# Patient Record
Sex: Male | Born: 1950 | Race: Black or African American | Hispanic: No | Marital: Single | Smoking: Current every day smoker
Health system: Southern US, Community
[De-identification: ages and names within clinical notes are randomized; demographics above are authoritative.]

## PROBLEM LIST (undated history)

## (undated) DIAGNOSIS — M549 Dorsalgia, unspecified: Secondary | ICD-10-CM

## (undated) DIAGNOSIS — I10 Essential (primary) hypertension: Secondary | ICD-10-CM

## (undated) HISTORY — PX: BACK SURGERY: SHX140

## (undated) HISTORY — PX: TOTAL HIP ARTHROPLASTY: SHX124

## (undated) HISTORY — PX: COLONOSCOPY: SHX174

---

## 2014-10-05 ENCOUNTER — Encounter (HOSPITAL_COMMUNITY): Payer: Self-pay | Admitting: *Deleted

## 2014-10-05 ENCOUNTER — Emergency Department (HOSPITAL_COMMUNITY)
Admission: EM | Admit: 2014-10-05 | Discharge: 2014-10-05 | Disposition: A | Discharge status: Home / Self Care | Payer: Medicaid - Out of State | Attending: Emergency Medicine | Admitting: Emergency Medicine

## 2014-10-05 ENCOUNTER — Emergency Department (HOSPITAL_COMMUNITY): Payer: Medicaid - Out of State

## 2014-10-05 DIAGNOSIS — Z8781 Personal history of (healed) traumatic fracture: Secondary | ICD-10-CM | POA: Insufficient documentation

## 2014-10-05 DIAGNOSIS — I10 Essential (primary) hypertension: Secondary | ICD-10-CM | POA: Diagnosis not present

## 2014-10-05 DIAGNOSIS — Z720 Tobacco use: Secondary | ICD-10-CM | POA: Insufficient documentation

## 2014-10-05 DIAGNOSIS — Z9889 Other specified postprocedural states: Secondary | ICD-10-CM | POA: Insufficient documentation

## 2014-10-05 DIAGNOSIS — M549 Dorsalgia, unspecified: Secondary | ICD-10-CM | POA: Diagnosis present

## 2014-10-05 DIAGNOSIS — M545 Low back pain, unspecified: Secondary | ICD-10-CM

## 2014-10-05 HISTORY — DX: Dorsalgia, unspecified: M54.9

## 2014-10-05 HISTORY — DX: Essential (primary) hypertension: I10

## 2014-10-05 MED ORDER — DIAZEPAM 5 MG PO TABS
5.0000 mg | ORAL_TABLET | Freq: Once | ORAL | Status: AC
  Administered 2014-10-05: 5 mg via ORAL
  Filled 2014-10-05: qty 1

## 2014-10-05 MED ORDER — IBUPROFEN 600 MG PO TABS: 600.0000 mg | ORAL_TABLET | Freq: Three times a day (TID) | ORAL | Status: DC | PRN

## 2014-10-05 MED ORDER — METHOCARBAMOL 500 MG PO TABS: 500.0000 mg | ORAL_TABLET | Freq: Three times a day (TID) | ORAL | Status: DC | PRN

## 2014-10-05 MED ORDER — IBUPROFEN 200 MG PO TABS
600.0000 mg | ORAL_TABLET | Freq: Once | ORAL | Status: AC
  Administered 2014-10-05: 600 mg via ORAL
  Filled 2014-10-05: qty 3

## 2014-10-05 MED ORDER — OXYCODONE-ACETAMINOPHEN 5-325 MG PO TABS
1.0000 | ORAL_TABLET | Freq: Once | ORAL | Status: DC
  Filled 2014-10-05: qty 1

## 2014-10-05 NOTE — ED Provider Notes (Signed)
CSN: [id number]     Arrival date & time 10/05/14  1004 History   First MD Initiated Contact with Patient 10/05/14 1016     Chief Complaint  Patient presents with  . Back Pain      HPI Patient presents with increasing back pain over the past 2 weeks.  He states his pain is more severe on the right.  There is some radiation of his pain down towards his right sciatic notch.  He has not tried any medications for the pain but reports pain with ambulation.  He denies fevers and chills.  He states 2013 he had a lumbar fusion surgery done at an outside hospital.   Past Medical History  Diagnosis Date  . Hypertension   . Back pain    Past Surgical History  Procedure Laterality Date  . Back surgery    . Total hip arthroplasty      bil   No family history on file. History  Substance Use Topics  . Smoking status: Current Every Day Smoker  . Smokeless tobacco: Not on file  . Alcohol Use: No    Review of Systems  All other systems reviewed and are negative.     Allergies  Review of patient's allergies indicates no known allergies.  Home Medications   Prior to Admission medications   Not on File   BP 153/93 mmHg  Pulse 67  Temp(Src) 98.7 F (37.1 C) (Oral)  Resp 18  Ht  (1.702 m)  Wt 205 lb (92.987 kg)  BMI 32.10 kg/m2  SpO2 95% Physical Exam  Constitutional: He is oriented to person, place, and time. He appears well-developed and well-nourished.  HENT:  Head: Normocephalic and atraumatic.  Eyes: EOM are normal.  Neck: Normal range of motion.  Cardiovascular: Normal rate, regular rhythm, normal heart sounds and intact distal pulses.   Pulmonary/Chest: Effort normal and breath sounds normal. No respiratory distress.  Abdominal: Soft. He exhibits no distension. There is no tenderness.  Musculoskeletal: Normal range of motion.  Mild tenderness of the right paralumbar region with spasm.  Normal strength in bilateral arms and legs.  Neurological: He is alert and  oriented to person, place, and time.  Skin: Skin is warm and dry.  Psychiatric: He has a normal mood and affect. Judgment normal.  Nursing note and vitals reviewed.   ED Course  Procedures (including critical care time) Labs Review Labs Reviewed - No data to display  Imaging Review Dg Lumbar Spine Complete  10/05/2014   CLINICAL DATA:  Initial encounter for 2 week history of right-sided low back pain radiating over towards the left hip.  EXAM: LUMBAR SPINE - COMPLETE 4+ VIEW  COMPARISON:  None.  FINDINGS: Four views study shows no fracture. No subluxation. Patient is status post posterior diskectomy and interbody fusion at L4-5. One of the L5 pedicle screws appears fractured on the lateral projection although the 2 screws are directly superimposed so laterality of the fractured screw cannot be determined. Bones are diffusely demineralized  IMPRESSION: No evidence for an acute fracture.  No subluxation.  Probable fracture of the L5 pedicle screw seen only on the lateral projection. Laterality of the screw defect cannot be determined on this study.   Electronically Signed   By: Kennith Center M.D.   On: 10/05/2014 11:53     EKG Interpretation None      MDM   Final diagnoses:  None    Suspect musculoskeletal back pain.  He does have a  noted probable fracture of his L5 pedicle screw however it appears as though the screw is otherwise located.  He has no distal neurologic symptoms.  Plan will be to discharge home with pain medicine, and I spasm medicine, primary care and neurosurgical follow-up.  He understands return to the ER for new or worsening symptoms.  No indication for advanced imaging.  Doubt infection.  Marland Kitchen.  Lyanne CoKevin M Kathlynn Swofford, MD 10/05/14 [phone]

## 2014-10-05 NOTE — Discharge Instructions (Signed)
Back Pain, Adult Low back pain is very common. About 1 in 5 people have back pain.The cause of low back pain is rarely dangerous. The pain often gets better over time.About half of people with a sudden onset of back pain feel better in just 2 weeks. About 8 in 10 people feel better by 6 weeks.  CAUSES Some common causes of back pain include:  Strain of the muscles or ligaments supporting the spine.  Wear and tear (degeneration) of the spinal discs.  Arthritis.  Direct injury to the back. DIAGNOSIS Most of the time, the direct cause of low back pain is not known.However, back pain can be treated effectively even when the exact cause of the pain is unknown.Answering your caregiver's questions about your overall health and symptoms is one of the most accurate ways to make sure the cause of your pain is not dangerous. If your caregiver needs more information, he or she may order lab work or imaging tests (X-rays or MRIs).However, even if imaging tests show changes in your back, this usually does not require surgery. HOME CARE INSTRUCTIONS For many people, back pain returns.Since low back pain is rarely dangerous, it is often a condition that people can learn to manageon their own.   Remain active. It is stressful on the back to sit or stand in one place. Do not sit, drive, or stand in one place for more than 30 minutes at a time. Take short walks on level surfaces as soon as pain allows.Try to increase the length of time you walk each day.  Do not stay in bed.Resting more than 1 or 2 days can delay your recovery.  Do not avoid exercise or work.Your body is made to move.It is not dangerous to be active, even though your back may hurt.Your back will likely heal faster if you return to being active before your pain is gone.  Pay attention to your body when you bend and lift. Many people have less discomfortwhen lifting if they bend their knees, keep the load close to their bodies,and  avoid twisting. Often, the most comfortable positions are those that put less stress on your recovering back.  Find a comfortable position to sleep. Use a firm mattress and lie on your side with your knees slightly bent. If you lie on your back, put a pillow under your knees.  Only take over-the-counter or prescription medicines as directed by your caregiver. Over-the-counter medicines to reduce pain and inflammation are often the most helpful.Your caregiver may prescribe muscle relaxant drugs.These medicines help dull your pain so you can more quickly return to your normal activities and healthy exercise.  Put ice on the injured area.  Put ice in a plastic bag.  Place a towel between your skin and the bag.  Leave the ice on for 15-20 minutes, 03-04 times a day for the first 2 to 3 days. After that, ice and heat may be alternated to reduce pain and spasms.  Ask your caregiver about trying back exercises and gentle massage. This may be of some benefit.  Avoid feeling anxious or stressed.Stress increases muscle tension and can worsen back pain.It is important to recognize when you are anxious or stressed and learn ways to manage it.Exercise is a great option. SEEK MEDICAL CARE IF:  You have pain that is not relieved with rest or medicine.  You have pain that does not improve in 1 week.  You have new symptoms.  You are generally not feeling well. SEEK   IMMEDIATE MEDICAL CARE IF:   You have pain that radiates from your back into your legs.  You develop new bowel or bladder control problems.  You have unusual weakness or numbness in your arms or legs.  You develop nausea or vomiting.  You develop abdominal pain.  You feel faint. Document Released: 09/02/2005 Document Revised: 03/03/2012 Document Reviewed: 01/04/2014 Exit[employer] Patient Information 2015 [employer]. This information is not intended to replace advice given to you by your health care provider. Make sure you  discuss any questions you have with your health care provider.  

## 2014-10-05 NOTE — ED Notes (Signed)
Patient has been to xray.  Remains alert and oriented.

## 2014-10-05 NOTE — ED Notes (Signed)
Patient has hx of back surgery 2013.  He has had increasing back pain for 2 weeks.  Patient denies any recent trauma.  Patient was unable to walk today due to pain.  EMS called to home for transport.  Patient has not taken any meds for pain today.  Patient is tearful with any movement.  Patient is alert and oriented.  Patient with no loss of control of bowel or bladder.

## 2016-09-10 ENCOUNTER — Encounter (HOSPITAL_COMMUNITY): Payer: Self-pay | Admitting: Emergency Medicine

## 2016-09-10 ENCOUNTER — Ambulatory Visit (HOSPITAL_COMMUNITY)
Admission: EM | Admit: 2016-09-10 | Discharge: 2016-09-10 | Disposition: A | Discharge status: Home / Self Care | Payer: Medicaid Other | Attending: Emergency Medicine | Admitting: Emergency Medicine

## 2016-09-10 ENCOUNTER — Emergency Department (HOSPITAL_COMMUNITY)
Admission: EM | Admit: 2016-09-10 | Discharge: 2016-09-11 | Disposition: A | Discharge status: Home / Self Care | Payer: Self-pay | Attending: Emergency Medicine | Admitting: Emergency Medicine

## 2016-09-10 ENCOUNTER — Emergency Department (HOSPITAL_COMMUNITY): Payer: Self-pay

## 2016-09-10 DIAGNOSIS — R509 Fever, unspecified: Secondary | ICD-10-CM | POA: Diagnosis not present

## 2016-09-10 DIAGNOSIS — N451 Epididymitis: Secondary | ICD-10-CM | POA: Insufficient documentation

## 2016-09-10 DIAGNOSIS — Z79899 Other long term (current) drug therapy: Secondary | ICD-10-CM | POA: Insufficient documentation

## 2016-09-10 DIAGNOSIS — N50819 Testicular pain, unspecified: Secondary | ICD-10-CM

## 2016-09-10 DIAGNOSIS — N50811 Right testicular pain: Secondary | ICD-10-CM | POA: Diagnosis not present

## 2016-09-10 DIAGNOSIS — F17200 Nicotine dependence, unspecified, uncomplicated: Secondary | ICD-10-CM | POA: Insufficient documentation

## 2016-09-10 DIAGNOSIS — I10 Essential (primary) hypertension: Secondary | ICD-10-CM | POA: Insufficient documentation

## 2016-09-10 DIAGNOSIS — R935 Abnormal findings on diagnostic imaging of other abdominal regions, including retroperitoneum: Secondary | ICD-10-CM | POA: Insufficient documentation

## 2016-09-10 LAB — URINALYSIS, ROUTINE W REFLEX MICROSCOPIC
Bilirubin Urine: NEGATIVE
Glucose, UA: NEGATIVE mg/dL
KETONES UR: NEGATIVE mg/dL
NITRITE: POSITIVE — AB
Protein, ur: 30 mg/dL — AB
Specific Gravity, Urine: 1.019 (ref 1.005–1.030)
pH: 5 (ref 5.0–8.0)

## 2016-09-10 LAB — CBC WITH DIFFERENTIAL/PLATELET
BASOS ABS: 0 10*3/uL (ref 0.0–0.1)
BASOS PCT: 0 %
Eosinophils Absolute: 0 10*3/uL (ref 0.0–0.7)
Eosinophils Relative: 0 %
HCT: 43.4 % (ref 39.0–52.0)
HEMOGLOBIN: 14.6 g/dL (ref 13.0–17.0)
LYMPHS ABS: 2.1 10*3/uL (ref 0.7–4.0)
LYMPHS PCT: 9 %
MCH: 29.6 pg (ref 26.0–34.0)
MCHC: 33.6 g/dL (ref 30.0–36.0)
MCV: 88 fL (ref 78.0–100.0)
Monocytes Absolute: 1.7 10*3/uL — ABNORMAL HIGH (ref 0.1–1.0)
Monocytes Relative: 7 %
NEUTROS ABS: 19.9 10*3/uL — AB (ref 1.7–7.7)
Neutrophils Relative %: 84 %
Platelets: 241 10*3/uL (ref 150–400)
RBC: 4.93 MIL/uL (ref 4.22–5.81)
RDW: 14.7 % (ref 11.5–15.5)
WBC: 23.7 10*3/uL — ABNORMAL HIGH (ref 4.0–10.5)

## 2016-09-10 LAB — BASIC METABOLIC PANEL
ANION GAP: 9 (ref 5–15)
BUN: 13 mg/dL (ref 6–20)
CO2: 21 mmol/L — ABNORMAL LOW (ref 22–32)
Calcium: 9 mg/dL (ref 8.9–10.3)
Chloride: 104 mmol/L (ref 101–111)
Creatinine, Ser: 1.23 mg/dL (ref 0.61–1.24)
GFR calc Af Amer: >60 mL/min (ref >60)
Glucose, Bld: 115 mg/dL — ABNORMAL HIGH (ref 65–99)
POTASSIUM: 3.8 mmol/L (ref 3.5–5.1)
Sodium: 134 mmol/L — ABNORMAL LOW (ref 135–145)

## 2016-09-10 LAB — I-STAT CG4 LACTIC ACID, ED: Lactic Acid, Venous: 1.33 mmol/L (ref 0.5–1.9)

## 2016-09-10 MED ORDER — ACETAMINOPHEN 325 MG PO TABS
ORAL_TABLET | ORAL | Status: AC
  Filled 2016-09-10: qty 1

## 2016-09-10 MED ORDER — KETOROLAC TROMETHAMINE 30 MG/ML IJ SOLN
30.0000 mg | Freq: Once | INTRAMUSCULAR | Status: AC
  Administered 2016-09-10: 30 mg via INTRAVENOUS
  Filled 2016-09-10: qty 1

## 2016-09-10 MED ORDER — DEXTROSE 5 % IV SOLN
1.0000 g | Freq: Once | INTRAVENOUS | Status: AC
  Administered 2016-09-10: 1 g via INTRAVENOUS
  Filled 2016-09-10: qty 10

## 2016-09-10 MED ORDER — NAPROXEN 500 MG PO TABS: 500.0000 mg | ORAL_TABLET | Freq: Two times a day (BID) | ORAL | 0 refills | Status: DC | PRN

## 2016-09-10 MED ORDER — ACETAMINOPHEN 325 MG PO TABS
650.0000 mg | ORAL_TABLET | Freq: Once | ORAL | Status: AC
  Administered 2016-09-10: 650 mg via ORAL

## 2016-09-10 MED ORDER — LEVOFLOXACIN 500 MG PO TABS: 500.0000 mg | ORAL_TABLET | Freq: Every day | ORAL | 0 refills | Status: DC

## 2016-09-10 MED ORDER — IOPAMIDOL (ISOVUE-300) INJECTION 61%
INTRAVENOUS | Status: AC
  Administered 2016-09-10: 100 mL
  Filled 2016-09-10: qty 100

## 2016-09-10 MED ORDER — SODIUM CHLORIDE 0.9 % IV BOLUS (SEPSIS)
1000.0000 mL | Freq: Once | INTRAVENOUS | Status: AC
  Administered 2016-09-10: 1000 mL via INTRAVENOUS

## 2016-09-10 MED ORDER — ACETAMINOPHEN 325 MG PO TABS
ORAL_TABLET | ORAL | Status: AC
  Filled 2016-09-10: qty 2

## 2016-09-10 NOTE — ED Provider Notes (Signed)
[facility]    CSN: [id number] Arrival date & time: 09/10/16  1455     History   Chief Complaint Chief Complaint  Patient presents with  . Back Pain  . Testicle Pain    HPI Austin [name] is a 65 y.o. male.   HPI  He is a 65 year old man here with his wife for evaluation of right low back and right testicular pain. This started about 3 days ago and has gradually been getting worse. Last night, he felt feverish and had vomiting. He's had some intermittent nausea since that time, but no further vomiting. Pain is located in the right lower back, right lower abdomen, and right groin and testicle. He denies any dysuria or penile drainage. No change in the color of his urine. No new sexual partners. He is unable to flex the hip without pain. He is also unable to stand upright due to the pain.  Past Medical History:  Diagnosis Date  . Back pain   . Hypertension     There are no active problems to display for this patient.   Past Surgical History:  Procedure Laterality Date  . BACK SURGERY    . TOTAL HIP ARTHROPLASTY     bil       Home Medications    Prior to Admission medications   Not on File    Family History History reviewed. No pertinent family history.  Social History Social History  Substance Use Topics  . Smoking status: Current Every Day Smoker  . Smokeless tobacco: Not on file  . Alcohol use No     Allergies   Patient has no known allergies.   Review of Systems Review of Systems As in history of present illness  Physical Exam Triage Vital Signs ED Triage Vitals  Enc Vitals Group     BP 09/10/16 1535 149/70     Pulse Rate 09/10/16 1535 108     Resp 09/10/16 1535 20     Temp 09/10/16 1535 102.6 F (39.2 C)     Temp Source 09/10/16 1535 Oral     SpO2 09/10/16 1535 98 %     Weight --      Height --      Head Circumference --      Peak Flow --      Pain Score 09/10/16 1538 10     Pain Loc --      Pain Edu? --      Excl. in  GC? --    No data found.   Updated Vital Signs BP 149/70 (BP Location: Right Arm)   Pulse 108   Temp 102.6 F (39.2 C) (Oral)   Resp 20   SpO2 98%   Visual Acuity Right Eye Distance:   Left Eye Distance:   Bilateral Distance:    Right Eye Near:   Left Eye Near:    Bilateral Near:     Physical Exam  Constitutional: He is oriented to person, place, and time. He appears well-developed and well-nourished. He appears distressed.  Cardiovascular: Normal rate.   Pulmonary/Chest: Effort normal.  Abdominal: Soft. He exhibits no mass. There is tenderness (RLQ). There is no rebound and no guarding. Hernia confirmed negative in the right inguinal area (tender over inguinal canal).  Genitourinary: Right testis shows swelling and tenderness (exquisite). Left testis shows no swelling and no tenderness.  Musculoskeletal:  Back: No erythema or edema. No vertebral tenderness or step-offs. No muscle spasm. He is tender in the  right lateral lumbar area.  Neurological: He is alert and oriented to person, place, and time.     UC Treatments / Results  Labs (all labs ordered are listed, but only abnormal results are displayed) Labs Reviewed - No data to display  EKG  EKG Interpretation None       Radiology No results found.  Procedures Procedures (including critical care time)  Medications Ordered in UC Medications  acetaminophen (TYLENOL) tablet 650 mg (650 mg Oral Given 09/10/16 1557)     Initial Impression / Assessment and Plan / UC Course  I have reviewed the triage vital signs and the nursing notes.  Pertinent labs & imaging results that were available during my care of the patient were reviewed by me and considered in my medical decision making (see chart for details).  Clinical Course     Given his pain and fever, concern for infection. Appendicitis, psoas abscess, epididymitis, and incarcerated hernia are all possible. [facility] and evaluation. Tylenol given prior to transfer.  Final Clinical Impressions(s) / UC Diagnoses   Final diagnoses:  Right testicular pain  Fever, unspecified fever cause    New Prescriptions New Prescriptions   No medications on file     Charm RingsErin J Harshal Sirmon, MD 09/10/16 [phone]

## 2016-09-10 NOTE — Discharge Instructions (Signed)
It was my pleasure taking care of you today!  Please take all of your antibiotics until finished! Take naproxen as needed for pain. Put a cold gel packs or bag of ice on the area every few hours for 15 minutes each time for additional pain relief. You were tested for gonorrhea and chlamydia today - you will be notified in approximately 3 days if these results are positive. Follow up with your primary care provider for recheck of symptoms in one week. If you are not feeling better in 2-3 days, please see your regular doctor or return to ER for recheck. Return to ER for fevers not controlled with tylenol/ibuprofen, new or worsening symptoms, any additional concerns.

## 2016-09-10 NOTE — ED Triage Notes (Signed)
[facility] a [facility] of lower back pain and right testicle tenderness x 3 days. [facility] a fever [facility] 102.6 orally and has not taken any antipyretics.

## 2016-09-10 NOTE — ED Provider Notes (Signed)
[facility]   CSN: [id number] Arrival date & time: 09/10/16  1610     History   Chief Complaint Chief Complaint  Patient presents with  . Testicle Pain  . Fever    HPI Austin [name] is a 65 y.o. male.  The history is provided by the patient and medical records. No language interpreter was used.  Testicle Pain  Pertinent negatives include no abdominal pain and no shortness of breath.  Fever   Associated symptoms include vomiting (1x). Pertinent negatives include no congestion and no cough.   Austin [name] is a 65 y.o. male  with a PMH of HTN who presents to the Emergency Department complaining of right low back pain and right testicular pain. Patient states that about 3 days ago, he started noticing a dull pain to his right low back. Pain gradually worsened and the next day he had pain to his right testicle. He took an aspirin with little relief. At 2 AM, he awoke with nausea and had one episode of emesis. He states that he felt a little bit better after throwing up, but was still experiencing right testicular pain. Pain is worse with palpation. He can sometimes manage to find a certain position which is more comfortable than others. He has felt feverish for the last 2-3 days as well. He was seen by the urgent care earlier today who referred him to the emergency department for further workup. He was febrile 102 at urgent care and was given Tylenol. Patient denies dysuria, urinary hesitancy, urinary frequency, penile discharge.  Past Medical History:  Diagnosis Date  . Back pain   . Hypertension     There are no active problems to display for this patient.   Past Surgical History:  Procedure Laterality Date  . BACK SURGERY    . TOTAL HIP ARTHROPLASTY     bil       Home Medications    Prior to Admission medications   Medication Sig Start Date End Date Taking? Authorizing Provider  levofloxacin (LEVAQUIN) 500 MG tablet Take 1 tablet (500 mg total)  by mouth daily. 09/10/16   Chase Picket Deyvi Bonanno, PA-C  naproxen (NAPROSYN) 500 MG tablet Take 1 tablet (500 mg total) by mouth 2 (two) times daily as needed for moderate pain. 09/10/16   Chase Picket Curtina Grills, PA-C    Family History No family history on file.  Social History Social History  Substance Use Topics  . Smoking status: Current Every Day Smoker  . Smokeless tobacco: Not on file  . Alcohol use No     Allergies   Patient has no known allergies.   Review of Systems Review of Systems  Constitutional: Positive for appetite change (Decreased), chills and fever.  HENT: Negative for congestion.   Eyes: Negative for visual disturbance.  Respiratory: Negative for cough and shortness of breath.   Cardiovascular: Negative.   Gastrointestinal: Positive for nausea and vomiting (1x). Negative for abdominal pain.  Genitourinary: Positive for testicular pain. Negative for discharge, dysuria, frequency and penile pain.  Musculoskeletal: Positive for back pain. Negative for neck pain.  Skin: Negative for rash.  Neurological: Negative for weakness.     Physical Exam Updated Vital Signs BP 120/72   Pulse 74   Temp 99.6 F (37.6 C) (Oral)   Resp 18   SpO2 96%   Physical Exam  Constitutional: He is oriented to person, place, and time. He appears well-developed and well-nourished. No distress.  HENT:  Head: Normocephalic and  atraumatic.  Cardiovascular: Normal rate, regular rhythm and normal heart sounds.   No murmur heard. Pulmonary/Chest: Effort normal and breath sounds normal. No respiratory distress.  Abdominal: Soft. Bowel sounds are normal. He exhibits no distension. There is no tenderness.  No CVA tenderness.   Genitourinary:  Genitourinary Comments: Chaperone present for exam. Right testicle exquisitely tender to palpation. No signs of lesion or erythema on the penis or testicles.The penis is nontender. No signs of any inguinal hernias. No signs of any discharge from the  penis.   Neurological: He is alert and oriented to person, place, and time.  Skin: Skin is warm and dry.  Nursing note and vitals reviewed.    ED Treatments / Results  Labs (all labs ordered are listed, but only abnormal results are displayed) Labs Reviewed  CBC WITH DIFFERENTIAL/PLATELET - Abnormal; Notable for the following:       Result Value   WBC 23.7 (*)    Neutro Abs 19.9 (*)    Monocytes Absolute 1.7 (*)    All other components within normal limits  URINALYSIS, ROUTINE W REFLEX MICROSCOPIC - Abnormal; Notable for the following:    APPearance CLOUDY (*)    Hgb urine dipstick MODERATE (*)    Protein, ur 30 (*)    Nitrite POSITIVE (*)    Leukocytes, UA LARGE (*)    Bacteria, UA MANY (*)    Squamous Epithelial / LPF 0-5 (*)    Crystals PRESENT (*)    All other components within normal limits  BASIC METABOLIC PANEL - Abnormal; Notable for the following:    Sodium 134 (*)    CO2 21 (*)    Glucose, Bld 115 (*)    All other components within normal limits  CULTURE, BLOOD (ROUTINE X 2)  CULTURE, BLOOD (ROUTINE X 2)  URINE CULTURE  I-STAT CG4 LACTIC ACID, ED  GC/CHLAMYDIA PROBE AMP ([place]) [facility]    EKG  EKG Interpretation None       Radiology Ct Abdomen Pelvis W Contrast  Result Date: 09/10/2016 CLINICAL DATA:  Testicular pain for 3 days. EXAM: CT ABDOMEN AND PELVIS WITH CONTRAST TECHNIQUE: Multidetector CT imaging of the abdomen and pelvis was performed using the standard protocol following bolus administration of intravenous contrast. CONTRAST:  100 cc Isovue-300 IV COMPARISON:  None. FINDINGS: Lower chest: Motion limited evaluation. Minimal dependent atelectasis. No pleural fluid. Coronary artery calcifications are seen. Hepatobiliary: No focal liver abnormality is seen. No gallstones, gallbladder wall thickening, or biliary dilatation. Pancreas: Unremarkable. No pancreatic ductal dilatation or surrounding inflammatory changes. Spleen: Normal in size  without focal abnormality. Adrenals/Urinary Tract: Mild left adrenal thickening without dominant nodule. Normal right adrenal gland. No hydronephrosis or perinephric edema. Symmetric renal enhancement and excretion on delayed phase imaging. No evidence of urolithiasis, small renal stones may be obscured by IV contrast. Urinary bladder is grossly unremarkable, partially obscured by streak artifact from bilateral hip prostheses. Stomach/Bowel: Stomach is within normal limits. Appendix tentatively identified and normal. No evidence of bowel wall thickening, distention, or inflammatory changes. Sigmoid colonic tortuosity. Vascular/Lymphatic: Advanced calcified and noncalcified atheromatous plaque throughout the abdominal aorta. 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 Distal reconstitution is suspected at the level of the SFA. Portions of pelvic vasculature obscured by streak artifact from surgical hardware. Prominent pampiniform plexus in the right hemiscrotum. The IVC is unremarkable. Borderline enlarged node at the celiac origin measuring 8 mm short axis. Reproductive:  Right hydrocele. Prostate gland is not well evaluated secondary to streak artifact. Other: No free air or free fluid. Musculoskeletal: Post bilateral hip arthroplasties. Posterior fusion L4-L5. Adjacent level disease at L3-L4. IMPRESSION: 1. Right scrotal hydrocele. Prominent right pampiniform plexus. Findings can be seen in the setting of epididymal orchitis. Collateral vessels due to right iliac artery occlusion felt less likely. Scrotal ultrasound could be considered for more detailed evaluation of testicular pathology. 2. 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 There is distal reconstitution suspected at the SFA, however streak artifact from hip prostheses obscures evaluation. Electronically Signed   By: Rubye OaksMelanie   Ehinger M.D.   On: 09/10/2016 23:08    Procedures Procedures (including critical care time)  Medications Ordered in ED Medications  sodium chloride 0.9 % bolus 1,000 mL (0 mLs Intravenous Stopped 09/10/16 2207)  cefTRIAXone (ROCEPHIN) 1 g in dextrose 5 % 50 mL IVPB (0 g Intravenous Stopped 09/10/16 2207)  ketorolac (TORADOL) 30 MG/ML injection 30 mg (30 mg Intravenous Given 09/10/16 2119)  iopamidol (ISOVUE-300) 61 % injection (100 mLs  Contrast Given 09/10/16 2225)     Initial Impression / Assessment and Plan / ED Course  I have reviewed the triage vital signs and the nursing notes.  Pertinent labs & imaging results that were available during my care of the patient were reviewed by me and considered in my medical decision making (see chart for details).  Clinical Course    Doran DurandStephen Radney is a 65 y.o. male who presents to ED from urgent care for further evaluation of right testicular pain and right low back pain. On exam, patient is non-toxic appearing with significant tenderness to the right testicle. No abdominal or CVA tenderness. Labs reviewed: white count elevated at 23.7 with shift of 19.9. Sodium of 134, CO2 21. Urine nitrite + with large leukocytes and too numerous to count white cells. Lactic normal. Case discussed with Dr. Erma HeritageIsaacs - will obtain CT abd/pelvis for further evaluation. Urine and blood cultures obtained. Will also start patient on Rocephin and fluids. Toradol given for pain. CT shows findings c/w epididymal orchitis which is c/w history and exam as well. Discussed results with attending who reviewed CT as well. Discussed treatment  vs. obtaining further ultrasound and patient would like to defer ultrasound at this time. G&C obtained. Will treat with levaquin. PCP follow up encouraged. Strict return precautions discussed with patient and significant other at bedside who express agreement with plan as dictated above. All questions answered.   Final Clinical Impressions(s) /  ED Diagnoses   Final diagnoses:  Testicular pain  Epididymitis    New Prescriptions Discharge Medication List as of 09/10/2016 11:55 PM    START taking these medications   Details  levofloxacin (LEVAQUIN) 500 MG tablet Take 1 tablet (500 mg total) by mouth daily., Starting Tue 09/10/2016, Print    naproxen (NAPROSYN) 500 MG tablet Take 1 tablet (500 mg total) by mouth 2 (two) times daily as needed for moderate pain., Starting Tue 09/10/2016, Print         CIT GroupJaime Pilcher Hudsyn Champine, PA-C 09/11/16 [id number]    Shaune Pollackameron Isaacs, MD 09/11/16 1149

## 2016-09-10 NOTE — Discharge Instructions (Signed)
With the tenderness and swelling of the testicle and your fever, I'm concerned about infection. The best place to have this evaluated is in the emergency room where they can do some imaging such as an ultrasound or a CT scan.

## 2016-09-10 NOTE — ED Triage Notes (Signed)
Pt sent here from urgent care for evaluation of 4 days of right sided testicular pain. Denies penile discharge. Reports he had a temp of 102 at UC and was given tylenol. Temp normal at triage. PT also reports a headache.

## 2016-09-11 LAB — GC/CHLAMYDIA PROBE AMP (~~LOC~~) NOT AT ARMC
CHLAMYDIA, DNA PROBE: NEGATIVE
Neisseria Gonorrhea: NEGATIVE

## 2016-09-11 NOTE — ED Notes (Signed)
Pt stable, understands discharge instructions, and reasons for return.   

## 2016-09-13 LAB — URINE CULTURE

## 2016-09-14 ENCOUNTER — Telehealth: Payer: Self-pay

## 2016-09-14 NOTE — Telephone Encounter (Signed)
Post ED Visit - Positive Culture Follow-up  Culture report reviewed by antimicrobial stewardship pharmacist:  []  Austin [name], Pharm.[name]. []  Austin [name], Pharm.[name]., BCPS []  Austin [name], Pharm.[name]. []  Austin [name], Pharm.[name]., BCPS []  Austin [name], [place][name]., BCPS, AAHIVP []  Austin [name], Pharm.[name]., BCPS, AAHIVP []  Austin [name], Pharm.[name]. []  Austin [name], Austin [name] Positive urine culture Treated with Levofloxaxin, organism sensitive to the same and no further patient follow-up is required at this time.  Austin [name], Austin [name] 09/14/2016, 9:49 AM

## 2016-09-15 LAB — CULTURE, BLOOD (ROUTINE X 2)
Culture: NO GROWTH
Culture: NO GROWTH

## 2017-09-29 ENCOUNTER — Emergency Department (HOSPITAL_COMMUNITY): Payer: Medicare Other

## 2017-09-29 ENCOUNTER — Emergency Department (HOSPITAL_COMMUNITY)
Admission: EM | Admit: 2017-09-29 | Discharge: 2017-09-29 | Disposition: A | Discharge status: Home / Self Care | Payer: Medicare Other | Attending: Emergency Medicine | Admitting: Emergency Medicine

## 2017-09-29 ENCOUNTER — Encounter (HOSPITAL_COMMUNITY): Payer: Self-pay

## 2017-09-29 DIAGNOSIS — Z96643 Presence of artificial hip joint, bilateral: Secondary | ICD-10-CM | POA: Diagnosis not present

## 2017-09-29 DIAGNOSIS — F17210 Nicotine dependence, cigarettes, uncomplicated: Secondary | ICD-10-CM | POA: Diagnosis not present

## 2017-09-29 DIAGNOSIS — M79675 Pain in left toe(s): Secondary | ICD-10-CM | POA: Insufficient documentation

## 2017-09-29 DIAGNOSIS — M79672 Pain in left foot: Secondary | ICD-10-CM | POA: Diagnosis not present

## 2017-09-29 DIAGNOSIS — S99922A Unspecified injury of left foot, initial encounter: Secondary | ICD-10-CM | POA: Diagnosis not present

## 2017-09-29 DIAGNOSIS — I10 Essential (primary) hypertension: Secondary | ICD-10-CM | POA: Insufficient documentation

## 2017-09-29 MED ORDER — CLOTRIMAZOLE-BETAMETHASONE 1-0.05 % EX CREA: TOPICAL_CREAM | CUTANEOUS | 0 refills | Status: DC

## 2017-09-29 MED ORDER — CEPHALEXIN 500 MG PO CAPS: 500.0000 mg | ORAL_CAPSULE | Freq: Three times a day (TID) | ORAL | 0 refills | Status: DC

## 2017-09-29 MED ORDER — IBUPROFEN 600 MG PO TABS: 600.0000 mg | ORAL_TABLET | Freq: Four times a day (QID) | ORAL | 0 refills | Status: DC | PRN

## 2017-09-29 NOTE — Discharge Instructions (Signed)
It was my pleasure taking care of you today!   Apply cream twice daily as directed.  Follow up with the podiatrist listed or a primary care provider for further evaluation.   Return to ER for new or worsening symptoms, any additional concerns.   To find a primary care or specialty doctor please call [phone] or [phone] to access "[facility] Find a Doctor Service."  You may also go on the [facility] website at [email or web address]  There are also multiple [facility] and [facility] practices throughout the [place] that are frequently accepting new patients. You may find a clinic that is close to your home and contact them.  [facility] - [address] [id number] [phone]  [facility] in [place] (also locations in [place] and [place]) - [address] [place] [phone]  [facility] - [address] [place] [phone]

## 2017-09-29 NOTE — ED Notes (Signed)
Declined W/C at D/C and was escorted to lobby by RN. 

## 2017-09-29 NOTE — ED Provider Notes (Signed)
[facility] EMERGENCY [facility] Provider Note   CSN: [id number] Arrival date & time: 09/29/17  1515     History   Chief Complaint Chief Complaint  Patient presents with  . Toe Injury    HPI Austin [name] is a 67 y.o. male.  The history is provided by the patient and medical records. No language interpreter was used.   Austin [name] is a 67 y.o. male  with a PMH of HTN who presents to the Emergency Department complaining of pain to the left great toe times 1.5 weeks.  Patient states it has been a slight ache and tenderness around the distal nailbed area for the last week or so.  Today, he accidentally ran into the bedpost with his foot and pain acutely worsened.  Does report thickening of the nail and white, dry patches around the webbing of the first 3 toes. Denies hx of similar. No medications prior to arrival for symptoms. No fever, chills, numbness, tingling or weakness.  Past Medical History:  Diagnosis Date  . Back pain   . Hypertension     There are no active problems to display for this patient.   Past Surgical History:  Procedure Laterality Date  . BACK SURGERY    . TOTAL HIP ARTHROPLASTY     bil       Home Medications    Prior to Admission medications   Medication Sig Start Date End Date Taking? Authorizing Provider  cephALEXin (KEFLEX) 500 MG capsule Take 1 capsule (500 mg total) by mouth 3 (three) times daily. 09/29/17   Ward, Chase PicketJaime Pilcher, PA-C  clotrimazole-betamethasone (LOTRISONE) cream Apply to affected area 2 times daily 09/29/17   Ward, Chase PicketJaime Pilcher, PA-C  ibuprofen (ADVIL,MOTRIN) 600 MG tablet Take 1 tablet (600 mg total) by mouth every 6 (six) hours as needed. 09/29/17   Ward, Chase PicketJaime Pilcher, PA-C  levofloxacin (LEVAQUIN) 500 MG tablet Take 1 tablet (500 mg total) by mouth daily. 09/10/16   Ward, Chase PicketJaime Pilcher, PA-C  naproxen (NAPROSYN) 500 MG tablet Take 1 tablet (500 mg total) by mouth 2 (two) times daily as needed for moderate  pain. 09/10/16   Ward, Chase PicketJaime Pilcher, PA-C    Family History No family history on file.  Social History Social History   Tobacco Use  . Smoking status: Current Every Day Smoker  . Smokeless tobacco: Never Used  Substance Use Topics  . Alcohol use: No  . Drug use: Yes    Types: Marijuana     Allergies   Patient has no known allergies.   Review of Systems Review of Systems  Constitutional: Negative for chills and fever.  Musculoskeletal: Positive for arthralgias.  Skin: Positive for color change.  Neurological: Negative for weakness and numbness.     Physical Exam Updated Vital Signs BP (!) 196/97 (BP Location: Right Arm)   Pulse 84   Temp 98 F (36.7 C)   Resp 20   Ht 5\' 7"  (1.702 m)   Wt 88.5 kg (195 lb)   SpO2 95%   BMI 30.54 kg/m   Physical Exam  Constitutional: He appears well-developed and well-nourished. No distress.  HENT:  Head: Normocephalic and atraumatic.  Neck: Neck supple.  Cardiovascular: Normal rate, regular rhythm and normal heart sounds.  No murmur heard. Pulmonary/Chest: Effort normal and breath sounds normal. No respiratory distress. He has no wheezes. He has no rales.  Musculoskeletal:  Tenderness along distal left great toe nailbed. Mild erythema. No swelling. No fluctuance. No abscess /  paronychia. Thickened nails. Full ROM. 2+ DP. Sensation intact.  Neurological: He is alert.  Skin: Skin is warm and dry.  Nursing note and vitals reviewed.    ED Treatments / Results  Labs (all labs ordered are listed, but only abnormal results are displayed) Labs Reviewed - No data to display  EKG  EKG Interpretation None       Radiology Dg Foot Complete Left  Result Date: 09/29/2017 CLINICAL DATA:  Left foot pain.  Toe injury EXAM: LEFT FOOT - COMPLETE 3+ VIEW COMPARISON:  None FINDINGS: Hallux valgus deformity identified. Mild degenerative changes at the first MTP joint. Second through fifth hammertoe deformity identified. IMPRESSION:  1. No acute findings. 2. Hallux valgus deformity 3. Second through fifth hammertoe deformities Electronically Signed   By: Signa Kell M.D.   On: 09/29/2017 17:37    Procedures Procedures (including critical care time)  Medications Ordered in ED Medications - No data to display   Initial Impression / Assessment and Plan / ED Course  I have reviewed the triage vital signs and the nursing notes.  Pertinent labs & imaging results that were available during my care of the patient were reviewed by me and considered in my medical decision making (see chart for details).    Austin [name] is a 67 y.o. male who presents to ED for left great toenail pain x 1.5 weeks. Did hit toe today which exacerbated pain. NVI. X-ray with no acute findings. Tinea on exam. Also has some mild erythema and tenderness at nailfold concerning for early superimposed infection. Will treat and have patient follow up with PCP or podiatry. Info for both provided. Home care instructions and return precautions discussed. All questions answered.   Final Clinical Impressions(s) / ED Diagnoses   Final diagnoses:  Pain of toe of left foot    ED Discharge Orders        Ordered    clotrimazole-betamethasone (LOTRISONE) cream     09/29/17 1809    cephALEXin (KEFLEX) 500 MG capsule  3 times daily     09/29/17 1809    ibuprofen (ADVIL,MOTRIN) 600 MG tablet  Every 6 hours PRN     09/29/17 1814       Ward, Chase Picket, PA-C 09/29/17 1825    Jacalyn Lefevre, MD 09/29/17 1846

## 2017-09-29 NOTE — ED Triage Notes (Signed)
Per Pt, Pt is coming from home with complaints of left toe pain secondary to hitting it on the bed post. Pt reports he had some pain before, but this is worse.

## 2017-10-08 ENCOUNTER — Emergency Department (HOSPITAL_COMMUNITY)
Admission: EM | Admit: 2017-10-08 | Discharge: 2017-10-08 | Disposition: A | Discharge status: Home / Self Care | Payer: Medicare Other | Attending: Emergency Medicine | Admitting: Emergency Medicine

## 2017-10-08 ENCOUNTER — Other Ambulatory Visit: Payer: Self-pay

## 2017-10-08 ENCOUNTER — Encounter (HOSPITAL_COMMUNITY): Payer: Self-pay | Admitting: Emergency Medicine

## 2017-10-08 DIAGNOSIS — M79672 Pain in left foot: Secondary | ICD-10-CM

## 2017-10-08 DIAGNOSIS — I10 Essential (primary) hypertension: Secondary | ICD-10-CM | POA: Diagnosis not present

## 2017-10-08 DIAGNOSIS — B353 Tinea pedis: Secondary | ICD-10-CM | POA: Diagnosis not present

## 2017-10-08 MED ORDER — KETOROLAC TROMETHAMINE 30 MG/ML IJ SOLN
30.0000 mg | Freq: Once | INTRAMUSCULAR | Status: AC
  Administered 2017-10-08: 30 mg via INTRAMUSCULAR
  Filled 2017-10-08: qty 1

## 2017-10-08 MED ORDER — GABAPENTIN 100 MG PO CAPS: 100.0000 mg | ORAL_CAPSULE | Freq: Three times a day (TID) | ORAL | 0 refills | Status: DC

## 2017-10-08 MED ORDER — MELOXICAM 15 MG PO TABS: 15.0000 mg | ORAL_TABLET | Freq: Every day | ORAL | 0 refills | Status: DC

## 2017-10-08 NOTE — Discharge Instructions (Signed)
Perform warm soapy water soaks in Half-strength hydrogen peroxide for 5-10 mins, twice daily x 5-7 days. Use mobic daily as directed, don't use additional NSAIDs like ibuprofen/aleve/etc while taking mobic. Use additional tylenol as needed for additional pain relief.  Use gabapentin as directed for pain. Continue using the cream you were given at your last visit, to continue treating your fungal infection. Please followup with the [facility] wellness center in 1-2 weeks for recheck of symptoms and ongoing medical care. Watch for increasing pain, worsening swelling, spreading redness, drainage/pus, or fever, and return to the emergency department if any of these symptoms occur, or for any other changes/worsening.  Also, your blood pressure was elevated; monitor your blood pressure at home and keep a log to take to your doctor's visit. Eat a low sodium/low salt diet.

## 2017-10-08 NOTE — ED Notes (Signed)
Pt stable, ambulatory, and verbalizes understanding of d/c instructions.  

## 2017-10-08 NOTE — ED Triage Notes (Signed)
Pt to ER for evaluation of left foot pain x1 week; states was seen last week for the same. Mild swelling noted. States toe injury last week. Has been on antibiotics.

## 2017-10-08 NOTE — ED Provider Notes (Signed)
[facility] EMERGENCY DEPARTMENT Provider Note   CSN: [id number] Arrival date & time: 10/08/17  1324     History   Chief Complaint Chief Complaint  Patient presents with  . Foot Pain    HPI Austin [name] is a 67 y.o. male with a PMHx of HTN and chronic back pain, who presents to the ED with complaints of ongoing left toe pain x 2 weeks.  Patient was seen on 09/29/17 and diagnosed with tinea and possible early superimposed infection, was given Lotrisone cream and Keflex.  He has been compliant with these however feels as though his issue continues.  He had an x-ray on the day of his last visit, which was negative.  He describes his pain today as 10/10 constant throbbing and stinging/tingling left first through third toe pain, nonradiating, worse with pressure applied to the area, and mildly improved with ibuprofen and aspirin.  He reports associated swelling, erythema, warmth, and itching.  He states that there is a white patch on 1 of his toes as well.  He occasionally has burning when using Epsom salt and when using the Lotrisone cream.  He denies any family or personal history of gout.  Denies history of diabetes.  Of note, he is not on blood pressure medicine and has not been in several years, cannot recall what he was on before.  He denies any fevers, chills, HA, vision changes, LE swelling, CP, SOB, abd pain, N/V/D/C, hematuria, dysuria, myalgias, numbness, tingling, focal weakness, or any other complaints at this time.    The history is provided by the patient and medical records. No language interpreter was used.  Foot Pain  This is a new problem. The current episode started more than 1 week ago. The problem occurs constantly. The problem has not changed since onset.Pertinent negatives include no chest pain, no abdominal pain, no headaches and no shortness of breath. Exacerbated by: touching the area. The symptoms are relieved by NSAIDs and ASA. He has tried ASA (and  ibuprofen) for the symptoms. The treatment provided mild relief.    Past Medical History:  Diagnosis Date  . Back pain   . Hypertension     There are no active problems to display for this patient.   Past Surgical History:  Procedure Laterality Date  . BACK SURGERY    . TOTAL HIP ARTHROPLASTY     bil       Home Medications    Prior to Admission medications   Medication Sig Start Date End Date Taking? Authorizing Provider  cephALEXin (KEFLEX) 500 MG capsule Take 1 capsule (500 mg total) by mouth 3 (three) times daily. 09/29/17  Yes Ward, Chase Picket, PA-C  clotrimazole-betamethasone (LOTRISONE) cream Apply to affected area 2 times daily 09/29/17  Yes Ward, Chase Picket, PA-C  ibuprofen (ADVIL,MOTRIN) 600 MG tablet Take 1 tablet (600 mg total) by mouth every 6 (six) hours as needed. 09/29/17  Yes Ward, Chase Picket, PA-C  levofloxacin (LEVAQUIN) 500 MG tablet Take 1 tablet (500 mg total) by mouth daily. Patient not taking: Reported on 10/08/2017 09/10/16   Ward, Chase Picket, PA-C  naproxen (NAPROSYN) 500 MG tablet Take 1 tablet (500 mg total) by mouth 2 (two) times daily as needed for moderate pain. Patient not taking: Reported on 10/08/2017 09/10/16   Ward, Chase Picket, PA-C    Family History History reviewed. No pertinent family history.  Social History Social History   Tobacco Use  . Smoking status: Current Every Day Smoker  .  Smokeless tobacco: Never Used  Substance Use Topics  . Alcohol use: No  . Drug use: Yes    Types: Marijuana     Allergies   Patient has no known allergies.   Review of Systems Review of Systems  Constitutional: Negative for chills and fever.  Eyes: Negative for visual disturbance.  Respiratory: Negative for shortness of breath.   Cardiovascular: Negative for chest pain and leg swelling.  Gastrointestinal: Negative for abdominal pain, constipation, diarrhea, nausea and vomiting.  Genitourinary: Negative for dysuria and hematuria.   Musculoskeletal: Positive for arthralgias and joint swelling. Negative for myalgias.  Skin: Positive for color change.  Allergic/Immunologic: Negative for immunocompromised state.  Neurological: Negative for weakness, numbness and headaches.  Psychiatric/Behavioral: Negative for confusion.   All other systems reviewed and are negative for acute change except as noted in the HPI.    Physical Exam Updated Vital Signs BP (!) 200/102 (BP Location: Left Arm)   Pulse 66   Temp 97.9 F (36.6 C) (Oral)   Resp 18   SpO2 99% Exam VS: BP (!) 153/108   Pulse 72   Temp 97.9 F (36.6 C) (Oral)   Resp 18   SpO2 97%     Physical Exam  Constitutional: He is oriented to person, place, and time. Vital signs are normal. He appears well-developed and well-nourished.  Non-toxic appearance. No distress.  Afebrile, nontoxic, NAD  HENT:  Head: Normocephalic and atraumatic.  Mouth/Throat: Mucous membranes are normal.  Eyes: Conjunctivae and EOM are normal. Right eye exhibits no discharge. Left eye exhibits no discharge.  Neck: Normal range of motion. Neck supple.  Cardiovascular: Normal rate and intact distal pulses.  Pulmonary/Chest: Effort normal. No respiratory distress.  Abdominal: Normal appearance. He exhibits no distension.  Musculoskeletal: Normal range of motion.       Left foot: There is tenderness. There is normal range of motion, no bony tenderness, no swelling, normal capillary refill, no crepitus, no deformity and no laceration.  L foot with FROM intact in all toes, hypertrophied yellowed toenails on all toes, white patch/scale on 2nd toe at PIP joint likely callous from hammertoe deformities. Hallux valgus deformity of 1st toe. No erythema or warmth, no swelling, no crepitus or deformity, with diffuse TTP across toenails of 1st-3rd toes, particularly on the great toe. No paronychia. Strength and sensation grossly intact, distal pulses intact, compartments soft.   Neurological: He is alert  and oriented to person, place, and time. He has normal strength. No sensory deficit.  Skin: Skin is warm, dry and intact. No rash noted.  Psychiatric: He has a normal mood and affect.  Nursing note and vitals reviewed.    ED Treatments / Results  Labs (all labs ordered are listed, but only abnormal results are displayed) Labs Reviewed - No data to display  EKG  EKG Interpretation None       Radiology No results found.   L foot xray 09/29/17: Study Result  CLINICAL DATA:  Left foot pain.  Toe injury  EXAM: LEFT FOOT - COMPLETE 3+ VIEW  COMPARISON:  None  FINDINGS: Hallux valgus deformity identified. Mild degenerative changes at the first MTP joint. Second through fifth hammertoe deformity identified.  IMPRESSION: 1. No acute findings. 2. Hallux valgus deformity 3. Second through fifth hammertoe deformities   Electronically Signed   By: Signa Kellaylor  Stroud M.D.   On: 09/29/2017 17:37     Procedures Procedures (including critical care time)  Medications Ordered in ED Medications  ketorolac (TORADOL) 30  MG/ML injection 30 mg (30 mg Intramuscular Given 10/08/17 1103)     Initial Impression / Assessment and Plan / ED Course  I have reviewed the triage vital signs and the nursing notes.  Pertinent labs & imaging results that were available during my care of the patient were reviewed by me and considered in my medical decision making (see chart for details).     67 y.o. male here with ongoing left foot/toe pain.  Was seen on 09/29/17 for similar complaints, had an x-ray which was negative, was treated for tinea and presumptively treated for early superimposed infection using Keflex.  He has taken these medications and used the cream as prescribed but denies any improvement.  On exam, hypertrophied yellowed toenails on every toe, particularly prominent on the great toe, no surrounding erythema or warmth, no swelling, mild tenderness on the toenails, no evidence of  paronychia or superimposed infection.  I suspect tinea pedis is primarily the issue, and that his hypertrophied nails are painful.  He will likely need prolonged oral antifungals, but given that we have no baseline labs for him, will defer starting these today and instead refer him to the wellness center to establish medical care so he can have lab work and likely be started on oral antifungals.  He will likely need to be on these for a prolonged period of time in order to treat this.  He will need to follow-up with podiatry, however he is concerned because he has no insurance, therefore I think referring him to the wellness center first would be beneficial in order to be able to get financial assistance in order to see podiatry.  Given that he has some component of neuropathy type pain, will start on low-dose gabapentin.  We will also start on Mobic.  Advised warm water soaks and follow-up with the wellness center.  Of note, patient was very hypertensive on arrival, which improved during his ED course, he is asymptomatic therefore doubt need for further intervention or emergent evaluation.  Advised DASH diet and monitoring BP at home, follow-up with wellness center on this as well.  I explained the diagnosis and have given explicit precautions to return to the ER including for any other new or worsening symptoms. The patient understands and accepts the medical plan as it's been dictated and I have answered their questions. Discharge instructions concerning home care and prescriptions have been given. The patient is STABLE and is discharged to home in good condition.    Final Clinical Impressions(s) / ED Diagnoses   Final diagnoses:  Left foot pain  Tinea pedis of left foot  Essential hypertension    ED Discharge Orders        Ordered    gabapentin (NEURONTIN) 100 MG capsule  3 times daily     10/08/17 1106    meloxicam (MOBIC) 15 MG tablet  Daily     10/08/17 [address] [place],  [place] 10/08/17 1116    Vanetta Mulders, MD 10/08/17 1718

## 2017-10-20 DIAGNOSIS — S90112A Contusion of left great toe without damage to nail, initial encounter: Secondary | ICD-10-CM | POA: Diagnosis not present

## 2017-10-20 DIAGNOSIS — Z0000 Encounter for general adult medical examination without abnormal findings: Secondary | ICD-10-CM | POA: Diagnosis not present

## 2017-10-20 DIAGNOSIS — M79675 Pain in left toe(s): Secondary | ICD-10-CM | POA: Diagnosis not present

## 2017-10-20 DIAGNOSIS — Z1389 Encounter for screening for other disorder: Secondary | ICD-10-CM | POA: Diagnosis not present

## 2017-10-20 DIAGNOSIS — M79671 Pain in right foot: Secondary | ICD-10-CM | POA: Diagnosis not present

## 2017-10-20 DIAGNOSIS — L600 Ingrowing nail: Secondary | ICD-10-CM | POA: Diagnosis not present

## 2017-10-20 DIAGNOSIS — B351 Tinea unguium: Secondary | ICD-10-CM | POA: Diagnosis not present

## 2017-10-21 ENCOUNTER — Inpatient Hospital Stay: Payer: Medicare Other

## 2017-11-19 ENCOUNTER — Emergency Department (HOSPITAL_COMMUNITY): Payer: Medicare Other

## 2017-11-19 ENCOUNTER — Emergency Department (HOSPITAL_BASED_OUTPATIENT_CLINIC_OR_DEPARTMENT_OTHER)
Admit: 2017-11-19 | Discharge: 2017-11-19 | Disposition: A | Discharge status: Home / Self Care | Payer: Medicare Other | Attending: Emergency Medicine | Admitting: Emergency Medicine

## 2017-11-19 ENCOUNTER — Encounter (HOSPITAL_COMMUNITY): Payer: Self-pay

## 2017-11-19 ENCOUNTER — Other Ambulatory Visit: Payer: Self-pay

## 2017-11-19 ENCOUNTER — Emergency Department (HOSPITAL_COMMUNITY)
Admission: EM | Admit: 2017-11-19 | Discharge: 2017-11-19 | Disposition: A | Discharge status: Home / Self Care | Payer: Medicare Other | Attending: Emergency Medicine | Admitting: Emergency Medicine

## 2017-11-19 DIAGNOSIS — M79672 Pain in left foot: Secondary | ICD-10-CM | POA: Diagnosis present

## 2017-11-19 DIAGNOSIS — Z79899 Other long term (current) drug therapy: Secondary | ICD-10-CM | POA: Insufficient documentation

## 2017-11-19 DIAGNOSIS — I10 Essential (primary) hypertension: Secondary | ICD-10-CM | POA: Insufficient documentation

## 2017-11-19 DIAGNOSIS — M79609 Pain in unspecified limb: Secondary | ICD-10-CM

## 2017-11-19 DIAGNOSIS — Z96643 Presence of artificial hip joint, bilateral: Secondary | ICD-10-CM | POA: Insufficient documentation

## 2017-11-19 DIAGNOSIS — L03116 Cellulitis of left lower limb: Secondary | ICD-10-CM

## 2017-11-19 DIAGNOSIS — F17210 Nicotine dependence, cigarettes, uncomplicated: Secondary | ICD-10-CM | POA: Insufficient documentation

## 2017-11-19 DIAGNOSIS — M19072 Primary osteoarthritis, left ankle and foot: Secondary | ICD-10-CM | POA: Diagnosis not present

## 2017-11-19 LAB — BASIC METABOLIC PANEL
Anion gap: 8 (ref 5–15)
BUN: 15 mg/dL (ref 6–20)
CALCIUM: 8.8 mg/dL — AB (ref 8.9–10.3)
CO2: 23 mmol/L (ref 22–32)
CREATININE: 0.99 mg/dL (ref 0.61–1.24)
Chloride: 108 mmol/L (ref 101–111)
GFR calc Af Amer: >60 mL/min (ref >60)
Glucose, Bld: 99 mg/dL (ref 65–99)
POTASSIUM: 4.1 mmol/L (ref 3.5–5.1)
SODIUM: 139 mmol/L (ref 135–145)

## 2017-11-19 LAB — CBC WITH DIFFERENTIAL/PLATELET
Basophils Absolute: 0.1 10*3/uL (ref 0.0–0.1)
Basophils Relative: 1 %
EOS ABS: 0.1 10*3/uL (ref 0.0–0.7)
EOS PCT: 2 %
HCT: 40.5 % (ref 39.0–52.0)
Hemoglobin: 13.7 g/dL (ref 13.0–17.0)
LYMPHS ABS: 2.4 10*3/uL (ref 0.7–4.0)
Lymphocytes Relative: 26 %
MCH: 30.6 pg (ref 26.0–34.0)
MCHC: 33.8 g/dL (ref 30.0–36.0)
MCV: 90.4 fL (ref 78.0–100.0)
Monocytes Absolute: 0.6 10*3/uL (ref 0.1–1.0)
Monocytes Relative: 7 %
Neutro Abs: 6.2 10*3/uL (ref 1.7–7.7)
Neutrophils Relative %: 66 %
PLATELETS: 238 10*3/uL (ref 150–400)
RBC: 4.48 MIL/uL (ref 4.22–5.81)
RDW: 14.9 % (ref 11.5–15.5)
WBC: 9.4 10*3/uL (ref 4.0–10.5)

## 2017-11-19 LAB — BRAIN NATRIURETIC PEPTIDE: B NATRIURETIC PEPTIDE 5: 73.8 pg/mL (ref 0.0–100.0)

## 2017-11-19 MED ORDER — OXYCODONE-ACETAMINOPHEN 5-325 MG PO TABS
1.0000 | ORAL_TABLET | Freq: Once | ORAL | Status: AC
  Administered 2017-11-19: 1 via ORAL
  Filled 2017-11-19: qty 1

## 2017-11-19 MED ORDER — DOXYCYCLINE HYCLATE 100 MG PO CAPS: 100.0000 mg | ORAL_CAPSULE | Freq: Two times a day (BID) | ORAL | 0 refills | Status: DC

## 2017-11-19 MED ORDER — OXYCODONE-ACETAMINOPHEN 5-325 MG PO TABS: 1.0000 | ORAL_TABLET | ORAL | 0 refills | Status: DC | PRN

## 2017-11-19 NOTE — Progress Notes (Signed)
Left lower extremity venous duplex has been completed. Negative for DVT. Results were given to Dr. Effie ShyWentz.  11/19/17 12:24 PM Olen CordialGreg Avynn Klassen RVT

## 2017-11-19 NOTE — ED Notes (Signed)
Patient transported to X-ray 

## 2017-11-19 NOTE — ED Triage Notes (Signed)
Patient complains of ongoing left foot and great toe pain since [place] in [place]. States that the pain radiating up leg

## 2017-11-19 NOTE — ED Notes (Signed)
Pt verbalized understanding of discharge instructions and denies any further questions at this time.   

## 2017-11-19 NOTE — ED Notes (Signed)
ED Provider at bedside. 

## 2017-11-19 NOTE — ED Provider Notes (Signed)
[facility] EMERGENCY DEPARTMENT Provider Note   CSN: [id number] Arrival date & time: 11/19/17  4098     History   Chief Complaint No chief complaint on file.   HPI Khole Branch is a 67 y.o. male with history of hypertension see her for evaluation of left big toe pain that radiates up into his left leg and into the left groin, pain began 2 weeks ago and it has been gradually getting worse. Associated symptoms include swelling to the left lower extremity. Patient states he was told he had toe fungus in the left great toe approximately 1-2 months ago here in the emergency department.  2 weeks ago however the pain in the left great toe became worse and [facility] [place] [facility] his left great toenail, states he was discharged with meloxicam that he was unable to afford. Over the last couple of weeks since the toenail removal the pain, swelling has gotten worse to the point where he can't wear regular shoes due to pain and swelling. He woke up this morning and noticed a "knot" to the left groin. He has been intermittently having to use a cane for walking due to the pain. Has taken ibuprofen without relief. He is relocating from [place] and has been traveling back and forth in the car/bus multiple times in the last several weeks. Travel time is approximately 8-10 hours.   States the pain is in the left great toe up into the foot but denies any calf pain. Denies focal redness or warmth. No fevers or chills. No history of IV drug use. History of kidney insufficiency or heart failure. No history of DVT/PE.  HPI  Past Medical History:  Diagnosis Date  . Back pain   . Hypertension     There are no active problems to display for this patient.   Past Surgical History:  Procedure Laterality Date  . BACK SURGERY    . TOTAL HIP ARTHROPLASTY     bil       Home Medications    Prior to Admission medications   Medication Sig Start Date End  Date Taking? Authorizing Provider  cephALEXin (KEFLEX) 500 MG capsule Take 1 capsule (500 mg total) by mouth 3 (three) times daily. 09/29/17   Ward, Chase Picket, PA-C  clotrimazole-betamethasone (LOTRISONE) cream Apply to affected area 2 times daily 09/29/17   Ward, Chase Picket, PA-C  doxycycline (VIBRAMYCIN) 100 MG capsule Take 1 capsule (100 mg total) by mouth 2 (two) times daily. 11/19/17   Liberty Handy, PA-C  gabapentin (NEURONTIN) 100 MG capsule Take 1 capsule (100 mg total) by mouth 3 (three) times daily. 10/08/17   Street, Mercedes, PA-C  ibuprofen (ADVIL,MOTRIN) 600 MG tablet Take 1 tablet (600 mg total) by mouth every 6 (six) hours as needed. 09/29/17   Ward, Chase Picket, PA-C  levofloxacin (LEVAQUIN) 500 MG tablet Take 1 tablet (500 mg total) by mouth daily. Patient not taking: Reported on 10/08/2017 09/10/16   Ward, Chase Picket, PA-C  meloxicam (MOBIC) 15 MG tablet Take 1 tablet (15 mg total) by mouth daily. TAKE WITH MEALS 10/08/17   Street, [place], PA-C  naproxen (NAPROSYN) 500 MG tablet Take 1 tablet (500 mg total) by mouth 2 (two) times daily as needed for moderate pain. Patient not taking: Reported on 10/08/2017 09/10/16   Ward, Chase Picket, PA-C  oxyCODONE-acetaminophen (PERCOCET/ROXICET) 5-325 MG tablet Take 1 tablet by mouth every 4 (four) hours as needed for  severe pain. 11/19/17   Liberty HandyGibbons, Shela Esses J, PA-C    Family History No family history on file.  Social History Social History   Tobacco Use  . Smoking status: Current Every Day Smoker  . Smokeless tobacco: Never Used  Substance Use Topics  . Alcohol use: No  . Drug use: Yes    Types: Marijuana     Allergies   Patient has no known allergies.   Review of Systems Review of Systems  Cardiovascular: Positive for leg swelling.  Musculoskeletal: Positive for arthralgias, gait problem and joint swelling.  Skin: Positive for wound.  All other systems reviewed and are negative.    Physical Exam Updated  Vital Signs BP (!) 156/87 (BP Location: Right Arm)   Pulse 76   Temp 98.1 F (36.7 C) (Oral)   Resp 17   SpO2 98%   Physical Exam  Constitutional: He is oriented to person, place, and time. He appears well-developed and well-nourished. No distress.  NAD.  HENT:  Head: Normocephalic and atraumatic.  Right Ear: External ear normal.  Left Ear: External ear normal.  Nose: Nose normal.  Eyes: Conjunctivae and EOM are normal. No scleral icterus.  Neck: Normal range of motion. Neck supple.  Cardiovascular: Normal rate, regular rhythm, normal heart sounds and intact distal pulses.  No murmur heard. 2+ radial and DP pulses bilaterally, slightly weaker in L foot secondary to swelling. No calf tenderness. Asymmetric LE edema R>L. There is 2+ pitting edema from left ankle to left knee, 1+ pitting edema from right ankle to right knee. No obvious JVD. No asymmetric edema past knees bilaterally.   Pulmonary/Chest: Effort normal and breath sounds normal. He has no wheezes.  Lungs clear. No crackles. No orthopnea.   Abdominal: Soft. There is no tenderness.  No obvious abdominal distention  Genitourinary:  Genitourinary Comments: Mildly tender left inguinal nodule without overlaying skin changes. This is not noted on right.   Musculoskeletal: Normal range of motion. He exhibits no deformity.  No focal bony tenderness to left ankle. Able to wiggle toes. Painless PROM of the left ankle intact. No TTP into metatarsals bilaterally.   Neurological: He is alert and oriented to person, place, and time.  Skin: Skin is warm and dry. Capillary refill takes less than 2 seconds.  Foul smell from left great toe. Moist macerated skin in between left great toe and 2nd toe. TTP to entire left great toe and up into MTP. See picture.   Psychiatric: He has a normal mood and affect. His behavior is normal. Judgment and thought content normal.  Nursing note and vitals reviewed.            ED Treatments /  Results  Labs (all labs ordered are listed, but only abnormal results are displayed) Labs Reviewed  BASIC METABOLIC PANEL - Abnormal; Notable for the following components:      Result Value   Calcium 8.8 (*)    All other components within normal limits  CBC WITH DIFFERENTIAL/PLATELET  BRAIN NATRIURETIC PEPTIDE    EKG  EKG Interpretation None       Radiology Dg Foot Complete Left  Result Date: 11/19/2017 CLINICAL DATA:  Left foot pain centered about the great toe since a toenail was removed 3-4 months ago. Subsequent encounter. EXAM: LEFT FOOT - COMPLETE 3+ VIEW COMPARISON:  Plain films left foot 09/19/2017. FINDINGS: No bony destructive change or periosteal reaction is identified. No radiopaque foreign body is seen. Hallux valgus deformity and osteoarthritic change about the  first tarsometatarsal joint is noted. Soft tissues of the foot are swollen. No soft tissue gas. IMPRESSION: Soft tissue swelling. Negative for evidence of osteomyelitis. No foreign body. Hallux valgus. 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 Electronically Signed   By: Drusilla Kanner M.D.   On: 11/19/2017 10:23    Procedures Procedures (including critical care time)  Medications Ordered in ED Medications  oxyCODONE-acetaminophen (PERCOCET/ROXICET) 5-325 MG per tablet 1 tablet (1 tablet Oral Given 11/19/17 0950)     Initial Impression / Assessment and Plan / ED Course  I have reviewed the triage vital signs and the nursing notes.  Pertinent labs & imaging results that were available during my care of the patient were reviewed by me and considered in my medical decision making (see chart for details).     Differential includes fungal infection with superimposed cellulitis. No fluctuance to suggest abscess. Also considering DVT. Pt has h/o of HTN but no h/o CHF or renal disease. He has mild edema to RLE. No crackles on lung exam or obvious JVD. He has no cough, SOB, CP. This does not  sound like CHF. Will obtain screening labs, [place] and x-ray. This is his third ED visit for right toe pain/swelling.   Final Clinical Impressions(s) / ED Diagnoses   Labs and imaging reviewed. No leukocytosis. Normal creatinine and BNP. X-ray without signs of bone involvement. [place] negative for LLE DVT. Will discharge with doxycyline, pain control, and wound care. Pt does not have a PCP and is higher risk given age and lack of f/u. Encouraged him to f[facility] with [facility], if unable he is to come to ED for foot/wound check in 3 days. Sooner if signs of worsening infection. Pt shared with Dr Effie Shy.  Final diagnoses:  Cellulitis of foot, left    ED Discharge Orders        Ordered    doxycycline (VIBRAMYCIN) 100 MG capsule  2 times daily     11/19/17 1230    oxyCODONE-acetaminophen (PERCOCET/ROXICET) 5-325 MG tablet  Every 4 hours PRN     11/19/17 1230       Liberty Handy, [place] 11/19/17 1317    Mancel Bale, MD 11/19/17 2235

## 2017-11-19 NOTE — Discharge Instructions (Signed)
Take doxycyline as prescribed. Percocet for pain every 4 hours. Add 650 mg tylenol every 6 hours for more pain control.   Warm water soaks for 15 min and gentle scrubbing of foot to help remove parts of dried skin and scabs at least once daily.   You need wound check in 3 days. If you are unable to establish care with a primary care doctor or an appointment and [facility] return to the emergency department in 3 days for re-evaluation.

## 2017-11-26 ENCOUNTER — Inpatient Hospital Stay (HOSPITAL_COMMUNITY)
Admission: EM | Admit: 2017-11-26 | Discharge: 2017-12-10 | DRG: 269 | Disposition: A | Discharge status: Home Health | Payer: Medicare Other | Attending: Surgery | Admitting: Surgery

## 2017-11-26 ENCOUNTER — Encounter (HOSPITAL_COMMUNITY): Payer: Self-pay | Admitting: Emergency Medicine

## 2017-11-26 ENCOUNTER — Emergency Department (HOSPITAL_COMMUNITY): Payer: Medicare Other

## 2017-11-26 ENCOUNTER — Ambulatory Visit (INDEPENDENT_AMBULATORY_CARE_PROVIDER_SITE_OTHER)
Admission: EM | Admit: 2017-11-26 | Discharge: 2017-11-26 | Disposition: A | Discharge status: Home / Self Care | Payer: Medicare Other | Source: Home / Self Care

## 2017-11-26 ENCOUNTER — Other Ambulatory Visit: Payer: Self-pay

## 2017-11-26 DIAGNOSIS — I96 Gangrene, not elsewhere classified: Secondary | ICD-10-CM | POA: Diagnosis not present

## 2017-11-26 DIAGNOSIS — B351 Tinea unguium: Secondary | ICD-10-CM | POA: Diagnosis present

## 2017-11-26 DIAGNOSIS — L03032 Cellulitis of left toe: Secondary | ICD-10-CM

## 2017-11-26 DIAGNOSIS — J9811 Atelectasis: Secondary | ICD-10-CM | POA: Diagnosis not present

## 2017-11-26 DIAGNOSIS — F17221 Nicotine dependence, chewing tobacco, in remission: Secondary | ICD-10-CM | POA: Diagnosis not present

## 2017-11-26 DIAGNOSIS — I739 Peripheral vascular disease, unspecified: Secondary | ICD-10-CM | POA: Diagnosis present

## 2017-11-26 DIAGNOSIS — Z4682 Encounter for fitting and adjustment of non-vascular catheter: Secondary | ICD-10-CM | POA: Diagnosis not present

## 2017-11-26 DIAGNOSIS — D62 Acute posthemorrhagic anemia: Secondary | ICD-10-CM | POA: Diagnosis present

## 2017-11-26 DIAGNOSIS — R9430 Abnormal result of cardiovascular function study, unspecified: Secondary | ICD-10-CM | POA: Diagnosis not present

## 2017-11-26 DIAGNOSIS — I6523 Occlusion and stenosis of bilateral carotid arteries: Secondary | ICD-10-CM | POA: Diagnosis present

## 2017-11-26 DIAGNOSIS — L03116 Cellulitis of left lower limb: Secondary | ICD-10-CM | POA: Diagnosis not present

## 2017-11-26 DIAGNOSIS — Z01810 Encounter for preprocedural cardiovascular examination: Secondary | ICD-10-CM | POA: Diagnosis not present

## 2017-11-26 DIAGNOSIS — Z96643 Presence of artificial hip joint, bilateral: Secondary | ICD-10-CM | POA: Diagnosis present

## 2017-11-26 DIAGNOSIS — F17210 Nicotine dependence, cigarettes, uncomplicated: Secondary | ICD-10-CM | POA: Diagnosis not present

## 2017-11-26 DIAGNOSIS — J449 Chronic obstructive pulmonary disease, unspecified: Secondary | ICD-10-CM | POA: Diagnosis not present

## 2017-11-26 DIAGNOSIS — I998 Other disorder of circulatory system: Secondary | ICD-10-CM | POA: Diagnosis present

## 2017-11-26 DIAGNOSIS — R9439 Abnormal result of other cardiovascular function study: Secondary | ICD-10-CM | POA: Diagnosis present

## 2017-11-26 DIAGNOSIS — I350 Nonrheumatic aortic (valve) stenosis: Secondary | ICD-10-CM | POA: Diagnosis not present

## 2017-11-26 DIAGNOSIS — I700 Atherosclerosis of aorta: Secondary | ICD-10-CM | POA: Diagnosis not present

## 2017-11-26 DIAGNOSIS — I10 Essential (primary) hypertension: Secondary | ICD-10-CM | POA: Diagnosis present

## 2017-11-26 DIAGNOSIS — I7410 Embolism and thrombosis of unspecified parts of aorta: Secondary | ICD-10-CM

## 2017-11-26 DIAGNOSIS — I472 Ventricular tachycardia: Secondary | ICD-10-CM | POA: Diagnosis present

## 2017-11-26 DIAGNOSIS — Z01818 Encounter for other preprocedural examination: Secondary | ICD-10-CM | POA: Diagnosis not present

## 2017-11-26 DIAGNOSIS — R609 Edema, unspecified: Secondary | ICD-10-CM | POA: Diagnosis not present

## 2017-11-26 DIAGNOSIS — E8770 Fluid overload, unspecified: Secondary | ICD-10-CM | POA: Diagnosis not present

## 2017-11-26 DIAGNOSIS — Z9889 Other specified postprocedural states: Secondary | ICD-10-CM

## 2017-11-26 DIAGNOSIS — M79605 Pain in left leg: Secondary | ICD-10-CM | POA: Diagnosis not present

## 2017-11-26 DIAGNOSIS — M79609 Pain in unspecified limb: Secondary | ICD-10-CM | POA: Diagnosis not present

## 2017-11-26 DIAGNOSIS — I160 Hypertensive urgency: Secondary | ICD-10-CM | POA: Diagnosis not present

## 2017-11-26 DIAGNOSIS — M7989 Other specified soft tissue disorders: Secondary | ICD-10-CM | POA: Diagnosis not present

## 2017-11-26 DIAGNOSIS — I749 Embolism and thrombosis of unspecified artery: Secondary | ICD-10-CM | POA: Diagnosis not present

## 2017-11-26 LAB — URINALYSIS, ROUTINE W REFLEX MICROSCOPIC
Bilirubin Urine: NEGATIVE
Glucose, UA: NEGATIVE mg/dL
Hgb urine dipstick: NEGATIVE
KETONES UR: NEGATIVE mg/dL
LEUKOCYTES UA: NEGATIVE
NITRITE: NEGATIVE
Protein, ur: NEGATIVE mg/dL
SPECIFIC GRAVITY, URINE: 1.024 (ref 1.005–1.030)
pH: 6 (ref 5.0–8.0)

## 2017-11-26 LAB — COMPREHENSIVE METABOLIC PANEL
ALT: 36 U/L (ref 17–63)
ANION GAP: 10 (ref 5–15)
AST: 37 U/L (ref 15–41)
Albumin: 3.2 g/dL — ABNORMAL LOW (ref 3.5–5.0)
Alkaline Phosphatase: 70 U/L (ref 38–126)
BILIRUBIN TOTAL: 0.7 mg/dL (ref 0.3–1.2)
BUN: 13 mg/dL (ref 6–20)
CALCIUM: 8.7 mg/dL — AB (ref 8.9–10.3)
CO2: 23 mmol/L (ref 22–32)
Chloride: 106 mmol/L (ref 101–111)
Creatinine, Ser: 1.14 mg/dL (ref 0.61–1.24)
GFR calc non Af Amer: >60 mL/min (ref >60)
Glucose, Bld: 102 mg/dL — ABNORMAL HIGH (ref 65–99)
Potassium: 4 mmol/L (ref 3.5–5.1)
Sodium: 139 mmol/L (ref 135–145)
TOTAL PROTEIN: 6.8 g/dL (ref 6.5–8.1)

## 2017-11-26 LAB — CBC WITH DIFFERENTIAL/PLATELET
BASOS ABS: 0.1 10*3/uL (ref 0.0–0.1)
BASOS PCT: 1 %
Eosinophils Absolute: 0.1 10*3/uL (ref 0.0–0.7)
Eosinophils Relative: 1 %
HEMATOCRIT: 39.1 % (ref 39.0–52.0)
HEMOGLOBIN: 13.1 g/dL (ref 13.0–17.0)
Lymphocytes Relative: 24 %
Lymphs Abs: 2.5 10*3/uL (ref 0.7–4.0)
MCH: 30.1 pg (ref 26.0–34.0)
MCHC: 33.5 g/dL (ref 30.0–36.0)
MCV: 89.9 fL (ref 78.0–100.0)
Monocytes Absolute: 0.7 10*3/uL (ref 0.1–1.0)
Monocytes Relative: 7 %
NEUTROS ABS: 7.1 10*3/uL (ref 1.7–7.7)
NEUTROS PCT: 67 %
Platelets: 295 10*3/uL (ref 150–400)
RBC: 4.35 MIL/uL (ref 4.22–5.81)
RDW: 14.3 % (ref 11.5–15.5)
WBC: 10.4 10*3/uL (ref 4.0–10.5)

## 2017-11-26 LAB — I-STAT CG4 LACTIC ACID, ED: Lactic Acid, Venous: 1.71 mmol/L (ref 0.5–1.9)

## 2017-11-26 MED ORDER — ACETAMINOPHEN 325 MG PO TABS: 650.0000 mg | ORAL_TABLET | Freq: Four times a day (QID) | ORAL | Status: DC | PRN

## 2017-11-26 MED ORDER — SODIUM CHLORIDE 0.9 % IV SOLN
INTRAVENOUS | Status: AC
  Administered 2017-11-26: 23:00:00 via INTRAVENOUS

## 2017-11-26 MED ORDER — PIPERACILLIN-TAZOBACTAM 3.375 G IVPB 30 MIN
3.3750 g | Freq: Once | INTRAVENOUS | Status: DC
  Filled 2017-11-26: qty 50

## 2017-11-26 MED ORDER — SENNOSIDES-DOCUSATE SODIUM 8.6-50 MG PO TABS: 1.0000 | ORAL_TABLET | Freq: Every evening | ORAL | Status: DC | PRN

## 2017-11-26 MED ORDER — CLOTRIMAZOLE 1 % EX CREA: TOPICAL_CREAM | Freq: Two times a day (BID) | CUTANEOUS | Status: DC

## 2017-11-26 MED ORDER — ACETAMINOPHEN 650 MG RE SUPP: 650.0000 mg | Freq: Four times a day (QID) | RECTAL | Status: DC | PRN

## 2017-11-26 MED ORDER — ONDANSETRON HCL 4 MG/2ML IJ SOLN: 4.0000 mg | Freq: Four times a day (QID) | INTRAMUSCULAR | Status: DC | PRN

## 2017-11-26 MED ORDER — ONDANSETRON HCL 4 MG PO TABS: 4.0000 mg | ORAL_TABLET | Freq: Four times a day (QID) | ORAL | Status: DC | PRN

## 2017-11-26 MED ORDER — VANCOMYCIN HCL 10 G IV SOLR
2000.0000 mg | Freq: Once | INTRAVENOUS | Status: DC
  Filled 2017-11-26: qty 2000

## 2017-11-26 MED ORDER — HYDRALAZINE HCL 20 MG/ML IJ SOLN
10.0000 mg | INTRAMUSCULAR | Status: DC | PRN
  Administered 2017-11-29: 10 mg via INTRAVENOUS
  Filled 2017-11-26: qty 1

## 2017-11-26 MED ORDER — VANCOMYCIN HCL IN DEXTROSE 1-5 GM/200ML-% IV SOLN
1000.0000 mg | Freq: Two times a day (BID) | INTRAVENOUS | Status: DC
  Administered 2017-11-27 – 2017-11-29 (×5): 1000 mg via INTRAVENOUS
  Filled 2017-11-26 (×5): qty 200

## 2017-11-26 MED ORDER — HYDROCODONE-ACETAMINOPHEN 5-325 MG PO TABS
1.0000 | ORAL_TABLET | ORAL | Status: DC | PRN
  Administered 2017-11-26: 1 via ORAL
  Administered 2017-11-27 – 2017-11-30 (×11): 2 via ORAL
  Administered 2017-11-30: 1 via ORAL
  Administered 2017-11-30 – 2017-12-02 (×9): 2 via ORAL
  Filled 2017-11-26 (×20): qty 2
  Filled 2017-11-26: qty 1
  Filled 2017-11-26 (×2): qty 2

## 2017-11-26 MED ORDER — VANCOMYCIN HCL 10 G IV SOLR
2000.0000 mg | Freq: Once | INTRAVENOUS | Status: AC
  Administered 2017-11-27: 2000 mg via INTRAVENOUS
  Filled 2017-11-26 (×2): qty 2000

## 2017-11-26 NOTE — Discharge Instructions (Signed)
You need to go directly to the emergency room for more intensive care and evaluation of the left toe infection

## 2017-11-26 NOTE — ED Notes (Signed)
Tegeler, MD at bedside. 

## 2017-11-26 NOTE — ED Notes (Signed)
Opyd, MD at bedside 

## 2017-11-26 NOTE — ED Provider Notes (Addendum)
Patient placed in Quick Look pathway, seen and evaluated   Chief Complaint: Wound infection  HPI:   Patient presents with progressively worsening erythema and swelling of the left foot radiating up to the lower leg.  He states that he underwent a toe nail removal of the left great toenail in [place] in February and since then has been having progressively worsening pain.  He notes drainage to the toe as well.  Denies fevers or chills.  He notes constant pain as well as coolness of the toe to the touch.  He was seen and evaluated 1 week ago and discharged with doxycycline which he states makes him feel "jittery and weird ".  He has been taking it as prescribed but notes worsening spread of swelling and erythema to the left lower extremity.  ROS: Positive for wound, leg swelling, negative for fevers  Physical Exam:   Gen: No distress  Neuro: Awake and Alert  Skin: Warm    Focused Exam: Left great toe with thick eschar/necrotic appearing tissue. 2+ pitting edema of the left lower extremity from mid calf down to the toes as well as erythema of the foot and lower leg.  Foul-smelling drainage coming from the great toe in between the great toe and second toe.  Diffuse tenderness to palpation of the toe with some tenderness to palpation of the dorsum of the foot.  No crepitus noted.  Left toe is cool to the touch as compared to the other toes.  2+ DP/PT pulses bilaterally.   Initiation of care has begun. The patient has been counseled on the process, plan, and necessity for staying for the completion/evaluation, and the remainder of the medical screening examination    Jeanie SewerFawze, Jaymien Landin A, PA-C 11/26/17 1744    Jacalyn LefevreHaviland, Julie, MD 12/03/17 [id number]

## 2017-11-26 NOTE — ED Triage Notes (Signed)
Pt c/o increased swelling, redness, and warmth to left ankle. Wound noted to the left heel and left great toe. Recently treated with oral abx, pt states wound has gotten worse. Nail on left great toe removed.

## 2017-11-26 NOTE — Progress Notes (Addendum)
I discussed this case with the emergency department provider.  At this time there is no indication for acute orthopedic intervention.  He will be admitted to the hospitalist service for pain control and IV antibiotics.  Our likely recommendation will be for a face-to-face evaluation by Dr. Aldean BakerMarcus Duda.  Someone from the orthopedic service will evaluate the patient tomorrow.

## 2017-11-26 NOTE — ED Triage Notes (Signed)
Pt c/o ongoing poor wound healing on his big toe of L foot. Nail has been removed. Went to the ER, given antibiotics and pain medications, states "they are making it worse". Problems since the end of Jan.

## 2017-11-26 NOTE — ED Notes (Signed)
ED Provider at bedside. 

## 2017-11-26 NOTE — Progress Notes (Signed)
Pharmacy Antibiotic Note  Doran DurandStephen Hege is a 67 y.o. male admitted on 11/26/2017 with cellulitis.  Pharmacy has been consulted for vancomycin dosing. -CrCl ~ 60-70 -wt 90kg   Plan: -Vancomycin 2000mg  IV x1 followed by 1000mg  IV q12h -Will follow renal function, cultures and clinical progress     Temp (24hrs), Avg:98.2 F (36.8 C), Min:97.7 F (36.5 C), Max:98.7 F (37.1 C)  Recent Labs  Lab 11/26/17 1730 11/26/17 1804  WBC 10.4  --   CREATININE 1.14  --   LATICACIDVEN  --  1.71    CrCl cannot be calculated (Unknown ideal weight.).    No Known Allergies   Thank you for allowing pharmacy to be a part of this patient's care.  Harland GermanAndrew Aceyn Kathol, PharmD Clinical Pharmacist

## 2017-11-26 NOTE — H&P (Signed)
History and Physical    Austin [name] KGU:[id number] DOB: 11/28/1950 DOA: 11/26/2017  PCP: Patient, No Pcp Per   Patient coming from: Home  Chief Complaint: Left first toe blackened; left foot swelling and tenderness   HPI: Austin [name] is a 68 y.o. male who denies any past medical history though notes he does not see a physician, now presenting to the emergency department for evaluation of blackened left great toe with progressive swelling, erythema, and tenderness involving the left foot.  Patient reports that he noted a wound at the left great toe in January, more recently developed swelling, redness, and tenderness involving the left forefoot, was seen in the emergency department for this, and discharged home with doxycycline.  Despite the oral antibiotic, the patient reports continued progressive swelling, erythema, and tenderness of the left forefoot.  He reports some serous drainage from the great toe.  He denies chest pain or palpitations.  Denies fevers or chills.  No known history of diabetes.  ED Course: Upon arrival to the ED, patient is found to be afebrile, saturating well on room air, and hypertensive to 200/90.  Radiographs of the left foot demonstrate diffuse soft tissue swelling involving the forefoot and great toe with no evidence for osteomyelitis, and no change since the prior study.  Chemistry panel and CBC are unremarkable, and lactic acid is reassuringly normal.  Blood cultures were collected, patient was started on IV antibiotics, and orthopedic surgery was consulted by the ED physician.  He remains hypertensive, and no apparent respiratory distress, and will be admitted to the medical-surgical unit for ongoing evaluation and management of left great toe gangrene with surrounding cellulitis.  Review of Systems:  All other systems reviewed and apart from HPI, are negative.  Past Medical History:  Diagnosis Date  . Back pain   . Hypertension     Past Surgical History:    Procedure Laterality Date  . BACK SURGERY    . TOTAL HIP ARTHROPLASTY     bil     reports that he has been smoking.  he has never used smokeless tobacco. He reports that he uses drugs. Drug: Marijuana. He reports that he does not drink alcohol.  No Known Allergies  History reviewed. No pertinent family history.   Prior to Admission medications   Medication Sig Start Date End Date Taking? Authorizing Provider  doxycycline (MONODOX) 100 MG capsule Take 100 mg by mouth 2 (two) times daily. FOR 10 DAYS 11/20/17 11/29/17 Yes [provider]  ibuprofen (ADVIL,MOTRIN) 200 MG tablet Take 600 mg by mouth every 6 (six) hours as needed (for pain).   Yes [provider]  clotrimazole-betamethasone (LOTRISONE) cream Apply to affected area 2 times daily Patient not taking: Reported on 11/26/2017 09/29/17   Ward, Ozella Almond, PA-C  gabapentin (NEURONTIN) 100 MG capsule Take 1 capsule (100 mg total) by mouth 3 (three) times daily. Patient not taking: Reported on 11/26/2017 10/08/17   Street, [place], [place]    Physical Exam: Vitals:   11/26/17 1857 11/26/17 2026 11/26/17 2115 11/26/17 2145  BP: (!) 182/77 (!) 148/74 (!) 172/95 (!) 152/95  Pulse: 77 72 72 74  Resp: '17 18 16 18  ' Temp:      TempSrc:      SpO2: 100% 98% 96% 97%      Constitutional: NAD, calm  Eyes: PERTLA, lids and conjunctivae normal ENMT: Mucous membranes are moist. Posterior pharynx clear of any exudate or lesions.   Neck: normal, supple, no masses, no thyromegaly  Respiratory: clear to auscultation bilaterally, no wheezing, no crackles. Normal respiratory effort.   Cardiovascular: S1 & S2 heard, regular rate and rhythm. No significant JVD. Abdomen: No distension, no tenderness, no masses palpated. Bowel sounds normal.  Musculoskeletal: no clubbing / cyanosis. No red, hot, or swollen  joint.   Skin: Left great toe with darkening and atrophy; left forefoot edema, erythema, and tenderness. Scant serous drainage  from left great toe with maceration of medial 2nd toe. Otherwise warm, dry, well-perfused. Neurologic: CN 2-12 grossly intact. Strength 5/5 in all 4 limbs.  Psychiatric:  Alert and oriented x 3. Cooperative.     Labs on Admission: I have personally reviewed following labs and imaging studies  CBC: Recent Labs  Lab 11/26/17 1730  WBC 10.4  NEUTROABS 7.1  HGB 13.1  HCT 39.1  MCV 89.9  PLT 440   Basic Metabolic Panel: Recent Labs  Lab 11/26/17 1730  NA 139  K 4.0  CL 106  CO2 23  GLUCOSE 102*  BUN 13  CREATININE 1.14  CALCIUM 8.7*   GFR: CrCl cannot be calculated (Unknown ideal weight.). Liver Function Tests: Recent Labs  Lab 11/26/17 1730  AST 37  ALT 36  ALKPHOS 70  BILITOT 0.7  PROT 6.8  ALBUMIN 3.2*   No results for input(s): LIPASE, AMYLASE in the last 168 hours. No results for input(s): AMMONIA in the last 168 hours. Coagulation Profile: No results for input(s): INR, PROTIME in the last 168 hours. Cardiac Enzymes: No results for input(s): CKTOTAL, CKMB, CKMBINDEX, TROPONINI in the last 168 hours. BNP (last 3 results) No results for input(s): PROBNP in the last 8760 hours. HbA1C: No results for input(s): HGBA1C in the last 72 hours. CBG: No results for input(s): GLUCAP in the last 168 hours. Lipid Profile: No results for input(s): CHOL, HDL, LDLCALC, TRIG, CHOLHDL, LDLDIRECT in the last 72 hours. Thyroid Function Tests: No results for input(s): TSH, T4TOTAL, FREET4, T3FREE, THYROIDAB in the last 72 hours. Anemia Panel: No results for input(s): VITAMINB12, FOLATE, FERRITIN, TIBC, IRON, RETICCTPCT in the last 72 hours. Urine analysis:    Component Value Date/Time   COLORURINE YELLOW 11/26/2017 2136   APPEARANCEUR CLEAR 11/26/2017 2136   LABSPEC 1.024 11/26/2017 2136   PHURINE 6.0 11/26/2017 2136   GLUCOSEU NEGATIVE 11/26/2017 2136   HGBUR NEGATIVE 11/26/2017 2136   BILIRUBINUR NEGATIVE 11/26/2017 2136   KETONESUR NEGATIVE 11/26/2017 2136    PROTEINUR NEGATIVE 11/26/2017 2136   NITRITE NEGATIVE 11/26/2017 2136   LEUKOCYTESUR NEGATIVE 11/26/2017 2136   Sepsis Labs: '@LABRCNTIP' (procalcitonin:4,lacticidven:4) )No results found for this or any previous visit (from the past 240 hour(s)).   Radiological Exams on Admission: Dg Foot Complete Left  Result Date: 11/26/2017 CLINICAL DATA:  67 year old male with swelling of the great toe and MTP joint. EXAM: LEFT FOOT - COMPLETE 3+ VIEW COMPARISON:  Radiograph dated 11/19/2017 FINDINGS: There is no acute fracture or dislocation. The bones are mildly osteopenic. There is mild hallux valgus. 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 There is diffuse soft tissue swelling of the foot primarily over the forefoot as well as soft tissue swelling of the great toe. No radiopaque foreign object or soft tissue gas. No bone erosion or periosteal elevation to suggest acute osteomyelitis. IMPRESSION: 1. No acute fracture or dislocation. No evidence of acute osteomyelitis by radiograph. 2. Hallux valgus and degenerative changes of the first tarsometatarsal joint. 3. Diffuse soft tissue swelling primarily involving  the forefoot and great toe. Overall no significant interval change since the prior radiograph. Electronically Signed   By: Anner Crete M.D.   On: 11/26/2017 19:28    EKG: Not performed.   Assessment/Plan   1. Left great toe gangrene; left foot cellulitis  - Presents with increased swelling and pain involving left foot in setting of non-healing chronic left great toe wound  - Reports developing left great toe wound 2 months ago, has since developed swelling and tenderness involving the left forefoot, seen in ED and started on doxycycline, but has experienced continued progression despite this  - Toe has findings consistent with dry gangrene with surrounding forefoot cellulitis  - There is no fever,  leukocytosis, or elevation in lactate on admission  - Blood cultures collected in ED, radiographs neg for underlying osteo  - Orthopedic surgery consulting and much appreciated  - Check ABI, ESR, CRP, continue pain-control and IV abx   2. Hypertensive urgency  - BP elevated to 200/90 in ED  - Pain and anxiety likely contributing; suspect underlying HTN, but pt does have PCP and denies any medical hx  - Treat pain, use hydralazine IVP's prn, consider scheduling oral agent if persists despite pain-control    DVT prophylaxis: SCD's  Code Status: Full  Family Communication: Discussed with patient Consults called: Orthopedic surgery Admission status: Inpatient    Vianne Bulls, MD [facility] Pager [phone]  If 7PM-7AM, please contact night-coverage [email or web address] Password [facility]  11/26/2017, 10:33 PM

## 2017-11-26 NOTE — ED Notes (Signed)
Attempted to call report

## 2017-11-26 NOTE — ED Provider Notes (Signed)
[facility]   [id number] 11/26/17 Arrival Time: 1324   SUBJECTIVE:  Austin [name] is a 67 y.o. male who presents to the urgent care with complaint of ongoing poor wound healing on his big toe of L foot. Nail has been removed. Went to the ER, given antibiotics and pain medications, states "they are making it worse". Problems since the end of Jan.   Patient stubbed his great toe on the left in the middle of January. He had to go to [place] where the pain became so great that he saw a podiatrist. The podiatrist removed the nail (patient has onychomycosis) but the pain has continued. He went to the emergency room last week because his left leg was swollen and he was unable to sleep because of pain in the toe. After evaluation there, he was given an antibiotic and told to follow-up with his primary care doctor.  Over the last week, the left leg is swollen even more with increasing pain in that left great toe. The top of the left great toe is now black and dry.  Past Medical History:  Diagnosis Date  . Back pain   . Hypertension    No family history on file. Social History   Socioeconomic History  . Marital status: Single    Spouse name: Not on file  . Number of children: Not on file  . Years of education: Not on file  . Highest education level: Not on file  Social Needs  . Financial resource strain: Not on file  . Food insecurity - worry: Not on file  . Food insecurity - inability: Not on file  . Transportation needs - medical: Not on file  . Transportation needs - non-medical: Not on file  Occupational History  . Not on file  Tobacco Use  . Smoking status: Current Every Day Smoker  . Smokeless tobacco: Never Used  Substance and Sexual Activity  . Alcohol use: No  . Drug use: Yes    Types: Marijuana  . Sexual activity: Not on file  Other Topics Concern  . Not on file  Social History Narrative  . Not on file   No outpatient medications have been marked as taking  for the 11/26/17 encounter [facility] Encounter).   No Known Allergies    ROS: As per HPI, remainder of ROS negative.   OBJECTIVE:   Vitals:   11/26/17 1410  BP: (!) 183/109  Pulse: 73  Resp: 18  Temp: 97.7 F (36.5 C)  SpO2: 97%     General appearance: alert; no distress Eyes: PERRL; EOMI; conjunctiva normal HENT: normocephalic; atraumatic;  Neck: supple Skin: warm and dry; left great toe is black on the dorsal side down to the MTP joint is very tender with palpation of the plantar surface. There is what appears to be a blister on the plantar distal phalanx. Neurologic: normal gait; grossly normal Psychological: alert and cooperative; normal mood and affect      Labs:  Results for orders placed or performed during the hospital encounter of 11/19/17  CBC with Differential  Result Value Ref Range   WBC 9.4 4.0 - 10.5 K/uL   RBC 4.48 4.22 - 5.81 MIL/uL   Hemoglobin 13.7 13.0 - 17.0 g/dL   HCT 56.2 13.0 - 86.5 %   MCV 90.4 78.0 - 100.0 fL   MCH 30.6 26.0 - 34.0 pg   MCHC 33.8 30.0 - 36.0 g/dL   RDW 78.4 69.6 - 29.5 %   Platelets  238 150 - 400 K/uL   Neutrophils Relative % 66 %   Neutro Abs 6.2 1.7 - 7.7 K/uL   Lymphocytes Relative 26 %   Lymphs Abs 2.4 0.7 - 4.0 K/uL   Monocytes Relative 7 %   Monocytes Absolute 0.6 0.1 - 1.0 K/uL   Eosinophils Relative 2 %   Eosinophils Absolute 0.1 0.0 - 0.7 K/uL   Basophils Relative 1 %   Basophils Absolute 0.1 0.0 - 0.1 K/uL  Basic metabolic panel  Result Value Ref Range   Sodium 139 135 - 145 mmol/L   Potassium 4.1 3.5 - 5.1 mmol/L   Chloride 108 101 - 111 mmol/L   CO2 23 22 - 32 mmol/L   Glucose, Bld 99 65 - 99 mg/dL   BUN 15 6 - 20 mg/dL   Creatinine, Ser 1.610.99 0.61 - 1.24 mg/dL   Calcium 8.8 (L) 8.9 - 10.3 mg/dL   GFR calc non Af Amer >60 >60 mL/min   GFR calc Af Amer >60 >60 mL/min   Anion gap 8 5 - 15  Brain natriuretic peptide  Result Value Ref Range   B Natriuretic Peptide 73.8 0.0 - 100.0 pg/mL     EXAM: LEFT FOOT - COMPLETE 3+ VIEW  COMPARISON:  Plain films left foot 09/19/2017.  FINDINGS: No bony destructive change or periosteal reaction is identified. No radiopaque foreign body is seen. Hallux valgus deformity and osteoarthritic change about the first tarsometatarsal joint is noted. Soft tissues of the foot are swollen. No soft tissue gas.  IMPRESSION: Soft tissue swelling. Negative for evidence of osteomyelitis. No foreign body.  Hallux valgus.  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   Electronically Signed   By: Drusilla Kannerhomas  Dalessio M.D.   On: 11/19/2017 10:23   ASSESSMENT & PLAN:  1. Cellulitis of toe of left foot     No orders of the defined types were placed in this encounter.   Reviewed expectations re: course of current medical issues. Questions answered. Outlined signs and symptoms indicating need for more acute intervention. Patient verbalized understanding. After Visit Summary given.    Procedures:      Elvina SidleLauenstein, Alexanderjames Berg, MD 11/26/17 1457

## 2017-11-26 NOTE — ED Provider Notes (Signed)
[facility]  SURGICAL Provider Note   CSN: [id number] Arrival date & time: 11/26/17  1523     History   Chief Complaint Chief Complaint  Patient presents with  . Wound Check    HPI Austin [name] is a 67 y.o. male.  The history is provided by the patient.  Patient is a 67 year old male with history of hypertension and back pain who is sent here from urgent care for further evaluation of his left great toe.  Starting in January he states he stubbed his toe and had the nail removed and since then it has gotten infected and turned black.  He is currently on day 5 of oral doxycycline and he states it seems to be getting worse.  He states the pain and swelling is spreading up his foot and ankle.  He denies any fevers or chills or any other symptoms.  He denies taking other medications.  Past Medical History:  Diagnosis Date  . Back pain   . Hypertension     Patient Active Problem List   Diagnosis Date Noted  . Gangrene of toe of left foot (HCC) 11/26/2017  . Hypertension 11/26/2017  . Cellulitis of foot, left 11/26/2017  . Hypertensive urgency     Past Surgical History:  Procedure Laterality Date  . BACK SURGERY    . TOTAL HIP ARTHROPLASTY     bil       Home Medications    Prior to Admission medications   Medication Sig Start Date End Date Taking? Authorizing Provider  doxycycline (MONODOX) 100 MG capsule Take 100 mg by mouth 2 (two) times daily. FOR 10 DAYS 11/20/17 11/29/17 Yes [provider]  ibuprofen (ADVIL,MOTRIN) 200 MG tablet Take 600 mg by mouth every 6 (six) hours as needed (for pain).   Yes [provider]  clotrimazole-betamethasone (LOTRISONE) cream Apply to affected area 2 times daily Patient not taking: Reported on 11/26/2017 09/29/17   Ward, Chase Picket, PA-C  gabapentin (NEURONTIN) 100 MG capsule Take 1 capsule (100 mg total) by mouth 3 (three) times daily. Patient not taking: Reported on 11/26/2017 10/08/17    Street, Seymour, PA-C    Family History History reviewed. No pertinent family history.  Social History Social History   Tobacco Use  . Smoking status: Current Every Day Smoker  . Smokeless tobacco: Never Used  Substance Use Topics  . Alcohol use: No  . Drug use: Yes    Types: Marijuana     Allergies   Patient has no known allergies.   Review of Systems Review of Systems  Constitutional: Negative for chills and fever.  HENT: Negative for ear pain and sore throat.   Eyes: Negative for pain and visual disturbance.  Respiratory: Negative for cough and shortness of breath.   Cardiovascular: Negative for chest pain.  Gastrointestinal: Negative for abdominal pain and vomiting.  Genitourinary: Negative for dysuria.  Musculoskeletal: Negative for arthralgias and back pain.  Skin: Positive for color change and wound.  Neurological: Negative for numbness and headaches.  All other systems reviewed and are negative.    Physical Exam Updated Vital Signs BP (!) 185/93 (BP Location: Left Arm)   Pulse 87   Temp 98.6 F (37 C) (Oral)   Resp 18   SpO2 100%   Physical Exam  Constitutional: He appears well-developed and well-nourished. No distress.  HENT:  Head: Normocephalic and atraumatic.  Eyes: Conjunctivae are normal.  Neck: Neck supple.  Cardiovascular: Normal rate and regular  rhythm.  No murmur heard. Pulmonary/Chest: Effort normal and breath sounds normal. No respiratory distress. He has no wheezes. He has no rales.  Abdominal: Soft. There is no tenderness.  Musculoskeletal: He exhibits no edema.  Neurological: He is alert.  Skin: Skin is warm and dry.  Psychiatric: He has a normal mood and affect.  Nursing note and vitals reviewed.    ED Treatments / Results  Labs (all labs ordered are listed, but only abnormal results are displayed) Labs Reviewed  COMPREHENSIVE METABOLIC PANEL - Abnormal; Notable for the following components:      Result Value   Glucose,  Bld 102 (*)    Calcium 8.7 (*)    Albumin 3.2 (*)    All other components within normal limits  C-REACTIVE PROTEIN - Abnormal; Notable for the following components:   CRP 4.3 (*)    All other components within normal limits  SEDIMENTATION RATE - Abnormal; Notable for the following components:   Sed Rate 50 (*)    All other components within normal limits  CULTURE, BLOOD (ROUTINE X 2)  CULTURE, BLOOD (ROUTINE X 2)  CBC WITH DIFFERENTIAL/PLATELET  URINALYSIS, ROUTINE W REFLEX MICROSCOPIC  HIV ANTIBODY (ROUTINE TESTING)  BASIC METABOLIC PANEL  CBC WITH DIFFERENTIAL/PLATELET  I-STAT CG4 LACTIC ACID, ED    EKG  EKG Interpretation None       Radiology Dg Foot Complete Left  Result Date: 11/26/2017 CLINICAL DATA:  67 year old male with swelling of the great toe and MTP joint. EXAM: LEFT FOOT - COMPLETE 3+ VIEW COMPARISON:  Radiograph dated 11/19/2017 FINDINGS: There is no acute fracture or dislocation. The bones are mildly osteopenic. There is mild hallux valgus. 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 There is diffuse soft tissue swelling of the foot primarily over the forefoot as well as soft tissue swelling of the great toe. No radiopaque foreign object or soft tissue gas. No bone erosion or periosteal elevation to suggest acute osteomyelitis. IMPRESSION: 1. No acute fracture or dislocation. No evidence of acute osteomyelitis by radiograph. 2. Hallux valgus and degenerative changes of the first tarsometatarsal joint. 3. Diffuse soft tissue swelling primarily involving the forefoot and great toe. Overall no significant interval change since the prior radiograph. Electronically Signed   By: Elgie CollardArash  Radparvar M.D.   On: 11/26/2017 19:28    Procedures Procedures (including critical care time)  Medications Ordered in ED Medications  0.9 %  sodium chloride infusion ( Intravenous New Bag/Given 11/26/17  2325)  acetaminophen (TYLENOL) tablet 650 mg (not administered)    Or  acetaminophen (TYLENOL) suppository 650 mg (not administered)  HYDROcodone-acetaminophen (NORCO/VICODIN) 5-325 MG per tablet 1-2 tablet (1 tablet Oral Given 11/26/17 2324)  senna-docusate (Senokot-S) tablet 1 tablet (not administered)  ondansetron (ZOFRAN) tablet 4 mg (not administered)    Or  ondansetron (ZOFRAN) injection 4 mg (not administered)  hydrALAZINE (APRESOLINE) injection 10 mg (not administered)  vancomycin (VANCOCIN) 2,000 mg in sodium chloride 0.9 % 500 mL IVPB (2,000 mg Intravenous New Bag/Given 11/27/17 0016)  vancomycin (VANCOCIN) IVPB 1000 mg/200 mL premix (not administered)  hydrALAZINE (APRESOLINE) injection 10 mg (not administered)     Initial Impression / Assessment and Plan / ED Course  I have reviewed the triage vital signs and the nursing notes.  Pertinent labs & imaging results that were available during my care of the patient were reviewed by me and considered in my medical decision making (see chart for  details).     Patient is a 67 year old male with history of hypertension and back pain who presents for evaluation of left foot infection.  He has both dry and wet gangrene of his first and second left toe.  He also has cellulitis extending up his foot.  He has failed outpatient antibiotics with doxycycline.  Patient otherwise not septic.  IV antibiotics started with vancomycin and Zosyn.  Blood cultures obtained.  Labs otherwise unremarkable.  I spoke with orthopedics on call who recommended that someone from their team see him to discuss definitive management which would likely be amputation.  Given there is some wet gangrene and cellulitis, patient may not be discharged home at this time.  There is no crepitus on exam, no pain out of proportion given the patient is not septic, very low suspicion for necrotizing fasciitis.    Patient will be admitted and orthopedics will see him  tomorrow.  Final Clinical Impressions(s) / ED Diagnoses   Final diagnoses:  None  1. Dry gangrene L 1st toe 2. Wet gangrene L 2nd toe 3. Cellulitis L foot  ED Discharge Orders    None       Dwana Melena, DO 11/27/17 0200    Tegeler, Canary Brim, MD 11/27/17 [phone]

## 2017-11-27 ENCOUNTER — Other Ambulatory Visit: Payer: Self-pay

## 2017-11-27 ENCOUNTER — Encounter (HOSPITAL_COMMUNITY): Payer: Medicare Other

## 2017-11-27 LAB — BASIC METABOLIC PANEL
ANION GAP: 9 (ref 5–15)
BUN: 14 mg/dL (ref 6–20)
CO2: 21 mmol/L — AB (ref 22–32)
CREATININE: 0.98 mg/dL (ref 0.61–1.24)
Calcium: 8.3 mg/dL — ABNORMAL LOW (ref 8.9–10.3)
Chloride: 108 mmol/L (ref 101–111)
GFR calc non Af Amer: >60 mL/min (ref >60)
Glucose, Bld: 92 mg/dL (ref 65–99)
POTASSIUM: 3.8 mmol/L (ref 3.5–5.1)
SODIUM: 138 mmol/L (ref 135–145)

## 2017-11-27 LAB — CBC WITH DIFFERENTIAL/PLATELET
Basophils Absolute: 0.1 10*3/uL (ref 0.0–0.1)
Basophils Relative: 1 %
EOS ABS: 0.2 10*3/uL (ref 0.0–0.7)
Eosinophils Relative: 2 %
HEMATOCRIT: 38.6 % — AB (ref 39.0–52.0)
HEMOGLOBIN: 12.6 g/dL — AB (ref 13.0–17.0)
LYMPHS ABS: 2.2 10*3/uL (ref 0.7–4.0)
LYMPHS PCT: 26 %
MCH: 29.2 pg (ref 26.0–34.0)
MCHC: 32.6 g/dL (ref 30.0–36.0)
MCV: 89.4 fL (ref 78.0–100.0)
Monocytes Absolute: 0.6 10*3/uL (ref 0.1–1.0)
Monocytes Relative: 7 %
NEUTROS ABS: 5.3 10*3/uL (ref 1.7–7.7)
NEUTROS PCT: 64 %
Platelets: 266 10*3/uL (ref 150–400)
RBC: 4.32 MIL/uL (ref 4.22–5.81)
RDW: 14.3 % (ref 11.5–15.5)
WBC: 8.3 10*3/uL (ref 4.0–10.5)

## 2017-11-27 LAB — C-REACTIVE PROTEIN: CRP: 4.3 mg/dL — AB (ref <1.0)

## 2017-11-27 LAB — HIV ANTIBODY (ROUTINE TESTING W REFLEX): HIV Screen 4th Generation wRfx: NONREACTIVE

## 2017-11-27 LAB — SEDIMENTATION RATE: Sed Rate: 50 mm/hr — ABNORMAL HIGH (ref 0–16)

## 2017-11-27 MED ORDER — CEFTRIAXONE SODIUM 1 G IJ SOLR: 2.0000 g | INTRAMUSCULAR | Status: DC

## 2017-11-27 MED ORDER — SODIUM CHLORIDE 0.9 % IV SOLN
2.0000 g | INTRAVENOUS | Status: DC
  Administered 2017-11-27 – 2017-12-02 (×5): 2 g via INTRAVENOUS
  Filled 2017-11-27 (×7): qty 20

## 2017-11-27 MED ORDER — LISINOPRIL 5 MG PO TABS
5.0000 mg | ORAL_TABLET | Freq: Every day | ORAL | Status: DC
  Administered 2017-11-27 – 2017-11-28 (×2): 5 mg via ORAL
  Filled 2017-11-27 (×2): qty 1

## 2017-11-27 MED ORDER — HYDRALAZINE HCL 20 MG/ML IJ SOLN
10.0000 mg | Freq: Once | INTRAMUSCULAR | Status: AC
  Administered 2017-11-27: 10 mg via INTRAVENOUS
  Filled 2017-11-27: qty 1

## 2017-11-27 NOTE — Progress Notes (Signed)
PROGRESS NOTE    Jonel Weldon  GEX:[id number] DOB: 05-27-1951 DOA: 11/26/2017 PCP: Patient, No Pcp Per    Brief Narrative:  67 y.o. male who denies any past medical history though notes he does not see a physician, now presenting to the emergency department for evaluation of blackened left great toe with progressive swelling, erythema, and tenderness involving the left foot.  Patient reports that he noted a wound at the left great toe in January, more recently developed swelling, redness, and tenderness involving the left forefoot, was seen in the emergency department for this, and discharged home with doxycycline.  Despite the oral antibiotic, the patient reports continued progressive swelling, erythema, and tenderness of the left forefoot.  He reports some serous drainage from the great toe.  He denies chest pain or palpitations.  Denies fevers or chills.  No known history of diabetes.  ED Course: Upon arrival to the ED, patient is found to be afebrile, saturating well on room air, and hypertensive to 200/90.  Radiographs of the left foot demonstrate diffuse soft tissue swelling involving the forefoot and great toe with no evidence for osteomyelitis, and no change since the prior study.  Chemistry panel and CBC are unremarkable, and lactic acid is reassuringly normal.  Blood cultures were collected, patient was started on IV antibiotics, and orthopedic surgery was consulted by the ED physician.  He remains hypertensive, and no apparent respiratory distress, and will be admitted to the medical-surgical unit for ongoing evaluation and management of left great toe gangrene with surrounding cellulitis.   Assessment & Plan:   Principal Problem:   Gangrene of toe of left foot (HCC) Active Problems:   Hypertension   Cellulitis of foot, left  1. Left great toe gangrene; left foot cellulitis  - Presents with increased swelling and pain involving left foot in setting of non-healing chronic left great  toe wound  - Reports developing left great toe wound 2 months ago, has since developed swelling and tenderness involving the left forefoot, seen in ED and started on doxycycline, but has experienced continued progression despite this  - Toe has findings consistent with dry gangrene with surrounding forefoot cellulitis  - There is no fever, leukocytosis, or elevation in lactate on admission  - Blood cultures collected in ED, radiographs neg for underlying osteo  - Orthopedic surgery consulting and much appreciated  - Check ABI, ESR, CRP, continue pain-control and IV abx   2. Hypertensive urgency  - BP elevated to 200/90 in ED  - Pain and anxiety likely contributing; suspect underlying HTN, but pt does have PCP and denies any medical hx  - Treat pain, use hydralazine IVP's prn.  Will start Zestril 5 mg daily.  DVT prophylaxis:scd  Code Status full Family Communication:none Disposition Plan:tbd Consultants: ortho  Procedures: none Antimicrobials:vanco/roecphin  Subjective:no new complaints.tearful and over whelmed.  Objective: Vitals:   11/26/17 2115 11/26/17 2145 11/26/17 2317 11/27/17 0530  BP: (!) 172/95 (!) 152/95 (!) 185/93 (!) 145/62  Pulse: 72 74 87 63  Resp: _0 Temp:   98.6 F (37 C) 98.5 F (36.9 C)  TempSrc:   Oral Oral  SpO2: 96% 97% 100% 96%    Intake/Output Summary (Last 24 hours) at 11/27/2017 1131 Last data filed at 11/27/2017 1100 Gross per 24 hour  Intake 520 ml  Output -  Net 520 ml   There were no vitals filed for this visit.  Examination:  General exam: Appears calm and comfortable  Respiratory system:  Clear to auscultation. Respiratory effort normal. Cardiovascular system: S1 & S2 heard, RRR. No JVD, murmurs, rubs, gallops or clicks. No pedal edema. Gastrointestinal system: Abdomen is nondistended, soft and nontender. No organomegaly or masses felt. Normal bowel sounds heard. Central nervous system: Alert and oriented. No focal  neurological deficits. Extremities: Symmetric 5 x 5 power. Skin: No rashes, lesions or ulcers Psychiatry: Judgement and insight appear normal. Mood & affect appropriate.     Data Reviewed: I have personally reviewed following labs and imaging studies  CBC: Recent Labs  Lab 11/26/17 1730 11/27/17 0719  WBC 10.4 8.3  NEUTROABS 7.1 5.3  HGB 13.1 12.6*  HCT 39.1 38.6*  MCV 89.9 89.4  PLT 295 266   Basic Metabolic Panel: Recent Labs  Lab 11/26/17 1730 11/27/17 0719  NA 139 138  K 4.0 3.8  CL 106 108  CO2 23 21*  GLUCOSE 102* 92  BUN 13 14  CREATININE 1.14 0.98  CALCIUM 8.7* 8.3*   GFR: CrCl cannot be calculated (Unknown ideal weight.). Liver Function Tests: Recent Labs  Lab 11/26/17 1730  AST 37  ALT 36  ALKPHOS 70  BILITOT 0.7  PROT 6.8  ALBUMIN 3.2*   No results for input(s): LIPASE, AMYLASE in the last 168 hours. No results for input(s): AMMONIA in the last 168 hours. Coagulation Profile: No results for input(s): INR, PROTIME in the last 168 hours. Cardiac Enzymes: No results for input(s): CKTOTAL, CKMB, CKMBINDEX, TROPONINI in the last 168 hours. BNP (last 3 results) No results for input(s): PROBNP in the last 8760 hours. HbA1C: No results for input(s): HGBA1C in the last 72 hours. CBG: No results for input(s): GLUCAP in the last 168 hours. Lipid Profile: No results for input(s): CHOL, HDL, LDLCALC, TRIG, CHOLHDL, LDLDIRECT in the last 72 hours. Thyroid Function Tests: No results for input(s): TSH, T4TOTAL, FREET4, T3FREE, THYROIDAB in the last 72 hours. Anemia Panel: No results for input(s): VITAMINB12, FOLATE, FERRITIN, TIBC, IRON, RETICCTPCT in the last 72 hours. Sepsis Labs: Recent Labs  Lab 11/26/17 1804  LATICACIDVEN 1.71    No results found for this or any previous visit (from the past 240 hour(s)).       Radiology Studies: Dg Foot Complete Left  Result Date: 11/26/2017 CLINICAL DATA:  67-year-old male with swelling of the great  toe and MTP joint. EXAM: LEFT FOOT - COMPLETE 3+ VIEW COMPARISON:  Radiograph dated 11/19/2017 FINDINGS: There is no acute fracture or dislocation. The bones are mildly osteopenic. There is mild hallux valgus. 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 There is diffuse soft tissue swelling of the foot primarily over the forefoot as well as soft tissue swelling of the great toe. No radiopaque foreign object or soft tissue gas. No bone erosion or periosteal elevation to suggest acute osteomyelitis. IMPRESSION: 1. No acute fracture or dislocation. No evidence of acute osteomyelitis by radiograph. 2. Hallux valgus and degenerative changes of the first tarsometatarsal joint. 3. Diffuse soft tissue swelling primarily involving the forefoot and great toe. Overall no significant interval change since the prior radiograph. Electronically Signed   By: Arash  Radparvar M.D.   On: 11/26/2017 19:28        Scheduled Meds: Continuous Infusions: . cefTRIAXone (ROCEPHIN)  IV 2 g (11/27/17 1035)  . vancomycin       LOS: 1 day     Elizabeth G Mathews, MD [facility]  If 7PM-7AM, please contact night-coverage [email or web address]   Password [email or web address] 11/27/2017, 11:31 AM

## 2017-11-27 NOTE — Consult Note (Signed)
Reason for Consult:Toe ulcer Referring Physician: E Fitzpatrick [name] is an 67 y.o. male.  HPI: Austin [name] came to the ED with a ~9 week hx/o toe ulcer. He says this began in late January (though records show it was early January) when he was cutting his toenail and cut a little too far. Soon after that he bumped it and developed a small wound that quickly worsened. He came to ED and was given a course of Keflex. He says this made no difference. He returned a week later with continued severe pain but no ulceration at this time per EDP note. Sometime in February he went to [place] for his brother's funeral and while there saw a provider to removed the nail. The toe started blackening soon after that and has also had some serous drainage. He returned home and came back to the ED 3/6 where he was placed on a course of doxy with no improvement. He returned to ED yesterday and was admitted. He denies any prior hx/o similar. He is not diabetic. He does smoke 1ppd but says he has quit as of yesterday. He also notes bilateral leg swelling, R>L, that has occurred in the last 10d or so. He denies claudication and rides a bike regularly for exercise.  Past Medical History:  Diagnosis Date  . Back pain   . Hypertension     Past Surgical History:  Procedure Laterality Date  . BACK SURGERY    . TOTAL HIP ARTHROPLASTY     bil    History reviewed. No pertinent family history.  Social History:  reports that he has been smoking.  he has never used smokeless tobacco. He reports that he uses drugs. Drug: Marijuana. He reports that he does not drink alcohol.  Allergies: No Known Allergies  Medications: I have reviewed the patient's current medications.  Results for orders placed or performed during the hospital encounter of 11/26/17 (from the past 48 hour(s))  Comprehensive metabolic panel     Status: Abnormal   Collection Time: 11/26/17  5:30 PM  Result Value Ref Range   Sodium 139 135 - 145 mmol/L   Potassium 4.0 3.5 - 5.1 mmol/L   Chloride 106 101 - 111 mmol/L   CO2 23 22 - 32 mmol/L   Glucose, Bld 102 (H) 65 - 99 mg/dL   BUN 13 6 - 20 mg/dL   Creatinine, Ser 1.14 0.61 - 1.24 mg/dL   Calcium 8.7 (L) 8.9 - 10.3 mg/dL   Total Protein 6.8 6.5 - 8.1 g/dL   Albumin 3.2 (L) 3.5 - 5.0 g/dL   AST 37 15 - 41 U/L   ALT 36 17 - 63 U/L   Alkaline Phosphatase 70 38 - 126 U/L   Total Bilirubin 0.7 0.3 - 1.2 mg/dL   GFR calc non Af Amer >60 >60 mL/min   GFR calc Af Amer >60 >60 mL/min    Comment: (NOTE) The eGFR has been calculated using the CKD EPI equation. This calculation has not been validated in all clinical situations. eGFR's persistently <60 mL/min signify possible Chronic Kidney Disease.    Anion gap 10 5 - 15    Comment: Performed at [facility] [address], [place], [place] [address]  CBC with Differential     Status: None   Collection Time: 11/26/17  5:30 PM  Result Value Ref Range   WBC 10.4 4.0 - 10.5 K/uL   RBC 4.35 4.22 - 5.81 MIL/uL   Hemoglobin 13.1 13.0 -  17.0 g/dL   HCT 39.1 39.0 - 52.0 %   MCV 89.9 78.0 - 100.0 fL   MCH 30.1 26.0 - 34.0 pg   MCHC 33.5 30.0 - 36.0 g/dL   RDW 14.3 11.5 - 15.5 %   Platelets 295 150 - 400 K/uL   Neutrophils Relative % 67 %   Neutro Abs 7.1 1.7 - 7.7 K/uL   Lymphocytes Relative 24 %   Lymphs Abs 2.5 0.7 - 4.0 K/uL   Monocytes Relative 7 %   Monocytes Absolute 0.7 0.1 - 1.0 K/uL   Eosinophils Relative 1 %   Eosinophils Absolute 0.1 0.0 - 0.7 K/uL   Basophils Relative 1 %   Basophils Absolute 0.1 0.0 - 0.1 K/uL    Comment: Performed at [facility] [address] [place], [place] [address]  I-Stat CG4 Lactic Acid, ED     Status: None   Collection Time: 11/26/17  6:04 PM  Result Value Ref Range   Lactic Acid, Venous 1.71 0.5 - 1.9 mmol/L  Urinalysis, Routine w reflex microscopic     Status: None   Collection Time: 11/26/17  9:36 PM  Result Value Ref Range   Color, Urine YELLOW YELLOW   APPearance CLEAR  CLEAR   Specific Gravity, Urine 1.024 1.005 - 1.030   pH 6.0 5.0 - 8.0   Glucose, UA NEGATIVE NEGATIVE mg/dL   Hgb urine dipstick NEGATIVE NEGATIVE   Bilirubin Urine NEGATIVE NEGATIVE   Ketones, ur NEGATIVE NEGATIVE mg/dL   Protein, ur NEGATIVE NEGATIVE mg/dL   Nitrite NEGATIVE NEGATIVE   Leukocytes, UA NEGATIVE NEGATIVE    Comment: Performed at [facility], [place] [address] [place], [place] [address]  C-reactive protein     Status: Abnormal   Collection Time: 11/26/17 11:08 PM  Result Value Ref Range   CRP 4.3 (H) <1.0 mg/dL    Comment: Performed at [facility] [address] [place], [place] [address]  Sedimentation rate     Status: Abnormal   Collection Time: 11/26/17 11:08 PM  Result Value Ref Range   Sed Rate 50 (H) 0 - 16 mm/hr    Comment: Performed at [facility] [address] [place], [place] [address]  Basic metabolic panel     Status: Abnormal   Collection Time: 11/27/17  7:19 AM  Result Value Ref Range   Sodium 138 135 - 145 mmol/L   Potassium 3.8 3.5 - 5.1 mmol/L   Chloride 108 101 - 111 mmol/L   CO2 21 (L) 22 - 32 mmol/L   Glucose, Bld 92 65 - 99 mg/dL   BUN 14 6 - 20 mg/dL   Creatinine, Ser 0.98 0.61 - 1.24 mg/dL   Calcium 8.3 (L) 8.9 - 10.3 mg/dL   GFR calc non Af Amer >60 >60 mL/min   GFR calc Af Amer >60 >60 mL/min    Comment: (NOTE) The eGFR has been calculated using the CKD EPI equation. This calculation has not been validated in all clinical situations. eGFR's persistently <60 mL/min signify possible Chronic Kidney Disease.    Anion gap 9 5 - 15    Comment: Performed at [facility] [address], [place],  [address]  CBC WITH DIFFERENTIAL     Status: Abnormal   Collection Time: 11/27/17  7:19 AM  Result Value Ref Range   WBC 8.3 4.0 - 10.5 K/uL   RBC 4.32 4.22 - 5.81 MIL/uL   Hemoglobin 12.6 (L)  13.0 - 17.0 g/dL   HCT 38.6 (L) 39.0 - 52.0 %   MCV 89.4 78.0 - 100.0 fL   MCH 29.2 26.0 - 34.0 pg   MCHC 32.6  30.0 - 36.0 g/dL   RDW 14.3 11.5 - 15.5 %   Platelets 266 150 - 400 K/uL   Neutrophils Relative % 64 %   Neutro Abs 5.3 1.7 - 7.7 K/uL   Lymphocytes Relative 26 %   Lymphs Abs 2.2 0.7 - 4.0 K/uL   Monocytes Relative 7 %   Monocytes Absolute 0.6 0.1 - 1.0 K/uL   Eosinophils Relative 2 %   Eosinophils Absolute 0.2 0.0 - 0.7 K/uL   Basophils Relative 1 %   Basophils Absolute 0.1 0.0 - 0.1 K/uL    Comment: Performed at [facility] [address] [place], [place] [address]    Dg Foot Complete Left  Result Date: 11/26/2017 CLINICAL DATA:  67 year old male with swelling of the great toe and MTP joint. EXAM: LEFT FOOT - COMPLETE 3+ VIEW COMPARISON:  Radiograph dated 11/19/2017 FINDINGS: There is no acute fracture or dislocation. The bones are mildly osteopenic. There is mild hallux valgus. 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 There is diffuse soft tissue swelling of the foot primarily over the forefoot as well as soft tissue swelling of the great toe. No radiopaque foreign object or soft tissue gas. No bone erosion or periosteal elevation to suggest acute osteomyelitis. IMPRESSION: 1. No acute fracture or dislocation. No evidence of acute osteomyelitis by radiograph. 2. Hallux valgus and degenerative changes of the first tarsometatarsal joint. 3. Diffuse soft tissue swelling primarily involving the forefoot and great toe. Overall no significant interval change since the prior radiograph. Electronically Signed   By: Anner Crete M.D.   On: 11/26/2017 19:28    Review of Systems  Constitutional: Negative for weight loss.  HENT: Negative for ear discharge, ear pain, hearing loss and tinnitus.   Eyes: Negative for blurred vision, double vision, photophobia and pain.  Respiratory: Negative for cough, sputum production and shortness of breath.   Cardiovascular: Positive for leg swelling. Negative for  chest pain.  Gastrointestinal: Negative for abdominal pain, nausea and vomiting.  Genitourinary: Negative for dysuria, flank pain, frequency and urgency.  Musculoskeletal: Positive for joint pain (Left great toe). Negative for back pain, falls, myalgias and neck pain.  Neurological: Negative for dizziness, tingling, sensory change, focal weakness, loss of consciousness and headaches.  Endo/Heme/Allergies: Does not bruise/bleed easily.  Psychiatric/Behavioral: Negative for depression, memory loss and substance abuse. The patient is not nervous/anxious.    Blood pressure (!) 145/62, pulse 63, temperature 98.5 F (36.9 C), temperature source Oral, resp. rate 18, SpO2 96 %. Physical Exam  Constitutional: He appears well-developed and well-nourished. No distress.  HENT:  Head: Normocephalic and atraumatic.  Eyes: Conjunctivae are normal. Right eye exhibits no discharge. Left eye exhibits no discharge. No scleral icterus.  Neck: Normal range of motion.  Cardiovascular: Normal rate and regular rhythm.  Respiratory: Effort normal. No respiratory distress.  Musculoskeletal:  RLE No traumatic wounds, ecchymosis, or rash  Nontender  No knee or ankle effusion  Knee stable to varus/ valgus and anterior/posterior stress  Sens DPN, SPN, TN intact  Motor EHL, ext, flex, evers 5/5  DP 0, PT 0, 1+ NP edema  LLE No traumatic wounds, ecchymosis, or rash  Blackened, woody distal great toe with interdigital maceration, TTP, some  fluid filled areas medial plantar area  No knee or ankle effusion  Knee stable to varus/ valgus and anterior/posterior stress  Sens DPN, SPN, TN intact  Motor EHL, ext, flex, evers 5/5  DP 0, PT 0, 2+ NP edema  Neurological: He is alert.  Skin: Skin is warm and dry. He is not diaphoretic.  Psychiatric: He has a normal mood and affect. His behavior is normal.    Assessment/Plan: Right toe necrosis -- I am suspicious this is vascular in nature. Agree with ABI's already  ordered, especially given I cannot find a reliable pulse bilaterally. No evidence of osteo on x-ray, may need MRI if fails to improve with this round of treatment. Agree with vancomycin, would add Rocephin given failure to improve on last two rounds of abx. Hard to explain the LLE edema with the right toe pathology. Will check BNP but I think vascular issue will be where the money is. No surgery today so will give diet. May benefit from debridement but don't think amputation is warranted yet. Dr. Sharol Given to evaluate, likely in AM. Diffuse onychomycosis -- Given current issues would probably be prudent to treat but may wait until acute event is treated. Tobacco use -- Encouraged continued cessation    Lisette Abu, PA-C Orthopedic Surgery [phone] 11/27/2017, 9:08 AM

## 2017-11-27 NOTE — Plan of Care (Signed)
  Clinical Measurements: Ability to maintain clinical measurements within normal limits will improve 11/27/2017 2003 - Progressing by Luther Redourgott, John Vasconcelos, RN   Pain Managment: General experience of comfort will improve 11/27/2017 2003 - Progressing by Luther Redourgott, Brendan Gruwell, RN

## 2017-11-27 NOTE — Progress Notes (Signed)
[facility] alarm regardless of the teaching and explanation.

## 2017-11-27 NOTE — Progress Notes (Signed)
Patient ID: Doran DurandStephen Musgrave, male   DOB: 02/16/1951, 67 y.o.   MRN: [id number] Dry gangrene left great toe.  Awaiting ankle-brachial indices  report.

## 2017-11-28 ENCOUNTER — Inpatient Hospital Stay (HOSPITAL_COMMUNITY): Payer: Medicare Other

## 2017-11-28 DIAGNOSIS — I96 Gangrene, not elsewhere classified: Secondary | ICD-10-CM

## 2017-11-28 DIAGNOSIS — M79605 Pain in left leg: Secondary | ICD-10-CM

## 2017-11-28 LAB — BASIC METABOLIC PANEL
Anion gap: 9 (ref 5–15)
BUN: 14 mg/dL (ref 6–20)
CHLORIDE: 106 mmol/L (ref 101–111)
CO2: 21 mmol/L — ABNORMAL LOW (ref 22–32)
CREATININE: 1.03 mg/dL (ref 0.61–1.24)
Calcium: 8.4 mg/dL — ABNORMAL LOW (ref 8.9–10.3)
GFR calc non Af Amer: >60 mL/min (ref >60)
Glucose, Bld: 93 mg/dL (ref 65–99)
Potassium: 4.1 mmol/L (ref 3.5–5.1)
SODIUM: 136 mmol/L (ref 135–145)

## 2017-11-28 MED ORDER — LISINOPRIL 10 MG PO TABS
10.0000 mg | ORAL_TABLET | Freq: Every day | ORAL | Status: DC
  Administered 2017-11-29 – 2017-12-02 (×4): 10 mg via ORAL
  Filled 2017-11-28 (×4): qty 1

## 2017-11-28 MED ORDER — IOPAMIDOL (ISOVUE-370) INJECTION 76%
INTRAVENOUS | Status: AC
  Administered 2017-11-28: 100 mL
  Filled 2017-11-28: qty 100

## 2017-11-28 NOTE — Progress Notes (Addendum)
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 Left sided ABI not audible, cannot exclude extremely low flow.    RIGHT    LEFT    PRESSURE WAVEFORM  PRESSURE WAVEFORM  BRACHIAL 177 triphasic BRACHIAL 179 triphasic  DP 55 monophasic DP 0 absent  AT   AT    PT 54 monophasic PT 0 absent  PER   PER    GREAT TOE  NA GREAT TOE  NA    RIGHT LEFT  ABI 0.31 0.    Chauncy LeanSturdivant, Zael Shuman D, RVT 11/28/2017, 10:44 AM

## 2017-11-28 NOTE — Consult Note (Addendum)
Hospital Consult    Reason for Consult:  L GT gangrene Requesting Physician:  Dr. Lajoyce Corners MRN #:  [id number]  History of Present Illness: This is a 67 y.o. male seen in consultation for dry gangrene of left GT.  Patient believes tissue changes started about 2 months ago after trauma to his toe.  He has failed several p.o. Antibiotic regimens without improvement.  Workup includes plain film which appears negative for osteomyelitis.  He denies claudication story or rest pain prior to gangrenous changes to toe.  He was a tobacco smoker prior to hospitalization.  Does not take aspirin or statin.  No prior peripheral vascular intervention or imaging studies.  ABIs are pending.  Past Medical History:  Diagnosis Date  . Back pain   . Hypertension     Past Surgical History:  Procedure Laterality Date  . BACK SURGERY    . TOTAL HIP ARTHROPLASTY     bil    No Known Allergies  Prior to Admission medications   Medication Sig Start Date End Date Taking? Authorizing Provider  doxycycline (MONODOX) 100 MG capsule Take 100 mg by mouth 2 (two) times daily. FOR 10 DAYS 11/20/17 11/29/17 Yes [provider]  ibuprofen (ADVIL,MOTRIN) 200 MG tablet Take 600 mg by mouth every 6 (six) hours as needed (for pain).   Yes [provider]  clotrimazole-betamethasone (LOTRISONE) cream Apply to affected area 2 times daily Patient not taking: Reported on 11/26/2017 09/29/17   Ward, Chase Picket, PA-C  gabapentin (NEURONTIN) 100 MG capsule Take 1 capsule (100 mg total) by mouth 3 (three) times daily. Patient not taking: Reported on 11/26/2017 10/08/17   Street, [place], PA-C    Social History   Socioeconomic History  . Marital status: Single    Spouse name: Not on file  . Number of children: Not on file  . Years of education: Not on file  . Highest education level: Not on file  Social Needs  . Financial resource strain: Not on file  . Food insecurity - worry: Not on file  . Food insecurity -  inability: Not on file  . Transportation needs - medical: Not on file  . Transportation needs - non-medical: Not on file  Occupational History  . Not on file  Tobacco Use  . Smoking status: Current Every Day Smoker  . Smokeless tobacco: Never Used  Substance and Sexual Activity  . Alcohol use: No  . Drug use: Yes    Types: Marijuana  . Sexual activity: Not on file  Other Topics Concern  . Not on file  Social History Narrative  . Not on file     History reviewed. No pertinent family history.  ROS: Otherwise negative unless mentioned in HPI  Physical Examination  Vitals:   11/27/17 2129 11/28/17 0458  BP: (!) 162/90 (!) 159/84  Pulse: 75 68  Resp: 18 18  Temp: 99 F (37.2 C) 98.2 F (36.8 C)  SpO2: 96% 100%   There is no height or weight on file to calculate BMI.  General:  WDWN in NAD Gait: Not observed HENT: WNL, normocephalic Pulmonary: normal non-labored breathing Skin: without rashes Vascular Exam/Pulses: no palpable femoral or pedal pulses Extremities: with ischemic changes L GT, with Gangrene L GT,  Edema LLE to level of ankle  Musculoskeletal: no muscle wasting or atrophy  Neurologic: A&O X 3;  No focal weakness or paresthesias are detected; speech is fluent/normal Psychiatric:  The pt has Normal affect. Lymph:  Unremarkable  CBC  Component Value Date/Time   WBC 8.3 11/27/2017 0719   RBC 4.32 11/27/2017 0719   HGB 12.6 (L) 11/27/2017 0719   HCT 38.6 (L) 11/27/2017 0719   PLT 266 11/27/2017 0719   MCV 89.4 11/27/2017 0719   MCH 29.2 11/27/2017 0719   MCHC 32.6 11/27/2017 0719   RDW 14.3 11/27/2017 0719   LYMPHSABS 2.2 11/27/2017 0719   MONOABS 0.6 11/27/2017 0719   EOSABS 0.2 11/27/2017 0719   BASOSABS 0.1 11/27/2017 0719    BMET    Component Value Date/Time   NA 136 11/28/2017 0455   K 4.1 11/28/2017 0455   CL 106 11/28/2017 0455   CO2 21 (L) 11/28/2017 0455   GLUCOSE 93 11/28/2017 0455   BUN 14 11/28/2017 0455   CREATININE 1.03  11/28/2017 0455   CALCIUM 8.4 (L) 11/28/2017 0455   GFRNONAA >60 11/28/2017 0455   GFRAA >60 11/28/2017 0455    COAGS: No results found for: INR, PROTIME   Non-Invasive Vascular Imaging:   ABIs pending CTA aorta with runoff pending  Statin:  No. Beta Blocker:  No. Aspirin:  No. ACEI:  No. ARB:  No. CCB use:  No Other antiplatelets/anticoagulants:  No.    ASSESSMENT/PLAN: This is a 67 y.o. 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 Myra GianottiBrabham was involved in the evaluation and management plan of this patient  Emilie RutterMatthew Eveland PA-C [facility] [phone]   I agree with the above.  I have seen and evaluated the patient.  He has a left great toe wound that has been present for a few months.  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  He is likely going to need an aorto-bifemoral bypass.   --He will need cardiology clearance for surgery --I am ordering carotid dopplers for pre-op evaluation -- will need medical optimization including adding a statin, ASA, and ACE-I to his medical regimen --Surgery will be planned for next week  Wells Evalee Gerard

## 2017-11-28 NOTE — Progress Notes (Signed)
PROGRESS NOTE    Austin [name]  ZOX:[id number] DOB: [birth year] DOA: 11/26/2017 PCP: Patient, No Pcp Per   Brief Narrative: 67 y.o.malewho denies any past medical history though notes he does not see a physician, now presenting to the emergency department for evaluation of blackened left great toe with progressive swelling, erythema, and tenderness involving the left foot. Patient reports that he noted a wound at the left great toe in January, more recently developed swelling, redness, and tenderness involving the left forefoot, was seen in the emergency department for this, and discharged home with doxycycline. Despite the oral antibiotic, the patient reports continued progressive swelling, erythema, and tenderness of the left forefoot. He reports some serous drainage from the great toe. He denies chest pain or palpitations. Denies fevers or chills. No known history of diabetes.  ED Course:Upon arrival to the ED, patient is found to be afebrile, saturating well on room air, and hypertensive to 200/90. Radiographs of the left foot demonstrate diffuse soft tissue swelling involving the forefoot and great toe with no evidence for osteomyelitis, and no change since the prior study. Chemistry panel and CBC are unremarkable, and lactic acid is reassuringly normal. Blood cultures were collected, patient was started on IV antibiotics, and orthopedic surgery was consulted by the ED physician. He remains hypertensive, and no apparent respiratory distress, and will be admitted to the medical-surgical unit for ongoing evaluation and management of left great toe gangrene with surrounding cellulitis. 11/28/2017 also notes endovascular notes reviewed.  Patient on his way down to get CT angiogram.   Assessment & Plan:   Principal Problem:   Gangrene of toe of left foot (HCC) Active Problems:   Hypertension   Cellulitis of foot, left   1.Left great toe gangrene; left foot cellulitis -Presents  with increased swelling and pain involving left foot in setting of non-healing chronic left great toe wound -Reports developing left great toe wound 2 months ago, has since developed swelling and tenderness involving the left forefoot, seen in ED and started on doxycycline, but has experienced continued progression despite this -Toe has findings consistent with dry gangrene with surrounding forefoot cellulitis -There is no fever, leukocytosis, or elevation in lactate on admission -Blood cultures collected in ED, radiographs neg for underlying osteo.  ABI done severe arterial deficiency on the right leg and not audible left-sided ABI.  Await recommendations from vascular surgery. -2.Hypertensive urgency -BP elevated to 200/90 in ED.  BP still elevated increase the dose of lisinopril.  Monitor renal functions on vancomycin. -Pain and anxiety likely contributing; suspect underlying HTN, but pt does have PCP and denies any medical hx -Treat pain, use hydralazine IVP's prn.   DVT prophylaxis: SCD Code Status full code Family Communication: None Disposition Plan TBD Consultants: Ortho, vascular surgery  Procedures: None  Antimicrobials: Vanco and Zosyn.   Subjective:  Objective: Vitals:   11/27/17 1155 11/27/17 1410 11/27/17 2129 11/28/17 0458  BP: (!) 152/61 (!) 124/91 (!) 162/90 (!) 159/84  Pulse:  77 75 68  Resp:  18 18 18   Temp:  98.5 F (36.9 C) 99 F (37.2 C) 98.2 F (36.8 C)  TempSrc:  Oral Oral Oral  SpO2:  100% 96% 100%    Intake/Output Summary (Last 24 hours) at 11/28/2017 1137 Last data filed at 11/28/2017 0957 Gross per 24 hour  Intake 954 ml  Output 1650 ml  Net -696 ml   There were no vitals filed for this visit.  Examination:  General exam: Appears calm and comfortable  Respiratory system: Clear to auscultation. Respiratory effort normal. Cardiovascular system: S1 & S2 heard, RRR. No JVD, murmurs, rubs, gallops or clicks. No pedal  edema. Gastrointestinal system: Abdomen is nondistended, soft and nontender. No organomegaly or masses felt. Normal bowel sounds heard. Central nervous system: Alert and oriented. No focal neurological deficits. Extremities: Left great toe gangrene Skin: No rashes, lesions or ulcers Psychiatry: Judgement and insight appear normal. Mood & affect appropriate.     Data Reviewed: I have personally reviewed following labs and imaging studies  CBC: Recent Labs  Lab 11/26/17 1730 11/27/17 0719  WBC 10.4 8.3  NEUTROABS 7.1 5.3  HGB 13.1 12.6*  HCT 39.1 38.6*  MCV 89.9 89.4  PLT 295 266   Basic Metabolic Panel: Recent Labs  Lab 11/26/17 1730 11/27/17 0719 11/28/17 0455  NA 139 138 136  K 4.0 3.8 4.1  CL 106 108 106  CO2 23 21* 21*  GLUCOSE 102* 92 93  BUN 13 14 14   CREATININE 1.14 0.98 1.03  CALCIUM 8.7* 8.3* 8.4*   GFR: CrCl cannot be calculated (Unknown ideal weight.). Liver Function Tests: Recent Labs  Lab 11/26/17 1730  AST 37  ALT 36  ALKPHOS 70  BILITOT 0.7  PROT 6.8  ALBUMIN 3.2*   No results for input(s): LIPASE, AMYLASE in the last 168 hours. No results for input(s): AMMONIA in the last 168 hours. Coagulation Profile: No results for input(s): INR, PROTIME in the last 168 hours. Cardiac Enzymes: No results for input(s): CKTOTAL, CKMB, CKMBINDEX, TROPONINI in the last 168 hours. BNP (last 3 results) No results for input(s): PROBNP in the last 8760 hours. HbA1C: No results for input(s): HGBA1C in the last 72 hours. CBG: No results for input(s): GLUCAP in the last 168 hours. Lipid Profile: No results for input(s): CHOL, HDL, LDLCALC, TRIG, CHOLHDL, LDLDIRECT in the last 72 hours. Thyroid Function Tests: No results for input(s): TSH, T4TOTAL, FREET4, T3FREE, THYROIDAB in the last 72 hours. Anemia Panel: No results for input(s): VITAMINB12, FOLATE, FERRITIN, TIBC, IRON, RETICCTPCT in the last 72 hours. Sepsis Labs: Recent Labs  Lab 11/26/17 1804   LATICACIDVEN 1.71    Recent Results (from the past 240 hour(s))  Blood culture (routine x 2)     Status: None (Preliminary result)   Collection Time: 11/26/17  5:38 PM  Result Value Ref Range Status   Specimen Description BLOOD LEFT ANTECUBITAL  Final   Special Requests   Final    BOTTLES DRAWN AEROBIC AND ANAEROBIC Blood Culture adequate volume   Culture   Final    NO GROWTH < 24 HOURS Performed at [facility], [address] [place], [place] [address]    Report Status PENDING  Incomplete  Blood culture (routine x 2)     Status: None (Preliminary result)   Collection Time: 11/26/17  9:29 PM  Result Value Ref Range Status   Specimen Description BLOOD LEFT ANTECUBITAL  Final   Special Requests   Final    BOTTLES DRAWN AEROBIC AND ANAEROBIC Blood Culture adequate volume   Culture   Final    NO GROWTH < 24 HOURS Performed at [facility], [address] [place], [place] [address]    Report Status PENDING  Incomplete         Radiology Studies: Ct Angio Ao+bifem W & Or Wo Contrast  Result Date: 11/28/2017 CLINICAL DATA:  67 year old male with critical limb ischemia EXAM: CT ANGIOGRAPHY OF ABDOMINAL AORTA WITH ILIOFEMORAL RUNOFF TECHNIQUE: Multidetector CT  imaging of the abdomen, pelvis and lower extremities was performed using the standard protocol during bolus administration of intravenous contrast. Multiplanar CT image reconstructions and MIPs were obtained to evaluate the vascular anatomy. CONTRAST:  ISOVUE-370 IOPAMIDOL (ISOVUE-370) INJECTION 76% COMPARISON:  09/10/2016 FINDINGS: VASCULAR Aorta: Advanced aortic atherosclerotic changes. Distal thoracic aorta measures 3.0 cm above the hiatus. Irregular calcified and soft plaque of the abdominal aorta with distal aortic occlusion just beyond the inferior mesenteric artery origin. Enlarged lumbar arteries maintain flow into the pelvis. No periaortic fluid. No dissection. Celiac: Mild atherosclerotic changes at the  celiac artery origin. Origin is stenotic with configuration compatible with compression from diaphragmatic crus. Branch vessels are patent. SMA: Superior mesenteric arteries patent with mild atherosclerotic changes. Renals: Right renal artery with mild atherosclerotic changes at the origin. There is approximately 50% stenosis of the left renal artery at the origin secondary to atherosclerotic changes. IMA: Inferior mesenteric artery is patent. Right lower extremity: Occlusion of the common iliac artery across the origin the hypogastric artery. Collateral flow partially fills the hypogastric artery distally within the pelvic vessels. The proximal external iliac artery remains occluded. External iliac artery is reconstituted from inferior epigastric artery. Common femoral artery patent with mild atherosclerotic changes. Profunda femoris patent. Moderate atherosclerotic changes of the right SFA which is patent and without stenosis. Mild popliteal artery changes without stenosis or occlusion. Trifurcation is patent. Anterior tibial artery is patent to the ankle. Posterior tibial artery is patent to the ankle. Mild calcified disease of the proximal peroneal artery, though appears patent to the ankle. Left lower extremity: Occlusion of the left common iliac artery. Occlusion extends across the hypogastric artery. Distal pelvic branches are patent, with no significant filling of the hypogastric artery. External iliac artery is occluded with distal reconstitution secondary to inferior epigastric flow and collateral flow via the lumbar arteries. Mild atherosclerotic changes of the common femoral artery. Profunda femoris patent. Small caliber superficial femoral artery with moderate atherosclerotic changes. Multifocal stenoses within the adductor canal, likely at least 50% stenosis. Moderate atherosclerotic changes of the popliteal artery without occlusion. High origin of the anterior tibial artery, which originates above the  knee joint. Anterior tibial artery is occluded in the distal third. Tibioperoneal trunk is patent. Both the posterior tibial artery and the peroneal artery are occluded in the mid to distal third. Veins: Unremarkable appearance of the venous system. Review of the MIP images confirms the above findings. NON-VASCULAR Lower chest: Respiratory motion somewhat limits evaluation of the lower chest. Emphysema. Hepatobiliary: Unremarkable appearance of liver. Unremarkable gallbladder Pancreas: Unremarkable pancreas Spleen: Unremarkable spleen Adrenals/Urinary Tract: Unremarkable adrenal glands Right: No hydronephrosis. Symmetric perfusion to the left. No nephrolithiasis. Unremarkable course of the right ureter. Left: No hydronephrosis. Symmetric perfusion to the right. No nephrolithiasis. Unremarkable course of the left ureter. Unremarkable appearance of the urinary bladder . Stomach/Bowel: Unremarkable appearance of the stomach. Unremarkable appearance of small bowel. No evidence of obstruction. Colonic diverticular are present without evidence of acute diverticulitis normal appendix. Lymphatic: No adenopathy Mesenteric: No free air or free fluid Reproductive: Unremarkable appearance of the pelvic organs. Other: No hernia. Musculoskeletal: Surgical changes of prior posterior lumbar interbody fusion with bilateral pedicle screw and rod fixation of L4-L5. Advanced disc disease of L5-S1 with disc space obliteration. The appearance is similar to that of the prior CT. Vacuum disc phenomenon of L3-L4. Surgical changes of bilateral hip arthroplasty. Swelling of the bilateral lower legs. IMPRESSION: Aortoiliac disease, with distal occlusion of the aorta extending through the  bilateral common iliac arteries and proximal external iliac arteries. Reconstitution of the femoral arteries occurs on the right via the inferior epigastric artery and on the left predominantly from lumbar/pelvic collaterals. Aortic Atherosclerosis  (ICD10-I70.0). Moderate left femoropopliteal disease with multifocal short stenoses at the adductor canal, and associated left tibial disease, with occlusions of anterior tibial, posterior tibial, and peroneal arteries beyond the origins. Moderate right-sided femoropopliteal disease, with no CT evidence of high-grade stenosis. All 3 right tibial arteries appear patent to the ankle. Emphysema (ICD10-J43.9). Signed, Yvone Neu. Loreta Ave, DO [facility] Radiology Electronically Signed   By: Gilmer Mor D.O.   On: 11/28/2017 11:27   Dg Foot Complete Left  Result Date: 11/26/2017 CLINICAL DATA:  67 year old male with swelling of the great toe and MTP joint. EXAM: LEFT FOOT - COMPLETE 3+ VIEW COMPARISON:  Radiograph dated 11/19/2017 FINDINGS: There is no acute fracture or dislocation. The bones are mildly osteopenic. There is mild hallux valgus. 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 There is diffuse soft tissue swelling of the foot primarily over the forefoot as well as soft tissue swelling of the great toe. No radiopaque foreign object or soft tissue gas. No bone erosion or periosteal elevation to suggest acute osteomyelitis. IMPRESSION: 1. No acute fracture or dislocation. No evidence of acute osteomyelitis by radiograph. 2. Hallux valgus and degenerative changes of the first tarsometatarsal joint. 3. Diffuse soft tissue swelling primarily involving the forefoot and great toe. Overall no significant interval change since the prior radiograph. Electronically Signed   By: Elgie Collard M.D.   On: 11/26/2017 19:28        Scheduled Meds: . lisinopril  5 mg Oral Daily   Continuous Infusions: . cefTRIAXone (ROCEPHIN)  IV 2 g (11/28/17 1052)  . vancomycin Stopped (11/28/17 0027)     LOS: 2 days    Alwyn Ren, MD [facility] If 7PM-7AM, please  contact night-coverage [email or web address] Password [facility] 11/28/2017, 11:37 AM

## 2017-11-29 ENCOUNTER — Inpatient Hospital Stay (HOSPITAL_COMMUNITY): Payer: Medicare Other

## 2017-11-29 LAB — BASIC METABOLIC PANEL
ANION GAP: 9 (ref 5–15)
BUN: 14 mg/dL (ref 6–20)
CHLORIDE: 104 mmol/L (ref 101–111)
CO2: 22 mmol/L (ref 22–32)
Calcium: 8.5 mg/dL — ABNORMAL LOW (ref 8.9–10.3)
Creatinine, Ser: 1.03 mg/dL (ref 0.61–1.24)
GFR calc Af Amer: >60 mL/min (ref >60)
GLUCOSE: 97 mg/dL (ref 65–99)
POTASSIUM: 4 mmol/L (ref 3.5–5.1)
Sodium: 135 mmol/L (ref 135–145)

## 2017-11-29 LAB — VANCOMYCIN, TROUGH: Vancomycin Tr: 8 ug/mL — ABNORMAL LOW (ref 15–20)

## 2017-11-29 MED ORDER — VANCOMYCIN HCL 10 G IV SOLR
1250.0000 mg | Freq: Two times a day (BID) | INTRAVENOUS | Status: DC
  Administered 2017-11-29 – 2017-12-02 (×7): 1250 mg via INTRAVENOUS
  Filled 2017-11-29 (×9): qty 1250

## 2017-11-29 MED ORDER — TECHNETIUM TC 99M TETROFOSMIN IV KIT
30.0000 | PACK | Freq: Once | INTRAVENOUS | Status: AC | PRN
  Administered 2017-11-29: 30 via INTRAVENOUS

## 2017-11-29 MED ORDER — METOPROLOL SUCCINATE ER 50 MG PO TB24
50.0000 mg | ORAL_TABLET | Freq: Every day | ORAL | Status: DC
  Administered 2017-11-29 – 2017-12-03 (×5): 50 mg via ORAL
  Filled 2017-11-29 (×5): qty 1

## 2017-11-29 MED ORDER — TECHNETIUM TC 99M TETROFOSMIN IV KIT
10.0000 | PACK | Freq: Once | INTRAVENOUS | Status: AC | PRN
  Administered 2017-11-29: 10 via INTRAVENOUS

## 2017-11-29 MED ORDER — HYDRALAZINE HCL 20 MG/ML IJ SOLN
10.0000 mg | Freq: Once | INTRAMUSCULAR | Status: AC
  Administered 2017-11-29: 10 mg via INTRAVENOUS

## 2017-11-29 MED ORDER — ENOXAPARIN SODIUM 40 MG/0.4ML ~~LOC~~ SOLN
40.0000 mg | Freq: Every day | SUBCUTANEOUS | Status: DC
  Administered 2017-11-29 – 2017-12-02 (×3): 40 mg via SUBCUTANEOUS
  Filled 2017-11-29 (×4): qty 0.4

## 2017-11-29 MED ORDER — HYDRALAZINE HCL 20 MG/ML IJ SOLN
INTRAMUSCULAR | Status: AC
  Administered 2017-11-29: 20 mg
  Filled 2017-11-29: qty 1

## 2017-11-29 MED ORDER — REGADENOSON 0.4 MG/5ML IV SOLN
INTRAVENOUS | Status: AC
  Filled 2017-11-29: qty 5

## 2017-11-29 MED ORDER — ATORVASTATIN CALCIUM 80 MG PO TABS
80.0000 mg | ORAL_TABLET | Freq: Every day | ORAL | Status: DC
  Administered 2017-11-29 – 2017-12-02 (×4): 80 mg via ORAL
  Filled 2017-11-29 (×4): qty 1

## 2017-11-29 MED ORDER — ASPIRIN 81 MG PO CHEW
81.0000 mg | CHEWABLE_TABLET | Freq: Every day | ORAL | Status: DC
  Administered 2017-11-29 – 2017-12-02 (×3): 81 mg via ORAL
  Filled 2017-11-29 (×4): qty 1

## 2017-11-29 MED ORDER — REGADENOSON 0.4 MG/5ML IV SOLN
0.4000 mg | Freq: Once | INTRAVENOUS | Status: AC
  Administered 2017-11-29: 0.4 mg via INTRAVENOUS
  Filled 2017-11-29: qty 5

## 2017-11-29 NOTE — Progress Notes (Addendum)
PROGRESS NOTE    Austin [name]  ZOX:[id number] DOB: 03/16/1951 DOA: 11/26/2017 PCP: Patient, No Pcp Per  Brief Narrative:67 y.o.malewho denies any past medical history though notes he does not see a physician, now presenting to the emergency department for evaluation of blackened left great toe with progressive swelling, erythema, and tenderness involving the left foot. Patient reports that he noted a wound at the left great toe in January, more recently developed swelling, redness, and tenderness involving the left forefoot, was seen in the emergency department for this, and discharged home with doxycycline. Despite the oral antibiotic, the patient reports continued progressive swelling, erythema, and tenderness of the left forefoot. He reports some serous drainage from the great toe. He denies chest pain or palpitations. Denies fevers or chills. No known history of diabetes.  ED Course:Upon arrival to the ED, patient is found to be afebrile, saturating well on room air, and hypertensive to 200/90. Radiographs of the left foot demonstrate diffuse soft tissue swelling involving the forefoot and great toe with no evidence for osteomyelitis, and no change since the prior study. Chemistry panel and CBC are unremarkable, and lactic acid is reassuringly normal. Blood cultures were collected, patient was started on IV antibiotics, and orthopedic surgery was consulted by the ED physician. He remains hypertensive, and no apparent respiratory distress, and will be admitted to the medical-surgical unit for ongoing evaluation and management of left great toe gangrene with surrounding cellulitis.    Assessment & Plan:   Principal Problem:   Gangrene of toe of left foot (HCC) Active Problems:   Hypertension   Cellulitis of foot, left 1.Left great toe gangrene; left foot cellulitis -Presents with increased swelling and pain involving left foot in setting of non-healing chronic left great toe  wound -Reports developing left great toe wound 2 months ago, has since developed swelling and tenderness involving the left forefoot, seen in ED and started on doxycycline, but has experienced continued progression despite this -Toe has findings consistent with dry gangrene with surrounding forefoot cellulitis -There is no fever, leukocytosis, or elevation in lactate on admission -Blood cultures negative so far.radiographs neg for underlying osteo. CT angiogram showedAortoiliac disease, with distal occlusion of the aorta extending through the bilateral common iliac arteries and proximal external iliac arteries. Reconstitution of the femoral arteries occurs on the right via the inferior epigastric artery and on the left predominantly from lumbar/pelvic collaterals. Aortic Atherosclerosis (ICD10-I70.0).  Moderate left femoropopliteal disease with multifocal short stenoses at the adductor canal, and associated left tibial disease, with occlusions of anterior tibial, posterior tibial, and peroneal arteries beyond the origins.  Moderate right-sided femoropopliteal disease, with no CT evidence of high-grade stenosis. All 3 right tibial arteries appear patent to the ankle.  2.Hypertensive urgency -BP elevated to 200/90 in ED -Pain and anxiety likely contributing; suspect underlying HTN, but pt does have PCP and denies any medical hx -Treat pain, use hydralazine IVP's prn.  BP better controlled after increasing the dose of Zestril.    3] history of COPD longtime smoker for 55 years.-stable.  4]?  Diastolic CHF with uncontrolled hypertension- patient with bilateral pitting edema-with history of bilateral peripheral artery disease.  Will order echocardiogram today check for EF.  However his chest sounds clear.  Cardiology consulted for cardiac clearance.  Patient to have stress test today.  Keep patient n.p.o. today.  Patient may eat after stress test unless there is another  procedure or test planned.  I have not given him any diuretics so far.  His creatinine at the time of admission was 0.98 up to 1.03 yesterday and remains at 1.03 today.  Monitor daily renal functions patient has had CT scan with contrast.       DVT prophylaxis: lovenox Code Statusfull Family Communication: none  Disposition Plan:  tbd  Consultants: Ortho, vascular surgery, cardiology  Procedures: None Vanco and Zosyn Antimicrobials  Subjective: Feels okay has a lot of questions about procedures and surgery that he is going to have.  Objective: Vitals:   11/28/17 1513 11/28/17 2230 11/28/17 2300 11/29/17 0507  BP: (!) 158/62 (!) 173/62 (!) 150/73 (!) 149/79  Pulse: 68 69  65  Resp: 17 18  18   Temp: 98.5 F (36.9 C) 98.9 F (37.2 C)  98.4 F (36.9 C)  TempSrc: Oral Oral  Oral  SpO2: 100% 98%  100%  Weight:        Intake/Output Summary (Last 24 hours) at 11/29/2017 0758 Last data filed at 11/29/2017 [id number] Gross per 24 hour  Intake 620 ml  Output 2200 ml  Net -1580 ml   Filed Weights   11/28/17 1059  Weight: 92.5 kg (204 lb)    Examination:  General exam: Appears calm and comfortable  Respiratory system: Clear to auscultation. Respiratory effort normal. Cardiovascular system: S1 & S2 heard, RRR. No JVD, murmurs, rubs, gallops or clicks. No pedal edema. Gastrointestinal system: Abdomen is nondistended, soft and nontender. No organomegaly or masses felt. Normal bowel sounds heard. Central nervous system: Alert and oriented. No focal neurological deficits. Extremities: Symmetric 5 x 5 power. Skin: No rashes, lesions or ulcers Psychiatry: Judgement and insight appear normal. Mood & affect appropriate.     Data Reviewed: I have personally reviewed following labs and imaging studies  CBC: Recent Labs  Lab 11/26/17 1730 11/27/17 0719  WBC 10.4 8.3  NEUTROABS 7.1 5.3  HGB 13.1 12.6*  HCT 39.1 38.6*  MCV 89.9 89.4  PLT 295 266   Basic Metabolic  Panel: Recent Labs  Lab 11/26/17 1730 11/27/17 0719 11/28/17 0455 11/29/17 0509  NA 139 138 136 135  K 4.0 3.8 4.1 4.0  CL 106 108 106 104  CO2 23 21* 21* 22  GLUCOSE 102* 92 93 97  BUN 13 14 14 14   CREATININE 1.14 0.98 1.03 1.03  CALCIUM 8.7* 8.3* 8.4* 8.5*   GFR: Estimated Creatinine Clearance: 77.6 mL/min (by C-G formula based on SCr of 1.03 mg/dL). Liver Function Tests: Recent Labs  Lab 11/26/17 1730  AST 37  ALT 36  ALKPHOS 70  BILITOT 0.7  PROT 6.8  ALBUMIN 3.2*   No results for input(s): LIPASE, AMYLASE in the last 168 hours. No results for input(s): AMMONIA in the last 168 hours. Coagulation Profile: No results for input(s): INR, PROTIME in the last 168 hours. Cardiac Enzymes: No results for input(s): CKTOTAL, CKMB, CKMBINDEX, TROPONINI in the last 168 hours. BNP (last 3 results) No results for input(s): PROBNP in the last 8760 hours. HbA1C: No results for input(s): HGBA1C in the last 72 hours. CBG: No results for input(s): GLUCAP in the last 168 hours. Lipid Profile: No results for input(s): CHOL, HDL, LDLCALC, TRIG, CHOLHDL, LDLDIRECT in the last 72 hours. Thyroid Function Tests: No results for input(s): TSH, T4TOTAL, FREET4, T3FREE, THYROIDAB in the last 72 hours. Anemia Panel: No results for input(s): VITAMINB12, FOLATE, FERRITIN, TIBC, IRON, RETICCTPCT in the last 72 hours. Sepsis Labs: Recent Labs  Lab 11/26/17 1804  LATICACIDVEN 1.71    Recent Results (from the past 240 hour(s))  Blood culture (routine x 2)     Status: None (Preliminary result)   Collection Time: 11/26/17  5:38 PM  Result Value Ref Range Status   Specimen Description BLOOD LEFT ANTECUBITAL  Final   Special Requests   Final    BOTTLES DRAWN AEROBIC AND ANAEROBIC Blood Culture adequate volume   Culture   Final    NO GROWTH 2 DAYS Performed at [facility], [address] [place], [place] [address]    Report Status PENDING  Incomplete  Blood culture (routine x 2)      Status: None (Preliminary result)   Collection Time: 11/26/17  9:29 PM  Result Value Ref Range Status   Specimen Description BLOOD LEFT ANTECUBITAL  Final   Special Requests   Final    BOTTLES DRAWN AEROBIC AND ANAEROBIC Blood Culture adequate volume   Culture   Final    NO GROWTH 2 DAYS Performed at [facility], [address], [place], [place] [address]    Report Status PENDING  Incomplete         Radiology Studies: Ct Angio Ao+bifem W & Or Wo Contrast  Result Date: 11/28/2017 CLINICAL DATA:  67 year old male with critical limb ischemia EXAM: CT ANGIOGRAPHY OF ABDOMINAL AORTA WITH ILIOFEMORAL RUNOFF TECHNIQUE: Multidetector CT imaging of the abdomen, pelvis and lower extremities was performed using the standard protocol during bolus administration of intravenous contrast. Multiplanar CT image reconstructions and MIPs were obtained to evaluate the vascular anatomy. CONTRAST:  ISOVUE-370 IOPAMIDOL (ISOVUE-370) INJECTION 76% COMPARISON:  09/10/2016 FINDINGS: VASCULAR Aorta: Advanced aortic atherosclerotic changes. Distal thoracic aorta measures 3.0 cm above the hiatus. Irregular calcified and soft plaque of the abdominal aorta with distal aortic occlusion just beyond the inferior mesenteric artery origin. Enlarged lumbar arteries maintain flow into the pelvis. No periaortic fluid. No dissection. Celiac: Mild atherosclerotic changes at the celiac artery origin. Origin is stenotic with configuration compatible with compression from diaphragmatic crus. Branch vessels are patent. SMA: Superior mesenteric arteries patent with mild atherosclerotic changes. Renals: Right renal artery with mild atherosclerotic changes at the origin. There is approximately 50% stenosis of the left renal artery at the origin secondary to atherosclerotic changes. IMA: Inferior mesenteric artery is patent. Right lower extremity: Occlusion of the common iliac artery across the origin the hypogastric artery.  Collateral flow partially fills the hypogastric artery distally within the pelvic vessels. The proximal external iliac artery remains occluded. External iliac artery is reconstituted from inferior epigastric artery. Common femoral artery patent with mild atherosclerotic changes. Profunda femoris patent. Moderate atherosclerotic changes of the right SFA which is patent and without stenosis. Mild popliteal artery changes without stenosis or occlusion. Trifurcation is patent. Anterior tibial artery is patent to the ankle. Posterior tibial artery is patent to the ankle. Mild calcified disease of the proximal peroneal artery, though appears patent to the ankle. Left lower extremity: Occlusion of the left common iliac artery. Occlusion extends across the hypogastric artery. Distal pelvic branches are patent, with no significant filling of the hypogastric artery. External iliac artery is occluded with distal reconstitution secondary to inferior epigastric flow and collateral flow via the lumbar arteries. Mild atherosclerotic changes of the common femoral artery. Profunda femoris patent. Small caliber superficial femoral artery with moderate atherosclerotic changes. Multifocal stenoses within the adductor canal, likely at least 50% stenosis. Moderate atherosclerotic changes of the popliteal artery without occlusion. High origin of the anterior tibial artery, which originates above the knee joint. Anterior tibial artery is occluded in  the distal third. Tibioperoneal trunk is patent. Both the posterior tibial artery and the peroneal artery are occluded in the mid to distal third. Veins: Unremarkable appearance of the venous system. Review of the MIP images confirms the above findings. NON-VASCULAR Lower chest: Respiratory motion somewhat limits evaluation of the lower chest. Emphysema. Hepatobiliary: Unremarkable appearance of liver. Unremarkable gallbladder Pancreas: Unremarkable pancreas Spleen: Unremarkable spleen  Adrenals/Urinary Tract: Unremarkable adrenal glands Right: No hydronephrosis. Symmetric perfusion to the left. No nephrolithiasis. Unremarkable course of the right ureter. Left: No hydronephrosis. Symmetric perfusion to the right. No nephrolithiasis. Unremarkable course of the left ureter. Unremarkable appearance of the urinary bladder . Stomach/Bowel: Unremarkable appearance of the stomach. Unremarkable appearance of small bowel. No evidence of obstruction. Colonic diverticular are present without evidence of acute diverticulitis normal appendix. Lymphatic: No adenopathy Mesenteric: No free air or free fluid Reproductive: Unremarkable appearance of the pelvic organs. Other: No hernia. Musculoskeletal: Surgical changes of prior posterior lumbar interbody fusion with bilateral pedicle screw and rod fixation of L4-L5. Advanced disc disease of L5-S1 with disc space obliteration. The appearance is similar to that of the prior CT. Vacuum disc phenomenon of L3-L4. Surgical changes of bilateral hip arthroplasty. Swelling of the bilateral lower legs. IMPRESSION: Aortoiliac disease, with distal occlusion of the aorta extending through the bilateral common iliac arteries and proximal external iliac arteries. Reconstitution of the femoral arteries occurs on the right via the inferior epigastric artery and on the left predominantly from lumbar/pelvic collaterals. Aortic Atherosclerosis (ICD10-I70.0). Moderate left femoropopliteal disease with multifocal short stenoses at the adductor canal, and associated left tibial disease, with occlusions of anterior tibial, posterior tibial, and peroneal arteries beyond the origins. Moderate right-sided femoropopliteal disease, with no CT evidence of high-grade stenosis. All 3 right tibial arteries appear patent to the ankle. Emphysema (ICD10-J43.9). Signed, Yvone Neu. Loreta Ave, DO [facility] Electronically Signed   By: Gilmer Mor  D.O.   On: 11/28/2017 11:27        Scheduled Meds: . lisinopril  10 mg Oral Daily   Continuous Infusions: . cefTRIAXone (ROCEPHIN)  IV Stopped (11/28/17 1122)  . vancomycin Stopped (11/29/17 0045)     LOS: 3 days      Alwyn Ren, MD [facility]  If 7PM-7AM, please contact night-coverage [email or web address] Password [facility] 11/29/2017, 7:58 AM

## 2017-11-29 NOTE — Progress Notes (Signed)
Stress test findings noted. Large inferior ischemia. 1-2% MACCE risk for vascular bypass surgery. Continue aspirin, lipitor, metoprolol. If critical coronary stenosis treated with PCI, dual antiplatelet therapy for at least 3-6 months would be necessary. Will discuss with vascular surgery, if that would be acceptable from perioperative bleeding risk standpoint.  Elder NegusManish J Costas Sena, MD [facility] Pager: [phone] Office: [phone] If no answer Cell [phone]

## 2017-11-29 NOTE — Consult Note (Addendum)
Reason for Consult: Pre-op cardiac risk stratification Referring Physician: Jacki Cones, MD  Austin [name] is an 67 y.o. male.  HPI:   67 year old African-American male with no regular medical follow-up, untreated hypertension, admitted with critical limb ischemia with left great toe dry gangrene. CTA showed aortic occlusion. He is going to undergo peripheral angiography and possible aortobifemoral bypass. Cardiology were consulted for preoperative risk stratification.  Patient's baseline is limited functional capacity due to his severe claudication. With his limited activity, he denies any chest pain, shortness of breath. He is on pack a day smoker for many years. Blood pressure is elevated, not on any medications. EKG shows LVH with strain pattern.   Past Medical History:  Diagnosis Date  . Back pain   . Hypertension     Past Surgical History:  Procedure Laterality Date  . BACK SURGERY    . TOTAL HIP ARTHROPLASTY     bil    History reviewed. No pertinent family history.  Social History:  reports that he has been smoking.  he has never used smokeless tobacco. He reports that he uses drugs. Drug: Marijuana. He reports that he does not drink alcohol.  Allergies: No Known Allergies  Medications: I have reviewed the patient's current medications.  Results for orders placed or performed during the hospital encounter of 11/26/17 (from the past 48 hour(s))  Basic metabolic panel     Status: Abnormal   Collection Time: 11/28/17  4:55 AM  Result Value Ref Range   Sodium 136 135 - 145 mmol/L   Potassium 4.1 3.5 - 5.1 mmol/L   Chloride 106 101 - 111 mmol/L   CO2 21 (L) 22 - 32 mmol/L   Glucose, Bld 93 65 - 99 mg/dL   BUN 14 6 - 20 mg/dL   Creatinine, Ser 1.03 0.61 - 1.24 mg/dL   Calcium 8.4 (L) 8.9 - 10.3 mg/dL   GFR calc non Af Amer >60 >60 mL/min   GFR calc Af Amer >60 >60 mL/min    Comment: (NOTE) The eGFR has been calculated using the CKD EPI equation. This  calculation has not been validated in all clinical situations. eGFR's persistently <60 mL/min signify possible Chronic Kidney Disease.    Anion gap 9 5 - 15    Comment: Performed at [facility] [address], [place], [place] [address]  Basic metabolic panel     Status: Abnormal   Collection Time: 11/29/17  5:09 AM  Result Value Ref Range   Sodium 135 135 - 145 mmol/L   Potassium 4.0 3.5 - 5.1 mmol/L   Chloride 104 101 - 111 mmol/L   CO2 22 22 - 32 mmol/L   Glucose, Bld 97 65 - 99 mg/dL   BUN 14 6 - 20 mg/dL   Creatinine, Ser 1.03 0.61 - 1.24 mg/dL   Calcium 8.5 (L) 8.9 - 10.3 mg/dL   GFR calc non Af Amer >60 >60 mL/min   GFR calc Af Amer >60 >60 mL/min    Comment: (NOTE) The eGFR has been calculated using the CKD EPI equation. This calculation has not been validated in all clinical situations. eGFR's persistently <60 mL/min signify possible Chronic Kidney Disease.    Anion gap 9 5 - 15    Comment: Performed at [facility] [address], [place], [place] [address]    Ct Angio Ao+bifem W & Or Wo Contrast  Result Date: 11/28/2017 CLINICAL DATA:  67 year old male with critical limb ischemia EXAM: CT ANGIOGRAPHY OF  ABDOMINAL AORTA WITH ILIOFEMORAL RUNOFF TECHNIQUE: Multidetector CT imaging of the abdomen, pelvis and lower extremities was performed using the standard protocol during bolus administration of intravenous contrast. Multiplanar CT image reconstructions and MIPs were obtained to evaluate the vascular anatomy. CONTRAST:  181m ISOVUE-370 IOPAMIDOL (ISOVUE-370) INJECTION 76% COMPARISON:  09/10/2016 FINDINGS: VASCULAR Aorta: Advanced aortic atherosclerotic changes. Distal thoracic aorta measures 3.0 cm above the hiatus. Irregular calcified and soft plaque of the abdominal aorta with distal aortic occlusion just beyond the inferior mesenteric artery origin. Enlarged lumbar arteries maintain flow into the pelvis. No periaortic fluid. No dissection. Celiac: Mild  atherosclerotic changes at the celiac artery origin. Origin is stenotic with configuration compatible with compression from diaphragmatic crus. Branch vessels are patent. SMA: Superior mesenteric arteries patent with mild atherosclerotic changes. Renals: Right renal artery with mild atherosclerotic changes at the origin. There is approximately 50% stenosis of the left renal artery at the origin secondary to atherosclerotic changes. IMA: Inferior mesenteric artery is patent. Right lower extremity: Occlusion of the common iliac artery across the origin the hypogastric artery. Collateral flow partially fills the hypogastric artery distally within the pelvic vessels. The proximal external iliac artery remains occluded. External iliac artery is reconstituted from inferior epigastric artery. Common femoral artery patent with mild atherosclerotic changes. Profunda femoris patent. Moderate atherosclerotic changes of the right SFA which is patent and without stenosis. Mild popliteal artery changes without stenosis or occlusion. Trifurcation is patent. Anterior tibial artery is patent to the ankle. Posterior tibial artery is patent to the ankle. Mild calcified disease of the proximal peroneal artery, though appears patent to the ankle. Left lower extremity: Occlusion of the left common iliac artery. Occlusion extends across the hypogastric artery. Distal pelvic branches are patent, with no significant filling of the hypogastric artery. External iliac artery is occluded with distal reconstitution secondary to inferior epigastric flow and collateral flow via the lumbar arteries. Mild atherosclerotic changes of the common femoral artery. Profunda femoris patent. Small caliber superficial femoral artery with moderate atherosclerotic changes. Multifocal stenoses within the adductor canal, likely at least 50% stenosis. Moderate atherosclerotic changes of the popliteal artery without occlusion. High origin of the anterior tibial  artery, which originates above the knee joint. Anterior tibial artery is occluded in the distal third. Tibioperoneal trunk is patent. Both the posterior tibial artery and the peroneal artery are occluded in the mid to distal third. Veins: Unremarkable appearance of the venous system. Review of the MIP images confirms the above findings. NON-VASCULAR Lower chest: Respiratory motion somewhat limits evaluation of the lower chest. Emphysema. Hepatobiliary: Unremarkable appearance of liver. Unremarkable gallbladder Pancreas: Unremarkable pancreas Spleen: Unremarkable spleen Adrenals/Urinary Tract: Unremarkable adrenal glands Right: No hydronephrosis. Symmetric perfusion to the left. No nephrolithiasis. Unremarkable course of the right ureter. Left: No hydronephrosis. Symmetric perfusion to the right. No nephrolithiasis. Unremarkable course of the left ureter. Unremarkable appearance of the urinary bladder . Stomach/Bowel: Unremarkable appearance of the stomach. Unremarkable appearance of small bowel. No evidence of obstruction. Colonic diverticular are present without evidence of acute diverticulitis normal appendix. Lymphatic: No adenopathy Mesenteric: No free air or free fluid Reproductive: Unremarkable appearance of the pelvic organs. Other: No hernia. Musculoskeletal: Surgical changes of prior posterior lumbar interbody fusion with bilateral pedicle screw and rod fixation of L4-L5. Advanced disc disease of L5-S1 with disc space obliteration. The appearance is similar to that of the prior CT. Vacuum disc phenomenon of L3-L4. Surgical changes of bilateral hip arthroplasty. Swelling of the bilateral lower legs. IMPRESSION: Aortoiliac disease, with  distal occlusion of the aorta extending through the bilateral common iliac arteries and proximal external iliac arteries. Reconstitution of the femoral arteries occurs on the right via the inferior epigastric artery and on the left predominantly from lumbar/pelvic collaterals.  Aortic Atherosclerosis (ICD10-I70.0). Moderate left femoropopliteal disease with multifocal short stenoses at the adductor canal, and associated left tibial disease, with occlusions of anterior tibial, posterior tibial, and peroneal arteries beyond the origins. Moderate right-sided femoropopliteal disease, with no CT evidence of high-grade stenosis. All 3 right tibial arteries appear patent to the ankle. Emphysema (ICD10-J43.9). Signed, Dulcy Fanny. Earleen Newport, DO [facility] Electronically Signed   By: Corrie Mckusick D.O.   On: 11/28/2017 11:27    Review of Systems  Constitutional: Negative for malaise/fatigue.  HENT: Negative.   Eyes: Negative.   Respiratory: Negative for shortness of breath.   Cardiovascular: Positive for leg swelling. Negative for chest pain, orthopnea and PND.       Severe bilateral claudication, left great toe gangrene  Gastrointestinal: Negative for abdominal pain and nausea.  Genitourinary: Negative.   Skin: Negative.   Neurological: Negative for dizziness and loss of consciousness.  Endo/Heme/Allergies: Does not bruise/bleed easily.  Psychiatric/Behavioral: Negative.   All other systems reviewed and are negative.  Blood pressure (!) 175/68, pulse 75, temperature 98.4 F (36.9 C), temperature source Oral, resp. rate 18, weight 92.5 kg (204 lb), SpO2 100 %. Physical Exam  Nursing note and vitals reviewed. Constitutional: He is oriented to person, place, and time. He appears well-developed and well-nourished.  HENT:  Head: Normocephalic.  Eyes: Conjunctivae are normal. Pupils are equal, round, and reactive to light.  Neck: Normal range of motion. Neck supple. No JVD present.  Cardiovascular: Normal rate, regular rhythm and normal heart sounds.  No murmur heard. Absent left lower extremity pulses. Feeble pulses RTLE. Bilateral 2+ pitting edema. Left great toe  dry gangrene. No carotid bruit.   Respiratory: Effort  normal and breath sounds normal. He has no wheezes. He has no rales.  Musculoskeletal: He exhibits edema (2+).  Lymphadenopathy:    He has no cervical adenopathy.  Neurological: He is alert and oriented to person, place, and time. No cranial nerve deficit.  Skin: Skin is warm and dry.  Psychiatric: He has a normal mood and affect.    Assessment: 67 year old African-American male  Left lower extremity critical limb ischemia, left great toe dry gangrene Severe bilateral lower extremity peripheral artery disease, distal abdominal aorta occlusion Untreated uncontrolled hypertension Tobacco abuse  Recommendations: Aspirin 81 mg, Lipitor 80 mg, metoprolol succinate 50 mg daily Recommend echocardiogram and nuclear stress test, given high risk for CAD and poor baseline functional status Further recommendations after stress test.   Nashiya Disbrow J Kegan Shepardson 11/29/2017, 11:12 AM   Trevious Rampey Esther Hardy, MD [facility] [place] Pager: [phone] Office: [phone] If no answer Cell [phone]

## 2017-11-29 NOTE — Progress Notes (Signed)
Pharmacy Antibiotic Note  Austin DurandStephen [name] is a 67 y.o. male admitted on 11/26/2017 with cellulitis.  Pharmacy has been consulted for vancomycin dosing.  Vancomycin trough prior to 5th dose is sub-therapeutic at 8 mcg/mL on 1g IV every 12 hours.   SCr is stable at 1.03 with normalized CrCl~72 ml/min.  Patient remains afebrile and WBC is trending down.  Cultures are no growth x2 days.  Plan: Increase Vancomycin to 1250 mg IV every 12 hours.  Follow renal function, cultures and clinical progress  Weight: 204 lb (92.5 kg)(Patient reported)  Temp (24hrs), Avg:98.6 F (37 C), Min:98.4 F (36.9 C), Max:98.9 F (37.2 C)  Recent Labs  Lab 11/26/17 1730 11/26/17 1804 11/27/17 0719 11/28/17 0455 11/29/17 0509 11/29/17 1221  WBC 10.4  --  8.3  --   --   --   CREATININE 1.14  --  0.98 1.03 1.03  --   LATICACIDVEN  --  1.71  --   --   --   --   VANCOTROUGH  --   --   --   --   --  8*    Estimated Creatinine Clearance: 77.6 mL/min (by C-G formula based on SCr of 1.03 mg/dL).    No Known Allergies   Thank you for allowing pharmacy to be a part of this patient's care.  Link SnufferJessica Anothony Bursch, PharmD, BCPS, BCCCP Clinical Pharmacist Clinical phone 11/29/2017 until 3:30PM [phone]- #[phone] After hours, please call [phone] 11/29/2017,2:16 PM

## 2017-11-30 ENCOUNTER — Inpatient Hospital Stay (HOSPITAL_COMMUNITY): Payer: Medicare Other

## 2017-11-30 DIAGNOSIS — I350 Nonrheumatic aortic (valve) stenosis: Secondary | ICD-10-CM

## 2017-11-30 DIAGNOSIS — I749 Embolism and thrombosis of unspecified artery: Secondary | ICD-10-CM

## 2017-11-30 LAB — BASIC METABOLIC PANEL
ANION GAP: 8 (ref 5–15)
BUN: 13 mg/dL (ref 6–20)
CALCIUM: 8.4 mg/dL — AB (ref 8.9–10.3)
CO2: 22 mmol/L (ref 22–32)
Chloride: 106 mmol/L (ref 101–111)
Creatinine, Ser: 1.05 mg/dL (ref 0.61–1.24)
GFR calc Af Amer: >60 mL/min (ref >60)
Glucose, Bld: 130 mg/dL — ABNORMAL HIGH (ref 65–99)
Potassium: 3.7 mmol/L (ref 3.5–5.1)
SODIUM: 136 mmol/L (ref 135–145)

## 2017-11-30 LAB — LIPID PANEL
CHOLESTEROL: 106 mg/dL (ref 0–200)
HDL: 42 mg/dL (ref >40)
LDL Cholesterol: 55 mg/dL (ref 0–99)
Total CHOL/HDL Ratio: 2.5 RATIO
Triglycerides: 43 mg/dL (ref <150)
VLDL: 9 mg/dL (ref 0–40)

## 2017-11-30 LAB — ECHOCARDIOGRAM COMPLETE: WEIGHTICAEL: 3264 [oz_av]

## 2017-11-30 MED ORDER — SODIUM CHLORIDE 0.9% FLUSH: 10.0000 mL | INTRAVENOUS | Status: DC | PRN

## 2017-11-30 NOTE — Plan of Care (Signed)
Patient is advancing in care plan goals.

## 2017-11-30 NOTE — Progress Notes (Signed)
2D Echocardiogram has been performed.  Pieter PartridgeBrooke S Opal Dinning 11/30/2017, 9:29 AM

## 2017-11-30 NOTE — Progress Notes (Signed)
Patient spent the evening in the recliner.  Voided a large amount of clear yellow urine using the urinal.  Right AC PIV used for KVO infusion and Vancomycin.  No signs or symptoms of infiltration or infection.  Patient medicated with 2 Norco/acetaminaphen for complaints of left foot great toe pain that came on suddenly.  When reassessed patient said that it was still hurting just a little bit.  Comfort and safety maintained this shift, will continue to monitorl.

## 2017-11-30 NOTE — Progress Notes (Signed)
PROGRESS NOTE    Austin [name]  KZS:[id number] DOB: [birth year] DOA: 11/26/2017 PCP: Patient, No Pcp Per   Brief Narrative: 67 y.o.malewho denies any past medical history though notes he does not see a physician, now presenting to the emergency department for evaluation of blackened left great toe with progressive swelling, erythema, and tenderness involving the left foot. Patient reports that he noted a wound at the left great toe in January, more recently developed swelling, redness, and tenderness involving the left forefoot, was seen in the emergency department for this, and discharged home with doxycycline. Despite the oral antibiotic, the patient reports continued progressive swelling, erythema, and tenderness of the left forefoot. He reports some serous drainage from the great toe. He denies chest pain or palpitations. Denies fevers or chills. No known history of diabetes.  ED Course:Upon arrival to the ED, patient is found to be afebrile, saturating well on room air, and hypertensive to 200/90. Radiographs of the left foot demonstrate diffuse soft tissue swelling involving the forefoot and great toe with no evidence for osteomyelitis, and no change since the prior study. Chemistry panel and CBC are unremarkable, and lactic acid is reassuringly normal. Blood cultures were collected, patient was started on IV antibiotics, and orthopedic surgery was consulted by the ED physician. He remains hypertensive, and no apparent respiratory distress, and will be admitted to the medical-surgical unit for ongoing evaluation and management of left great toe gangrene with surrounding cellulitis.    Assessment & Plan:   Principal Problem:   Gangrene of toe of left foot (HCC) Active Problems:   Hypertension   Cellulitis of foot, left  1.Left great toe gangrene; left foot cellulitis -Presents with increased swelling and pain involving left foot in setting of non-healing chronic left great  toe wound -Reports developing left great toe wound 2 months ago, has since developed swelling and tenderness involving the left forefoot, seen in ED and started on doxycycline, but has experienced continued progression despite this -Toe has findings consistent with dry gangrene with surrounding forefoot cellulitis -There is no fever, leukocytosis, or elevation in lactate on admission -Blood cultures negative so far.radiographs neg for underlying osteo. CT angiogram showedAortoiliac disease, with distal occlusion of the aorta extending through the bilateral common iliac arteries and proximal external iliac arteries. Reconstitution of the femoral arteries occurs on the right via the inferior epigastric artery and on the left predominantly from lumbar/pelvic collaterals. Aortic Atherosclerosis (ICD10-I70.0).  Moderate left femoropopliteal disease with multifocal short stenoses at the adductor canal, and associated left tibial disease, with occlusions of anterior tibial, posterior tibial, and peroneal arteries beyond the origins.  Moderate right-sided femoropopliteal disease, with no CT evidence of high-grade stenosis. All 3 right tibial arteries appear patent to the ankle.  2.Hypertensive urgency -BP elevated to 200/90 in ED -Pain and anxiety likely contributing; suspect underlying HTN, but pt does have PCP and denies any medical hx -Treat pain, use hydralazine IVP's prn.  BP better controlled after increasing the dose of Zestril.continue metoprolol.  3] history of COPD longtime smoker for 55 years.-stable.  4]?  Diastolic CHF with uncontrolled hypertension- patient with bilateral pitting edema-with history of bilateral peripheral artery disease.  Will order echocardiogram today check for EF.  However his chest sounds clear.  Cardiology consulted for cardiac clearance.  Patient had  stress test inferior wall ischemia.  His creatinine at the time of admission was 0.98 up  to 1.05 yesterday and remains at 1.03 today.  Monitor daily renal functions patient has had  CT scan with contrast.      DVT prophylaxis: Lovenox Code Status: Full code Family Communication: None Disposition Plan TBD Consultants: Driscilla Grammesrth O, cardiology Procedures: None Antimicrobials: Vanco and Rocephin  Subjective: No new complaints.  Waiting to speak with cardiologist.  Objective: Vitals:   11/29/17 1526 11/29/17 1618 11/29/17 2129 11/30/17 0545  BP: (!) 172/75 (!) 168/82 (!) 141/60 (!) 144/66  Pulse:   70 (!) 57  Resp:   18 17  Temp:   98.6 F (37 C)   TempSrc:   Oral   SpO2:   100% 96%  Weight:        Intake/Output Summary (Last 24 hours) at 11/30/2017 0849 Last data filed at 11/30/2017 0635 Gross per 24 hour  Intake 1030 ml  Output 300 ml  Net 730 ml   Filed Weights   11/28/17 1059  Weight: 92.5 kg (204 lb)    Examination:  General exam: Appears calm and comfortable  Respiratory system: Clear to auscultation. Respiratory effort normal. Cardiovascular system: S1 & S2 heard, RRR. No JVD, murmurs, rubs, gallops or clicks. No pedal edema. Gastrointestinal system: Abdomen is nondistended, soft and nontender. No organomegaly or masses felt. Normal bowel sounds heard. Central nervous system: Alert and oriented. No focal neurological deficits. Extremities: Symmetric 5 x 5 power. Skin: No rashes, lesions or ulcers Psychiatry: Judgement and insight appear normal. Mood & affect appropriate.     Data Reviewed: I have personally reviewed following labs and imaging studies  CBC: Recent Labs  Lab 11/26/17 1730 11/27/17 0719  WBC 10.4 8.3  NEUTROABS 7.1 5.3  HGB 13.1 12.6*  HCT 39.1 38.6*  MCV 89.9 89.4  PLT 295 266   Basic Metabolic Panel: Recent Labs  Lab 11/26/17 1730 11/27/17 0719 11/28/17 0455 11/29/17 0509 11/30/17 0613  NA 139 138 136 135 136  K 4.0 3.8 4.1 4.0 3.7  CL 106 108 106 104 106  CO2 23 21* 21* 22 22  GLUCOSE 102* 92 93 97 130*  BUN 13  14 14 14 13   CREATININE 1.14 0.98 1.03 1.03 1.05  CALCIUM 8.7* 8.3* 8.4* 8.5* 8.4*   GFR: Estimated Creatinine Clearance: 76.1 mL/min (by C-G formula based on SCr of 1.05 mg/dL). Liver Function Tests: Recent Labs  Lab 11/26/17 1730  AST 37  ALT 36  ALKPHOS 70  BILITOT 0.7  PROT 6.8  ALBUMIN 3.2*   No results for input(s): LIPASE, AMYLASE in the last 168 hours. No results for input(s): AMMONIA in the last 168 hours. Coagulation Profile: No results for input(s): INR, PROTIME in the last 168 hours. Cardiac Enzymes: No results for input(s): CKTOTAL, CKMB, CKMBINDEX, TROPONINI in the last 168 hours. BNP (last 3 results) No results for input(s): PROBNP in the last 8760 hours. HbA1C: No results for input(s): HGBA1C in the last 72 hours. CBG: No results for input(s): GLUCAP in the last 168 hours. Lipid Profile: Recent Labs    11/30/17 0613  CHOL 106  HDL 42  LDLCALC 55  TRIG 43  CHOLHDL 2.5   Thyroid Function Tests: No results for input(s): TSH, T4TOTAL, FREET4, T3FREE, THYROIDAB in the last 72 hours. Anemia Panel: No results for input(s): VITAMINB12, FOLATE, FERRITIN, TIBC, IRON, RETICCTPCT in the last 72 hours. Sepsis Labs: Recent Labs  Lab 11/26/17 1804  LATICACIDVEN 1.71    Recent Results (from the past 240 hour(s))  Blood culture (routine x 2)     Status: None (Preliminary result)   Collection Time: 11/26/17  5:38 PM  Result  Value Ref Range Status   Specimen Description BLOOD LEFT ANTECUBITAL  Final   Special Requests   Final    BOTTLES DRAWN AEROBIC AND ANAEROBIC Blood Culture adequate volume   Culture   Final    NO GROWTH 3 DAYS Performed at [facility], [address] [place], [place] [address]    Report Status PENDING  Incomplete  Blood culture (routine x 2)     Status: None (Preliminary result)   Collection Time: 11/26/17  9:29 PM  Result Value Ref Range Status   Specimen Description BLOOD LEFT ANTECUBITAL  Final   Special Requests   Final      BOTTLES DRAWN AEROBIC AND ANAEROBIC Blood Culture adequate volume   Culture   Final    NO GROWTH 3 DAYS Performed at [facility], [address], [place], [place] [address]    Report Status PENDING  Incomplete         Radiology Studies: Ct Angio Ao+bifem W & Or Wo Contrast  Result Date: 11/28/2017 CLINICAL DATA:  67 year old male with critical limb ischemia EXAM: CT ANGIOGRAPHY OF ABDOMINAL AORTA WITH ILIOFEMORAL RUNOFF TECHNIQUE: Multidetector CT imaging of the abdomen, pelvis and lower extremities was performed using the standard protocol during bolus administration of intravenous contrast. Multiplanar CT image reconstructions and MIPs were obtained to evaluate the vascular anatomy. CONTRAST:  ISOVUE-370 IOPAMIDOL (ISOVUE-370) INJECTION 76% COMPARISON:  09/10/2016 FINDINGS: VASCULAR Aorta: Advanced aortic atherosclerotic changes. Distal thoracic aorta measures 3.0 cm above the hiatus. Irregular calcified and soft plaque of the abdominal aorta with distal aortic occlusion just beyond the inferior mesenteric artery origin. Enlarged lumbar arteries maintain flow into the pelvis. No periaortic fluid. No dissection. Celiac: Mild atherosclerotic changes at the celiac artery origin. Origin is stenotic with configuration compatible with compression from diaphragmatic crus. Branch vessels are patent. SMA: Superior mesenteric arteries patent with mild atherosclerotic changes. Renals: Right renal artery with mild atherosclerotic changes at the origin. There is approximately 50% stenosis of the left renal artery at the origin secondary to atherosclerotic changes. IMA: Inferior mesenteric artery is patent. Right lower extremity: Occlusion of the common iliac artery across the origin the hypogastric artery. Collateral flow partially fills the hypogastric artery distally within the pelvic vessels. The proximal external iliac artery remains occluded. External iliac artery is reconstituted from  inferior epigastric artery. Common femoral artery patent with mild atherosclerotic changes. Profunda femoris patent. Moderate atherosclerotic changes of the right SFA which is patent and without stenosis. Mild popliteal artery changes without stenosis or occlusion. Trifurcation is patent. Anterior tibial artery is patent to the ankle. Posterior tibial artery is patent to the ankle. Mild calcified disease of the proximal peroneal artery, though appears patent to the ankle. Left lower extremity: Occlusion of the left common iliac artery. Occlusion extends across the hypogastric artery. Distal pelvic branches are patent, with no significant filling of the hypogastric artery. External iliac artery is occluded with distal reconstitution secondary to inferior epigastric flow and collateral flow via the lumbar arteries. Mild atherosclerotic changes of the common femoral artery. Profunda femoris patent. Small caliber superficial femoral artery with moderate atherosclerotic changes. Multifocal stenoses within the adductor canal, likely at least 50% stenosis. Moderate atherosclerotic changes of the popliteal artery without occlusion. High origin of the anterior tibial artery, which originates above the knee joint. Anterior tibial artery is occluded in the distal third. Tibioperoneal trunk is patent. Both the posterior tibial artery and the peroneal artery are occluded in the mid to  distal third. Veins: Unremarkable appearance of the venous system. Review of the MIP images confirms the above findings. NON-VASCULAR Lower chest: Respiratory motion somewhat limits evaluation of the lower chest. Emphysema. Hepatobiliary: Unremarkable appearance of liver. Unremarkable gallbladder Pancreas: Unremarkable pancreas Spleen: Unremarkable spleen Adrenals/Urinary Tract: Unremarkable adrenal glands Right: No hydronephrosis. Symmetric perfusion to the left. No nephrolithiasis. Unremarkable course of the right ureter. Left: No hydronephrosis.  Symmetric perfusion to the right. No nephrolithiasis. Unremarkable course of the left ureter. Unremarkable appearance of the urinary bladder . Stomach/Bowel: Unremarkable appearance of the stomach. Unremarkable appearance of small bowel. No evidence of obstruction. Colonic diverticular are present without evidence of acute diverticulitis normal appendix. Lymphatic: No adenopathy Mesenteric: No free air or free fluid Reproductive: Unremarkable appearance of the pelvic organs. Other: No hernia. Musculoskeletal: Surgical changes of prior posterior lumbar interbody fusion with bilateral pedicle screw and rod fixation of L4-L5. Advanced disc disease of L5-S1 with disc space obliteration. The appearance is similar to that of the prior CT. Vacuum disc phenomenon of L3-L4. Surgical changes of bilateral hip arthroplasty. Swelling of the bilateral lower legs. IMPRESSION: Aortoiliac disease, with distal occlusion of the aorta extending through the bilateral common iliac arteries and proximal external iliac arteries. Reconstitution of the femoral arteries occurs on the right via the inferior epigastric artery and on the left predominantly from lumbar/pelvic collaterals. Aortic Atherosclerosis (ICD10-I70.0). Moderate left femoropopliteal disease with multifocal short stenoses at the adductor canal, and associated left tibial disease, with occlusions of anterior tibial, posterior tibial, and peroneal arteries beyond the origins. Moderate right-sided femoropopliteal disease, with no CT evidence of high-grade stenosis. All 3 right tibial arteries appear patent to the ankle. Emphysema (ICD10-J43.9). Signed, Yvone Neu. Loreta Ave, DO [facility] Electronically Signed   By: Gilmer Mor D.O.   On: 11/28/2017 11:27   Nm Myocar Multi W/spect W/wall Motion / Ef  Result Date: 11/29/2017 CLINICAL DATA:  Preoperative evaluation, high risk for surgery EXAM: MYOCARDIAL IMAGING WITH  SPECT (REST AND PHARMACOLOGIC-STRESS) GATED LEFT VENTRICULAR WALL MOTION STUDY LEFT VENTRICULAR EJECTION FRACTION TECHNIQUE: Standard myocardial SPECT imaging was performed after resting intravenous injection of 10 mCi Tc-46m tetrofosmin. Subsequently, intravenous infusion of Lexiscan was performed under the supervision of the Cardiology staff. At peak effect of the drug, 30 mCi Tc-49m tetrofosmin was injected intravenously and standard myocardial SPECT imaging was performed. Quantitative gated imaging was also performed to evaluate left ventricular wall motion, and estimate left ventricular ejection fraction. COMPARISON:  None. FINDINGS: Perfusion: Inferolateral defect on both rest and stress imaging however the defect is larger on stress imaging concerning for inferolateral peri-infarct inducible ischemia with pharmacologic stress. Wall Motion: Grossly normal wall motion.  No LV chamber dilatation. Left Ventricular Ejection Fraction: 51 % End diastolic volume 125 ml End systolic volume 61 ml IMPRESSION: 1. Larger inferolateral defect on stress imaging concerning for inferolateral peri-infarct inducible ischemia with pharmacologic stress. 2. Grossly normal wall motion. 3. Left ventricular ejection fraction 51% 4. Non invasive risk stratification*: Intermediate *2012 Appropriate Use Criteria for Coronary Revascularization Focused Update: J Am Coll Cardiol. 2012;59(9):857-881. http://content.dementiazones.com.aspx?articleid=[email or web address] Electronically Signed   By: Judie Petit.  Shick M.D.   On: 11/29/2017 15:24        Scheduled Meds: . aspirin  81 mg Oral Daily  . atorvastatin  80 mg Oral q1800  . enoxaparin (LOVENOX) injection  40 mg Subcutaneous Daily  . lisinopril  10 mg Oral Daily  . metoprolol succinate  50 mg Oral Daily   Continuous Infusions: .  cefTRIAXone (ROCEPHIN)  IV Stopped (11/29/17 1121)  . vancomycin Stopped (11/30/17 0059)     LOS: 4 days     Alwyn Ren, MD [facility]  If 7PM-7AM, please contact night-coverage [email or web address] Password [facility] 11/30/2017, 8:49 AM

## 2017-11-30 NOTE — Progress Notes (Signed)
Subjective:  Feels well. Has foot pain. No chest pain, shortness of breath  Blood pressure improved.  Objective:  Vital Signs in the last 24 hours: Temp:  [98.4 F (36.9 C)-98.6 F (37 C)] 98.6 F (37 C) (03/16 2129) Pulse Rate:  [57-79] 57 (03/17 0545) Resp:  [17-19] 17 (03/17 0545) BP: (141-174)/(60-84) 144/66 (03/17 0545) SpO2:  [96 %-100 %] 96 % (03/17 0545)  Intake/Output from previous day: 03/16 0701 - 03/17 0700 In: 1030 [P.O.:480; IV Piggyback:550] Out: 300 [Urine:300] Intake/Output from this shift: No intake/output data recorded.  Physical Exam: Nursing note and vitals reviewed. Constitutional: He is oriented to person, place, and time. He appears well-developed and well-nourished.  HENT:  Head: Normocephalic.  Eyes: Conjunctivae are normal. Pupils are equal, round, and reactive to light.  Neck: Normal range of motion. Neck supple. No JVD present.  Cardiovascular: Normal rate, regular rhythm and normal heart sounds.  No murmur heard. Absent left lower extremity pulses. Feeble pulses RTLE. Bilateral 2+ pitting edema. Left great toe  dry gangrene. No carotid bruit.  Respiratory: Effort normal and breath sounds normal. He has no wheezes. He has no rales.  Musculoskeletal: He exhibits edema (2+).  Lymphadenopathy:    He has no cervical adenopathy.  Neurological: He is alert and oriented to person, place, and time. No cranial nerve deficit.  Skin: Skin is warm and dry.  Psychiatric: He has a normal mood and affect.      Lab Results: No results for input(s): WBC, HGB, PLT in the last 72 hours. Recent Labs    11/29/17 0509 11/30/17 0613  NA 135 136  K 4.0 3.7  CL 104 106  CO2 22 22  GLUCOSE 97 130*  BUN 14 13  CREATININE 1.03 1.05   No results for input(s): TROPONINI in the last 72 hours.  Invalid input(s): CK, MB Hepatic Function Panel No results for input(s): PROT, ALBUMIN, AST, ALT, ALKPHOS, BILITOT, BILIDIR, IBILI in the last 72 hours. Recent Labs     11/30/17 0613  CHOL 106   No results for input(s): PROTIME in the last 72 hours.  Imaging: Imaging results have been reviewed  B/l ABI 11/28/2017: Final Interpretation: Right: Resting right ankle-brachial index indicates severe right lower extremity arterial disease. Left: Resting left ankle-brachial index indicates critical left limb ischemia. No audible Doppler signal on the left side. Cannot exclude extremely low flow.  Venous duplex ultrasound 11/19/2017: Final Interpretation: Right: No evidence of common femoral vein obstruction. Left: Ultrasound characteristics of enlarged lymph node measuring 1 cm high by 3.8 cm wide by 3.9 cm long noted in the groin. There is no evidence of deep vein thrombosis in the lower extremity.There is no evidence of superficial venous thrombosis. No  cystic structure found in the popliteal fossa.  CTA AO + Bifem 11/28/2017: Final Interpretation: Right: No evidence of common femoral vein obstruction. Left: Ultrasound characteristics of enlarged lymph node measuring 1 cm high by 3.8 cm wide by 3.9 cm long noted in the groin. There is no evidence of deep vein thrombosis in the lower extremity.There is no evidence of superficial venous thrombosis. No  cystic structure found in the popliteal fossa.   Cardiac Studies: EKG 11/29/2017 14:17 Sinus rhythm 69 bpm. Left axis deviation. Left ventricular hypertrophy. Possible old anteroseptal infarct. Lateral T-wave inversions could be due to LVH. Cannot exclude ischemia.  Echocardiogram 11/29/2017: Study Conclusions  - Left ventricle: The cavity size was normal. There was moderate   concentric hypertrophy. The estimated ejection fraction was  in   the range of 65% to 70%. Mild hypokinesis. Mild hypokinesis of   the midinferior myocardium. Features are consistent with grade II   diastolic dysfunction, elevated LAP. - Left atrium: The atrium was mildly dilated. - Pulmonary arteries: PA peak pressure: 40  mm Hg (S).  Lexiscan nuclear stress test 11/29/2017: IMPRESSION: 1. Larger inferolateral defect on stress imaging concerning for inferolateral peri-infarct inducible ischemia with pharmacologic stress.  2. Grossly normal wall motion.  3. Left ventricular ejection fraction 51%  4. Non invasive risk stratification*: Intermediate   Assessment: 67 year old African-American male  Left lower extremity critical limb ischemia, left great toe dry gangrene Severe bilateral lower extremity peripheral artery disease, distal abdominal aorta occlusion Untreated uncontrolled hypertension Tobacco abuse Abnormal stress test: Large inferior peri infarct ischemia, intermediate risk study  Recommendations: High pretest probability for CAD with intermediate risk stress test. As per 2014 ACC/AHA perioperative cardiac risk stratification guidelines, recommend aspirin 81 mg, high dose stain-Lipitor 80 mg, and beta blocker-metoprolol succinate 50 mg daily. I have discussed the situation with vascular surgeon Dr. Myra GianottiBrabham in detail. It is reasonable to perform coronary angiogram for further cardiac risk stratification. If severe multivessel CAD, may need cardiac revascularization first. If focal critical stenosis, could consider bare-metal stent, dual antiplatelet therapy with aspirin and Plavix. This would delay vascular surgery by at least 4 weeks. I will further communicate to Dr. Myra GianottiBrabham after performing diagnostic coronary angiography.  I have discussed the rationale/risks/benefits/alternate options of the coronary angiography and possible intervention in detail with the patient.   Will proceed with cath on Monday 12/01/17 morning. NPO after midnight.    LOS: 4 days    Jaryn Hocutt J Rolland Steinert 11/30/2017, 11:43 AM  Allice Garro Emiliano DyerJ Baker Kogler, MD [facility] Pager: [phone] Office: [phone] If no answer Cell [phone]

## 2017-11-30 NOTE — Progress Notes (Signed)
Subjective  -   No complaints   Physical Exam:  Femoral and pedal pulses are nonpalpable Left great toe ischemia Abdomen is soft with no surgical scars     RIGHT    LEFT    PRESSURE WAVEFORM  PRESSURE WAVEFORM  BRACHIAL 177 triphasic BRACHIAL 179 triphasic  DP 55 monophasic DP 0 absent  AT   AT    PT 54 monophasic PT 0 absent  PER   PER    GREAT TOE  NA GREAT TOE  NA    RIGHT LEFT  ABI 0.31 0.    CT angiogram abdomen pelvis with runoff:  Aortoiliac disease, with distal occlusion of the aorta extending through the bilateral common iliac arteries and proximal external iliac arteries. Reconstitution of the femoral arteries occurs on the right via the inferior epigastric artery and on the left predominantly from lumbar/pelvic collaterals. Aortic Atherosclerosis (ICD10-I70.0).  Moderate left femoropopliteal disease with multifocal short stenoses at the adductor canal, and associated left tibial disease, with occlusions of anterior tibial, posterior tibial, and peroneal arteries beyond the origins.  Moderate right-sided femoropopliteal disease, with no CT evidence of high-grade stenosis. All 3 right tibial arteries appear patent to the ankle.  Emphysema (ICD10-J43.9).    Assessment/Plan:   I had a lengthy discussion with the patient today, Going over his CT angiogram which shows aortic occlusion.  In order to heal a toe amputation, he is going to need revascularization.  Without revascularization he would likely end up with limb loss.  I discussed the details of the procedure.  Before proceeding, he is going to need to undergo a full cardiology workup.  I am also going to check carotid Dopplers.  We will follow-up once the studies have been completed.  Wells Shadi Sessler 11/30/2017 1:00 AM --  Vitals:   11/29/17 1618 11/29/17 2129  BP: (!) 168/82 (!) 141/60  Pulse:  70  Resp:  18  Temp:  98.6 F (37 C)  SpO2:  100%    Intake/Output  Summary (Last 24 hours) at 11/30/2017 0100 Last data filed at 11/29/2017 1700 Gross per 24 hour  Intake 540 ml  Output 900 ml  Net -360 ml     Laboratory CBC    Component Value Date/Time   WBC 8.3 11/27/2017 0719   HGB 12.6 (L) 11/27/2017 0719   HCT 38.6 (L) 11/27/2017 0719   PLT 266 11/27/2017 0719    BMET    Component Value Date/Time   NA 135 11/29/2017 0509   K 4.0 11/29/2017 0509   CL 104 11/29/2017 0509   CO2 22 11/29/2017 0509   GLUCOSE 97 11/29/2017 0509   BUN 14 11/29/2017 0509   CREATININE 1.03 11/29/2017 0509   CALCIUM 8.5 (L) 11/29/2017 0509   GFRNONAA >60 11/29/2017 0509   GFRAA >60 11/29/2017 0509    COAG No results found for: INR, PROTIME No results found for: PTT  Antibiotics Anti-infectives (From admission, onward)   Start     Dose/Rate Route Frequency Ordered Stop   11/29/17 2330  vancomycin (VANCOCIN) 1,250 mg in sodium chloride 0.9 % 250 mL IVPB     1,250 mg 166.7 mL/hr over 90 Minutes Intravenous Every 12 hours 11/29/17 1419     11/27/17 1130  vancomycin (VANCOCIN) IVPB 1000 mg/200 mL premix  Status:  Discontinued     1,000 mg 200 mL/hr over 60 Minutes Intravenous Every 12 hours 11/26/17 2239 11/29/17 1419   11/27/17 1030  cefTRIAXone (ROCEPHIN) 2 g in sodium  chloride 0.9 % 100 mL IVPB     2 g 200 mL/hr over 30 Minutes Intravenous Every 24 hours 11/27/17 0935     11/27/17 0930  cefTRIAXone (ROCEPHIN) injection 2 g  Status:  Discontinued     2 g Intramuscular Every 24 hours 11/27/17 0929 11/27/17 0933   11/26/17 2300  vancomycin (VANCOCIN) 2,000 mg in sodium chloride 0.9 % 500 mL IVPB     2,000 mg 250 mL/hr over 120 Minutes Intravenous  Once 11/26/17 2239 11/27/17 0411   11/26/17 2130  vancomycin (VANCOCIN) 2,000 mg in sodium chloride 0.9 % 500 mL IVPB  Status:  Discontinued     2,000 mg 250 mL/hr over 120 Minutes Intravenous  Once 11/26/17 2108 11/26/17 2128   11/26/17 2115  piperacillin-tazobactam (ZOSYN) IVPB 3.375 g  Status:  Discontinued      3.375 g 100 mL/hr over 30 Minutes Intravenous  Once 11/26/17 2108 11/26/17 2128       V. Charlena Cross, M.D. [facility] Office: [phone] Pager:  [phone]

## 2017-11-30 NOTE — Progress Notes (Signed)
VASCULAR LAB PRELIMINARY  PRELIMINARY  PRELIMINARY  PRELIMINARY  Carotid duplex completed.    Preliminary report:  Right:40-59% ICA stenosis.  Left: 40-59% ICA stenosis, distally.  Unable to insonate vertebral artery flow secondary to patient's need to dangle leg because of ischemic leg pain.   Danial Hlavac, RVT 11/30/2017, 11:31 AM

## 2017-11-30 NOTE — H&P (View-Only) (Signed)
Subjective:  Feels well. Has foot pain. No chest pain, shortness of breath  Blood pressure improved.  Objective:  Vital Signs in the last 24 hours: Temp:  [98.4 F (36.9 C)-98.6 F (37 C)] 98.6 F (37 C) (03/16 2129) Pulse Rate:  [57-79] 57 (03/17 0545) Resp:  [17-19] 17 (03/17 0545) BP: (141-174)/(60-84) 144/66 (03/17 0545) SpO2:  [96 %-100 %] 96 % (03/17 0545)  Intake/Output from previous day: 03/16 0701 - 03/17 0700 In: 1030 [P.O.:480; IV Piggyback:550] Out: 300 [Urine:300] Intake/Output from this shift: No intake/output data recorded.  Physical Exam: Nursing note and vitals reviewed. Constitutional: He is oriented to person, place, and time. He appears well-developed and well-nourished.  HENT:  Head: Normocephalic.  Eyes: Conjunctivae are normal. Pupils are equal, round, and reactive to light.  Neck: Normal range of motion. Neck supple. No JVD present.  Cardiovascular: Normal rate, regular rhythm and normal heart sounds.  No murmur heard. Absent left lower extremity pulses. Feeble pulses RTLE. Bilateral 2+ pitting edema. Left great toe  dry gangrene. No carotid bruit.  Respiratory: Effort normal and breath sounds normal. He has no wheezes. He has no rales.  Musculoskeletal: He exhibits edema (2+).  Lymphadenopathy:    He has no cervical adenopathy.  Neurological: He is alert and oriented to person, place, and time. No cranial nerve deficit.  Skin: Skin is warm and dry.  Psychiatric: He has a normal mood and affect.      Lab Results: No results for input(s): WBC, HGB, PLT in the last 72 hours. Recent Labs    11/29/17 0509 11/30/17 0613  NA 135 136  K 4.0 3.7  CL 104 106  CO2 22 22  GLUCOSE 97 130*  BUN 14 13  CREATININE 1.03 1.05   No results for input(s): TROPONINI in the last 72 hours.  Invalid input(s): CK, MB Hepatic Function Panel No results for input(s): PROT, ALBUMIN, AST, ALT, ALKPHOS, BILITOT, BILIDIR, IBILI in the last 72 hours. Recent Labs     11/30/17 0613  CHOL 106   No results for input(s): PROTIME in the last 72 hours.  Imaging: Imaging results have been reviewed  B/l ABI 11/28/2017: Final Interpretation: Right: Resting right ankle-brachial index indicates severe right lower extremity arterial disease. Left: Resting left ankle-brachial index indicates critical left limb ischemia. No audible Doppler signal on the left side. Cannot exclude extremely low flow.  Venous duplex ultrasound 11/19/2017: Final Interpretation: Right: No evidence of common femoral vein obstruction. Left: Ultrasound characteristics of enlarged lymph node measuring 1 cm high by 3.8 cm wide by 3.9 cm long noted in the groin. There is no evidence of deep vein thrombosis in the lower extremity.There is no evidence of superficial venous thrombosis. No  cystic structure found in the popliteal fossa.  CTA AO + Bifem 11/28/2017: Final Interpretation: Right: No evidence of common femoral vein obstruction. Left: Ultrasound characteristics of enlarged lymph node measuring 1 cm high by 3.8 cm wide by 3.9 cm long noted in the groin. There is no evidence of deep vein thrombosis in the lower extremity.There is no evidence of superficial venous thrombosis. No  cystic structure found in the popliteal fossa.   Cardiac Studies: EKG 11/29/2017 14:17 Sinus rhythm 69 bpm. Left axis deviation. Left ventricular hypertrophy. Possible old anteroseptal infarct. Lateral T-wave inversions could be due to LVH. Cannot exclude ischemia.  Echocardiogram 11/29/2017: Study Conclusions  - Left ventricle: The cavity size was normal. There was moderate   concentric hypertrophy. The estimated ejection fraction was  in   the range of 65% to 70%. Mild hypokinesis. Mild hypokinesis of   the midinferior myocardium. Features are consistent with grade II   diastolic dysfunction, elevated LAP. - Left atrium: The atrium was mildly dilated. - Pulmonary arteries: PA peak pressure: 40  mm Hg (S).  Lexiscan nuclear stress test 11/29/2017: IMPRESSION: 1. Larger inferolateral defect on stress imaging concerning for inferolateral peri-infarct inducible ischemia with pharmacologic stress.  2. Grossly normal wall motion.  3. Left ventricular ejection fraction 51%  4. Non invasive risk stratification*: Intermediate   Assessment: 67 year old African-American male  Left lower extremity critical limb ischemia, left great toe dry gangrene Severe bilateral lower extremity peripheral artery disease, distal abdominal aorta occlusion Untreated uncontrolled hypertension Tobacco abuse Abnormal stress test: Large inferior peri infarct ischemia, intermediate risk study  Recommendations: High pretest probability for CAD with intermediate risk stress test. As per 2014 ACC/AHA perioperative cardiac risk stratification guidelines, recommend aspirin 81 mg, high dose stain-Lipitor 80 mg, and beta blocker-metoprolol succinate 50 mg daily. I have discussed the situation with vascular surgeon Dr. Myra GianottiBrabham in detail. It is reasonable to perform coronary angiogram for further cardiac risk stratification. If severe multivessel CAD, may need cardiac revascularization first. If focal critical stenosis, could consider bare-metal stent, dual antiplatelet therapy with aspirin and Plavix. This would delay vascular surgery by at least 4 weeks. I will further communicate to Dr. Myra GianottiBrabham after performing diagnostic coronary angiography.  I have discussed the rationale/risks/benefits/alternate options of the coronary angiography and possible intervention in detail with the patient.   Will proceed with cath on Monday 12/01/17 morning. NPO after midnight.    LOS: 4 days    Biana Haggar J Damarri Rampy 11/30/2017, 11:43 AM  Ayoub Arey Emiliano DyerJ Tzippy Testerman, MD [facility] Pager: [phone] Office: [phone] If no answer Cell [phone]

## 2017-12-01 ENCOUNTER — Encounter (HOSPITAL_COMMUNITY): Payer: Self-pay | Admitting: Cardiology

## 2017-12-01 ENCOUNTER — Inpatient Hospital Stay (HOSPITAL_COMMUNITY)
Admission: EM | Disposition: A | Discharge status: Home Health | Payer: Self-pay | Source: Home / Self Care | Attending: Surgery

## 2017-12-01 HISTORY — PX: LEFT HEART CATH AND CORONARY ANGIOGRAPHY: CATH118249

## 2017-12-01 LAB — BASIC METABOLIC PANEL
ANION GAP: 6 (ref 5–15)
BUN: 17 mg/dL (ref 6–20)
CHLORIDE: 110 mmol/L (ref 101–111)
CO2: 23 mmol/L (ref 22–32)
Calcium: 8.3 mg/dL — ABNORMAL LOW (ref 8.9–10.3)
Creatinine, Ser: 1.03 mg/dL (ref 0.61–1.24)
GFR calc Af Amer: >60 mL/min (ref >60)
Glucose, Bld: 107 mg/dL — ABNORMAL HIGH (ref 65–99)
POTASSIUM: 4.1 mmol/L (ref 3.5–5.1)
SODIUM: 139 mmol/L (ref 135–145)

## 2017-12-01 LAB — PROTIME-INR
INR: 1.03
PROTHROMBIN TIME: 13.4 s (ref 11.4–15.2)

## 2017-12-01 LAB — CULTURE, BLOOD (ROUTINE X 2)
CULTURE: NO GROWTH
CULTURE: NO GROWTH
Special Requests: ADEQUATE
Special Requests: ADEQUATE

## 2017-12-01 SURGERY — LEFT HEART CATH AND CORONARY ANGIOGRAPHY
Anesthesia: LOCAL

## 2017-12-01 MED ORDER — FENTANYL CITRATE (PF) 100 MCG/2ML IJ SOLN
INTRAMUSCULAR | Status: DC | PRN
  Administered 2017-12-01 (×3): 50 ug via INTRAVENOUS

## 2017-12-01 MED ORDER — HEPARIN SODIUM (PORCINE) 1000 UNIT/ML IJ SOLN
INTRAMUSCULAR | Status: DC | PRN
  Administered 2017-12-01: 4500 [IU] via INTRAVENOUS

## 2017-12-01 MED ORDER — FENTANYL CITRATE (PF) 100 MCG/2ML IJ SOLN
INTRAMUSCULAR | Status: AC
  Filled 2017-12-01: qty 2

## 2017-12-01 MED ORDER — HYDROMORPHONE HCL 1 MG/ML IJ SOLN
0.5000 mg | INTRAMUSCULAR | Status: DC | PRN
  Administered 2017-12-01: 0.5 mg via INTRAVENOUS
  Filled 2017-12-01: qty 1

## 2017-12-01 MED ORDER — SODIUM CHLORIDE 0.9 % IV SOLN
INTRAVENOUS | Status: DC
  Administered 2017-12-01: 07:00:00 via INTRAVENOUS

## 2017-12-01 MED ORDER — ASPIRIN 81 MG PO CHEW
81.0000 mg | CHEWABLE_TABLET | ORAL | Status: AC
  Administered 2017-12-01: 81 mg via ORAL

## 2017-12-01 MED ORDER — SODIUM CHLORIDE 0.9 % IV SOLN: INTRAVENOUS | Status: DC

## 2017-12-01 MED ORDER — HEPARIN (PORCINE) IN NACL 2-0.9 UNIT/ML-% IJ SOLN
INTRAMUSCULAR | Status: AC
  Filled 2017-12-01: qty 1000

## 2017-12-01 MED ORDER — HYDRALAZINE HCL 20 MG/ML IJ SOLN
INTRAMUSCULAR | Status: DC | PRN
  Administered 2017-12-01: 10 mg via INTRAVENOUS

## 2017-12-01 MED ORDER — ACETAMINOPHEN 325 MG PO TABS: 650.0000 mg | ORAL_TABLET | ORAL | Status: DC | PRN

## 2017-12-01 MED ORDER — AMLODIPINE BESYLATE 10 MG PO TABS
10.0000 mg | ORAL_TABLET | Freq: Every day | ORAL | Status: DC
  Administered 2017-12-01 – 2017-12-02 (×2): 10 mg via ORAL
  Filled 2017-12-01 (×2): qty 1

## 2017-12-01 MED ORDER — SODIUM CHLORIDE 0.9% FLUSH
3.0000 mL | Freq: Two times a day (BID) | INTRAVENOUS | Status: DC
  Administered 2017-12-01: 3 mL via INTRAVENOUS

## 2017-12-01 MED ORDER — HEPARIN SODIUM (PORCINE) 1000 UNIT/ML IJ SOLN
INTRAMUSCULAR | Status: AC
  Filled 2017-12-01: qty 1

## 2017-12-01 MED ORDER — HYDRALAZINE HCL 20 MG/ML IJ SOLN
INTRAMUSCULAR | Status: AC
  Filled 2017-12-01: qty 1

## 2017-12-01 MED ORDER — LIDOCAINE HCL (PF) 1 % IJ SOLN
INTRAMUSCULAR | Status: AC
  Filled 2017-12-01: qty 30

## 2017-12-01 MED ORDER — IOPAMIDOL (ISOVUE-370) INJECTION 76%
INTRAVENOUS | Status: DC | PRN
  Administered 2017-12-01: 55 mL via INTRA_ARTERIAL

## 2017-12-01 MED ORDER — IOPAMIDOL (ISOVUE-370) INJECTION 76%
INTRAVENOUS | Status: AC
  Filled 2017-12-01: qty 100

## 2017-12-01 MED ORDER — MIDAZOLAM HCL 2 MG/2ML IJ SOLN
INTRAMUSCULAR | Status: AC
  Filled 2017-12-01: qty 2

## 2017-12-01 MED ORDER — VERAPAMIL HCL 2.5 MG/ML IV SOLN
INTRAVENOUS | Status: AC
  Filled 2017-12-01: qty 2

## 2017-12-01 MED ORDER — HEPARIN (PORCINE) IN NACL 2-0.9 UNIT/ML-% IJ SOLN
INTRAMUSCULAR | Status: AC | PRN
  Administered 2017-12-01 (×2): 500 mL

## 2017-12-01 MED ORDER — SODIUM CHLORIDE 0.9 % IV SOLN
250.0000 mL | INTRAVENOUS | Status: DC | PRN
  Administered 2017-12-03: 12:00:00 via INTRAVENOUS

## 2017-12-01 MED ORDER — SODIUM CHLORIDE 0.9% FLUSH: 3.0000 mL | Freq: Two times a day (BID) | INTRAVENOUS | Status: DC

## 2017-12-01 MED ORDER — HEPARIN (PORCINE) IN NACL 2-0.9 UNIT/ML-% IJ SOLN
INTRAMUSCULAR | Status: DC | PRN
  Administered 2017-12-01: 10 mL via INTRA_ARTERIAL

## 2017-12-01 MED ORDER — LIDOCAINE HCL (PF) 1 % IJ SOLN
INTRAMUSCULAR | Status: DC | PRN
  Administered 2017-12-01: 2 mL

## 2017-12-01 MED ORDER — SODIUM CHLORIDE 0.9% FLUSH: 3.0000 mL | INTRAVENOUS | Status: DC | PRN

## 2017-12-01 MED ORDER — ONDANSETRON HCL 4 MG/2ML IJ SOLN: 4.0000 mg | Freq: Four times a day (QID) | INTRAMUSCULAR | Status: DC | PRN

## 2017-12-01 MED ORDER — MIDAZOLAM HCL 2 MG/2ML IJ SOLN
INTRAMUSCULAR | Status: DC | PRN
  Administered 2017-12-01 (×3): 1 mg via INTRAVENOUS

## 2017-12-01 MED ORDER — SODIUM CHLORIDE 0.9 % IV SOLN: 250.0000 mL | INTRAVENOUS | Status: DC | PRN

## 2017-12-01 SURGICAL SUPPLY — 11 items
BAND ZEPHYR COMPRESS 30 LONG (HEMOSTASIS) ×2 IMPLANT
CATH INFINITI 5 FR JL3.5 (CATHETERS) ×2 IMPLANT
CATH INFINITI 5 FR MPA2 (CATHETERS) ×2 IMPLANT
CATH INFINITI JR4 5F (CATHETERS) ×2 IMPLANT
GLIDESHEATH SLEND A-KIT 6F 22G (SHEATH) ×2 IMPLANT
GUIDEWIRE INQWIRE 1.5J.035X260 (WIRE) ×1 IMPLANT
INQWIRE 1.5J .035X260CM (WIRE) ×2
KIT HEART LEFT (KITS) ×2 IMPLANT
PACK CARDIAC CATHETERIZATION (CUSTOM PROCEDURE TRAY) ×2 IMPLANT
TRANSDUCER W/STOPCOCK (MISCELLANEOUS) ×2 IMPLANT
TUBING CIL FLEX 10 FLL-RA (TUBING) ×2 IMPLANT

## 2017-12-01 NOTE — Progress Notes (Signed)
Patient transported to cath lab for scheduled procedure.  IV team unable to place 2nd IV site this morning.  Cath lab informed.

## 2017-12-01 NOTE — Progress Notes (Addendum)
PROGRESS NOTE    Austin [name]  XBJ:[id number] DOB: 1950/11/14 DOA: 11/26/2017 PCP: Patient, No Pcp Per   Brief Narrative: : 67 y.o.malewho denies any past medical history though notes he does not see a physician, now presenting to the emergency department for evaluation of blackened left great toe with progressive swelling, erythema, and tenderness involving the left foot. Patient reports that he noted a wound at the left great toe in January, more recently developed swelling, redness, and tenderness involving the left forefoot, was seen in the emergency department for this, and discharged home with doxycycline. Despite the oral antibiotic, the patient reports continued progressive swelling, erythema, and tenderness of the left forefoot. He reports some serous drainage from the great toe. He denies chest pain or palpitations. Denies fevers or chills. No known history of diabetes.  ED Course:Upon arrival to the ED, patient is found to be afebrile, saturating well on room air, and hypertensive to 200/90. Radiographs of the left foot demonstrate diffuse soft tissue swelling involving the forefoot and great toe with no evidence for osteomyelitis, and no change since the prior study. Chemistry panel and CBC are unremarkable, and lactic acid is reassuringly normal. Blood cultures were collected, patient was started on IV antibiotics, and orthopedic surgery was consulted by the ED physician. He remains hypertensive, and no apparent respiratory distress, and will be admitted to the medical-surgical unit for ongoing evaluation and management of left great toe gangrene with surrounding cellulitis.  Summary 12/01/2017-patient was admitted on 11/26/2017 with gangrene of the left great toe.  He was started on vancomycin and Zosyn.  Patient has absent left lower extremity pulses.  Bilateral 2+ pitting edema.  Left great toe dry gangrene.  Faint pulses felt in the right lower extremity.  ABI showed Severe  right lower extremity arterial disease, and critical left limb ischemia.  Venous Doppler 3 6 there is no evidence of DVT or superficial thrombosis.  Echocardiogram ejection fraction 65-70%, mild hypokinesis, mild hypokinesis of mid inferior myocardium.  Lexiscan nuclear stress test showed large inferolateral defect concerning for inducible ischemia.  Ejection fraction 51% by nuclear stress test.  Grossly normal wall motion.  Patient scheduled to have cath today 12/01/2017.  CT angiogram showedAortoiliac disease, with distal occlusion of the aorta extending through the bilateral common iliac arteries and proximal external iliac arteries. Reconstitution of the femoral arteries occurs on the right via the inferior epigastric artery and on the left predominantly from lumbar/pelvic collaterals. Aortic Atherosclerosis (ICD10-I70.0).  Moderate left femoropopliteal disease with multifocal short stenoses at the adductor canal, and associated left tibial disease, with occlusions of anterior tibial, posterior tibial, and peroneal arteries beyond the origins.  Moderate right-sided femoropopliteal disease, with no CT evidence of high-grade stenosis. All 3 right tibial arteries appear patent to the ankle.   Patient is being followed by vascular surgery and cardiology.  Assessment & Plan:   Principal Problem:   Gangrene of toe of left foot (HCC) Active Problems:   Hypertension   Cellulitis of foot, left  1.Left great toe gangrene; left foot cellulitis -Presents with increased swelling and pain involving left foot in setting of non-healing chronic left great toe wound -Reports developing left great toe wound 2 months ago, has since developed swelling and tenderness involving the left forefoot, seen in ED and started on doxycycline, but has experienced continued progression despite this -Toe has findings consistent with dry gangrene with surrounding forefoot cellulitis -There is no fever,  leukocytosis, or elevation in lactate on admission -Blood culturesnegative  so far.radiographs neg for underlying osteo. CT angiogram showedAortoiliac disease, with distal occlusion of the aorta extending through the bilateral common iliac arteries and proximal external iliac arteries. Reconstitution of the femoral arteries occurs on the right via the inferior epigastric artery and on the left predominantly from lumbar/pelvic collaterals. Aortic Atherosclerosis (ICD10-I70.0).  Moderate left femoropopliteal disease with multifocal short stenoses at the adductor canal, and associated left tibial disease, with occlusions of anterior tibial, posterior tibial, and peroneal arteries beyond the origins.  Moderate right-sided femoropopliteal disease, with no CT evidence of high-grade stenosis. All 3 right tibial arteries appear patent to the ankle  2.Hypertensive urgency -BP elevated to 200/90 in ED - suspect underlying HTN, but pt does have PCP and denies any medical hx -Treat pain, use hydralazine IVP's prn. BP better controlled after increasing the dose of Zestril.continue metoprolol.  3]history of COPD longtime smoker for 55 years.-stable  4] CAD/PAD-patient to have cardiac cath today.  Continue aspirin, high-dose Lipitor, lisinopril, and metoprolol succinate.  Heart rate 50 last night monitor on beta-blocker.  5] renal-follow-up renal functions daily.  Patient is on vancomycin.  He has had CT scans with contrast.  And now today she is having cardiac cath.    DVT prophylaxis Lovenox Code Status: Full code Family Communication: No family available Disposition Plan: TBD  Consultants: Cardiology, vascular surgery  Procedures: None Antimicrobials: Vanco and Rocephin  Subjective he is on his way down for cath little tearful and nervous.  Otherwise no other new complaints.  Objective: Vitals:   11/30/17 0545 11/30/17 1329 12/01/17 0419 12/01/17 0745  BP: (!) 144/66  (!) 149/74 (!) 152/63   Pulse: (!) 57 70 (!) 50   Resp: 17 18 17    Temp:  98.3 F (36.8 C) 98 F (36.7 C)   TempSrc:  Oral Oral   SpO2: 96% 100% 98% 98%  Weight:        Intake/Output Summary (Last 24 hours) at 12/01/2017 0746 Last data filed at 11/30/2017 2307 Gross per 24 hour  Intake 470 ml  Output 550 ml  Net -80 ml   Filed Weights   11/28/17 1059  Weight: 92.5 kg (204 lb)    Examination:  General exam: Appears calm and comfortable  Respiratory system: Clear to auscultation. Respiratory effort normal. Cardiovascular system: S1 & S2 heard, RRR. No JVD, murmurs, rubs, gallops or clicks. No pedal edema. Gastrointestinal system: Abdomen is nondistended, soft and nontender. No organomegaly or masses felt. Normal bowel sounds heard. Central nervous system: Alert and oriented. No focal neurological deficits. Extremities: EDEMA PLUS.NO PULSE RIGHT .FEEBLE PULSE LEFT. Skin: No rashes, lesions or ulcers Psychiatry: Judgement and insight appear normal. Mood & affect appropriate.     Data Reviewed: I have personally reviewed following labs and imaging studies  CBC: Recent Labs  Lab 11/26/17 1730 11/27/17 0719  WBC 10.4 8.3  NEUTROABS 7.1 5.3  HGB 13.1 12.6*  HCT 39.1 38.6*  MCV 89.9 89.4  PLT 295 266   Basic Metabolic Panel: Recent Labs  Lab 11/27/17 0719 11/28/17 0455 11/29/17 0509 11/30/17 0613 12/01/17 0404  NA 138 136 135 136 139  K 3.8 4.1 4.0 3.7 4.1  CL 108 106 104 106 110  CO2 21* 21* 22 22 23   GLUCOSE 92 93 97 130* 107*  BUN 14 14 14 13 17   CREATININE 0.98 1.03 1.03 1.05 1.03  CALCIUM 8.3* 8.4* 8.5* 8.4* 8.3*   GFR: Estimated Creatinine Clearance: 77.6 mL/min (by C-G formula based on SCr of 1.03  mg/dL). Liver Function Tests: Recent Labs  Lab 11/26/17 1730  AST 37  ALT 36  ALKPHOS 70  BILITOT 0.7  PROT 6.8  ALBUMIN 3.2*   No results for input(s): LIPASE, AMYLASE in the last 168 hours. No results for input(s): AMMONIA in the last 168  hours. Coagulation Profile: No results for input(s): INR, PROTIME in the last 168 hours. Cardiac Enzymes: No results for input(s): CKTOTAL, CKMB, CKMBINDEX, TROPONINI in the last 168 hours. BNP (last 3 results) No results for input(s): PROBNP in the last 8760 hours. HbA1C: No results for input(s): HGBA1C in the last 72 hours. CBG: No results for input(s): GLUCAP in the last 168 hours. Lipid Profile: Recent Labs    11/30/17 0613  CHOL 106  HDL 42  LDLCALC 55  TRIG 43  CHOLHDL 2.5   Thyroid Function Tests: No results for input(s): TSH, T4TOTAL, FREET4, T3FREE, THYROIDAB in the last 72 hours. Anemia Panel: No results for input(s): VITAMINB12, FOLATE, FERRITIN, TIBC, IRON, RETICCTPCT in the last 72 hours. Sepsis Labs: Recent Labs  Lab 11/26/17 1804  LATICACIDVEN 1.71    Recent Results (from the past 240 hour(s))  Blood culture (routine x 2)     Status: None (Preliminary result)   Collection Time: 11/26/17  5:38 PM  Result Value Ref Range Status   Specimen Description BLOOD LEFT ANTECUBITAL  Final   Special Requests   Final    BOTTLES DRAWN AEROBIC AND ANAEROBIC Blood Culture adequate volume   Culture   Final    NO GROWTH 4 DAYS Performed at [facility], [address] [place], [place] [address]    Report Status PENDING  Incomplete  Blood culture (routine x 2)     Status: None (Preliminary result)   Collection Time: 11/26/17  9:29 PM  Result Value Ref Range Status   Specimen Description BLOOD LEFT ANTECUBITAL  Final   Special Requests   Final    BOTTLES DRAWN AEROBIC AND ANAEROBIC Blood Culture adequate volume   Culture   Final    NO GROWTH 4 DAYS Performed at [facility], [address], [place], [place] [address]    Report Status PENDING  Incomplete         Radiology Studies: Nm Myocar Multi W/spect W/wall Motion / Ef  Result Date: 11/29/2017 CLINICAL DATA:  Preoperative evaluation, high risk for surgery EXAM: MYOCARDIAL IMAGING WITH SPECT  (REST AND PHARMACOLOGIC-STRESS) GATED LEFT VENTRICULAR WALL MOTION STUDY LEFT VENTRICULAR EJECTION FRACTION TECHNIQUE: Standard myocardial SPECT imaging was performed after resting intravenous injection of 10 mCi Tc-79m tetrofosmin. Subsequently, intravenous infusion of Lexiscan was performed under the supervision of the Cardiology staff. At peak effect of the drug, 30 mCi Tc-83m tetrofosmin was injected intravenously and standard myocardial SPECT imaging was performed. Quantitative gated imaging was also performed to evaluate left ventricular wall motion, and estimate left ventricular ejection fraction. COMPARISON:  None. FINDINGS: Perfusion: Inferolateral defect on both rest and stress imaging however the defect is larger on stress imaging concerning for inferolateral peri-infarct inducible ischemia with pharmacologic stress. Wall Motion: Grossly normal wall motion.  No LV chamber dilatation. Left Ventricular Ejection Fraction: 51 % End diastolic volume 125 ml End systolic volume 61 ml IMPRESSION: 1. Larger inferolateral defect on stress imaging concerning for inferolateral peri-infarct inducible ischemia with pharmacologic stress. 2. Grossly normal wall motion. 3. Left ventricular ejection fraction 51% 4. Non invasive risk stratification*: Intermediate *2012 Appropriate Use Criteria for Coronary Revascularization Focused Update: J Am Coll Cardiol. 2012;59(9):857-881.  http://content.dementiazones.com.aspx?articleid=[email or web address] Electronically Signed   By: Judie Petit.  Shick M.D.   On: 11/29/2017 15:24        Scheduled Meds: . [MAR Hold] aspirin  81 mg Oral Daily  . [MAR Hold] atorvastatin  80 mg Oral q1800  . [MAR Hold] enoxaparin (LOVENOX) injection  40 mg Subcutaneous Daily  . [MAR Hold] lisinopril  10 mg Oral Daily  . [MAR Hold] metoprolol succinate  50 mg Oral Daily  . sodium chloride flush  3 mL Intravenous Q12H   Continuous Infusions: . sodium chloride    . sodium chloride 75 mL/hr at 12/01/17 0646   . [MAR Hold] cefTRIAXone (ROCEPHIN)  IV Stopped (11/30/17 1127)  . [MAR Hold] vancomycin Stopped (12/01/17 0048)     LOS: 5 days    Alwyn Ren, MD [facility]  If 7PM-7AM, please contact night-coverage [email or web address] Password [facility] 12/01/2017, 7:46 AM

## 2017-12-01 NOTE — Progress Notes (Signed)
Subjective:  No chest pain Minimal pain at cath site  Objective:  Vital Signs in the last 24 hours: Temp:  [98 F (36.7 C)-98.3 F (36.8 C)] 98.2 F (36.8 C) (03/18 1043) Pulse Rate:  [50-92] 71 (03/18 1043) Resp:  [4-57] 18 (03/18 1043) BP: (149-235)/(63-130) 163/85 (03/18 1043) SpO2:  [95 %-100 %] 98 % (03/18 1043)  Intake/Output from previous day: 03/17 0701 - 03/18 0700 In: 470 [P.O.:120; IV Piggyback:350] Out: 550 [Urine:550] Intake/Output from this shift: Total I/O In: -  Out: 200 [Urine:200]  Physical Exam: Nursing note and vitals reviewed. Constitutional: He is oriented to person, place, and time. He appears well-developed and well-nourished.  HENT:  Head: Normocephalic.  Eyes: Conjunctivae are normal. Pupils are equal, round, and reactive to light.  Neck: Normal range of motion. Neck supple. No JVD present.  Cardiovascular: Normal rate, regular rhythm and normal heart sounds.  No murmur heard. Absent left lower extremity pulses. Feeble pulses RTLE. Bilateral 2+ pitting edema. Left great toe  dry gangrene. No carotid bruit.  Right radial site with no bleeding, hematoma. Good capillary refill.  Respiratory: Effort normal and breath sounds normal. He has no wheezes. He has no rales.  Musculoskeletal: He exhibits edema (2+).  Lymphadenopathy:    He has no cervical adenopathy.  Neurological: He is alert and oriented to person, place, and time. No cranial nerve deficit.  Skin: Skin is warm and dry.  Psychiatric: He has a normal mood and affect.      Lab Results: No results for input(s): WBC, HGB, PLT in the last 72 hours. Recent Labs    11/30/17 0613 12/01/17 0404  NA 136 139  K 3.7 4.1  CL 106 110  CO2 22 23  GLUCOSE 130* 107*  BUN 13 17  CREATININE 1.05 1.03   No results for input(s): TROPONINI in the last 72 hours.  Invalid input(s): CK, MB Hepatic Function Panel No results for input(s): PROT, ALBUMIN, AST, ALT, ALKPHOS, BILITOT, BILIDIR, IBILI  in the last 72 hours. Recent Labs    11/30/17 0613  CHOL 106   No results for input(s): PROTIME in the last 72 hours.  Imaging: Imaging results have been reviewed  B/l ABI 11/28/2017: Final Interpretation: Right: Resting right ankle-brachial index indicates severe right lower extremity arterial disease. Left: Resting left ankle-brachial index indicates critical left limb ischemia. No audible Doppler signal on the left side. Cannot exclude extremely low flow.  Venous duplex ultrasound 11/19/2017: Final Interpretation: Right: No evidence of common femoral vein obstruction. Left: Ultrasound characteristics of enlarged lymph node measuring 1 cm high by 3.8 cm wide by 3.9 cm long noted in the groin. There is no evidence of deep vein thrombosis in the lower extremity.There is no evidence of superficial venous thrombosis. No  cystic structure found in the popliteal fossa.  CTA AO + Bifem 11/28/2017: Final Interpretation: Right: No evidence of common femoral vein obstruction. Left: Ultrasound characteristics of enlarged lymph node measuring 1 cm high by 3.8 cm wide by 3.9 cm long noted in the groin. There is no evidence of deep vein thrombosis in the lower extremity.There is no evidence of superficial venous thrombosis. No  cystic structure found in the popliteal fossa.   Cardiac Studies: EKG 11/29/2017 14:17 Sinus rhythm 69 bpm. Left axis deviation. Left ventricular hypertrophy. Possible old anteroseptal infarct. Lateral T-wave inversions could be due to LVH. Cannot exclude ischemia.  Echocardiogram 11/29/2017: Study Conclusions  - Left ventricle: The cavity size was normal. There was moderate  concentric hypertrophy. The estimated ejection fraction was in   the range of 65% to 70%. Mild hypokinesis. Mild hypokinesis of   the midinferior myocardium. Features are consistent with grade II   diastolic dysfunction, elevated LAP. - Left atrium: The atrium was mildly dilated. -  Pulmonary arteries: PA peak pressure: 40 mm Hg (S).  Lexiscan nuclear stress test 11/29/2017: IMPRESSION: 1. Larger inferolateral defect on stress imaging concerning for inferolateral peri-infarct inducible ischemia with pharmacologic stress.  2. Grossly normal wall motion.  3. Left ventricular ejection fraction 51%  4. Non invasive risk stratification*: Intermediate   Assessment: 67 year old African-American male  Left lower extremity critical limb ischemia, left great toe dry gangrene Severe bilateral lower extremity peripheral artery disease, distal abdominal aorta occlusion Uncontrolled hypertension Tobacco abuse Abnormal stress test: Large inferior peri infarct ischemia, intermediate risk study. Likely due to endocardial ischemia in the setting of uncontrolled blood pressure Angiographically normal coronary arteries with no significant CAD  Recommendations: Continue aspirin 81 mg, Lipitor 80 mg, metoprolol succinate 50 mg daily.  Added amlodipine 10 mg for improved blood pressure control.  Acceptable cardiac risk for noncardiac vascular surgery aortobifemoral bypass.   Recommendations as per 2014 ACC/AHA perioerative cardiac risk stratification guidelines.  Signing off. Please call us back with any questions.   LOS: 5 days    Austin [name] 12/01/2017, 11:08 AM  Austin Ahmed Emiliano DyerJ Jameela Michna, MD [facility] Pager: [phone] Office: [phone] If no answer Cell [phone]

## 2017-12-01 NOTE — Progress Notes (Signed)
Subjective  -   No clinical change Cardiac cath today   Physical Exam:  Dressing covering left great toe Abdomen is soft and nontender Respirations nonlabored   Carotid duplex shows 40-59% bilateral stenosis    Assessment/Plan:    Left great toe wound: I had a lengthy discussion with the patient regarding her options.  I feel that without revascularization he is at risk for at minimum a toe amputation but most likely a more proximal amputation we discussed proceeding with an aortobifemoral bypass graft.  I discussed the risks and benefits as well as the details of the procedure.  We discussed the morbidity mortality of the operation including the risk of death, stroke, intestinal injury, sexual dysfunction, and lower extremity ischemia.  We also discussed the risk of hernia, blood loss, cardiopulmonary complications.  I diagrammed out the operation and described in detail.  All of his questions were answered.  I plan on proceeding with an aortobifemoral bypass graft on Wednesday.  He will need to be n.p.o. after midnight on Tuesday, March 19.  He will need a type and cross for 2 units of blood.  Wells Mardy Hoppe 12/01/2017 7:52 PM --  Vitals:   12/01/17 1043 12/01/17 1800  BP: (!) 163/85 (!) 150/52  Pulse: 71 75  Resp: 18 18  Temp: 98.2 F (36.8 C) 98.3 F (36.8 C)  SpO2: 98% 100%    Intake/Output Summary (Last 24 hours) at 12/01/2017 1952 Last data filed at 12/01/2017 1900 Gross per 24 hour  Intake 1530 ml  Output 525 ml  Net 1005 ml     Laboratory CBC    Component Value Date/Time   WBC 8.3 11/27/2017 0719   HGB 12.6 (L) 11/27/2017 0719   HCT 38.6 (L) 11/27/2017 0719   PLT 266 11/27/2017 0719    BMET    Component Value Date/Time   NA 139 12/01/2017 0404   K 4.1 12/01/2017 0404   CL 110 12/01/2017 0404   CO2 23 12/01/2017 0404   GLUCOSE 107 (H) 12/01/2017 0404   BUN 17 12/01/2017 0404   CREATININE 1.03 12/01/2017 0404   CALCIUM 8.3 (L) 12/01/2017 0404    GFRNONAA >60 12/01/2017 0404   GFRAA >60 12/01/2017 0404    COAG Lab Results  Component Value Date   INR 1.03 12/01/2017   No results found for: PTT  Antibiotics Anti-infectives (From admission, onward)   Start     Dose/Rate Route Frequency Ordered Stop   11/29/17 2330  vancomycin (VANCOCIN) 1,250 mg in sodium chloride 0.9 % 250 mL IVPB     1,250 mg 166.7 mL/hr over 90 Minutes Intravenous Every 12 hours 11/29/17 1419     11/27/17 1130  vancomycin (VANCOCIN) IVPB 1000 mg/200 mL premix  Status:  Discontinued     1,000 mg 200 mL/hr over 60 Minutes Intravenous Every 12 hours 11/26/17 2239 11/29/17 1419   11/27/17 1030  cefTRIAXone (ROCEPHIN) 2 g in sodium chloride 0.9 % 100 mL IVPB     2 g 200 mL/hr over 30 Minutes Intravenous Every 24 hours 11/27/17 0935     11/27/17 0930  cefTRIAXone (ROCEPHIN) injection 2 g  Status:  Discontinued     2 g Intramuscular Every 24 hours 11/27/17 0929 11/27/17 0933   11/26/17 2300  vancomycin (VANCOCIN) 2,000 mg in sodium chloride 0.9 % 500 mL IVPB     2,000 mg 250 mL/hr over 120 Minutes Intravenous  Once 11/26/17 2239 11/27/17 0411   11/26/17 2130  vancomycin (VANCOCIN)  2,000 mg in sodium chloride 0.9 % 500 mL IVPB  Status:  Discontinued     2,000 mg 250 mL/hr over 120 Minutes Intravenous  Once 11/26/17 2108 11/26/17 2128   11/26/17 2115  piperacillin-tazobactam (ZOSYN) IVPB 3.375 g  Status:  Discontinued     3.375 g 100 mL/hr over 30 Minutes Intravenous  Once 11/26/17 2108 11/26/17 2128       V. Charlena CrossWells Jarod Bozzo IV, M.D. [facility] [place] Office: [phone] Pager:  [phone]

## 2017-12-01 NOTE — Progress Notes (Signed)
[facility] BAND REMOVAL  LOCATION: Right radial  DEFLATED PER PROTOCOL:    Yes.    TIME BAND OFF / DRESSING APPLIED:    1020am  Gauze with Tegaderm and secured with Coban  SITE UPON ARRIVAL:    Level 0  SITE AFTER BAND REMOVAL:    Level 0  CIRCULATION SENSATION AND MOVEMENT:    Within Normal Limits   Yes.    COMMENTS:   BP meds given

## 2017-12-01 NOTE — H&P (View-Only) (Signed)
Subjective  -   No clinical change Cardiac cath today   Physical Exam:  Dressing covering left great toe Abdomen is soft and nontender Respirations nonlabored   Carotid duplex shows 40-59% bilateral stenosis    Assessment/Plan:    Left great toe wound: I had a lengthy discussion with the patient regarding her options.  I feel that without revascularization he is at risk for at minimum a toe amputation but most likely a more proximal amputation we discussed proceeding with an aortobifemoral bypass graft.  I discussed the risks and benefits as well as the details of the procedure.  We discussed the morbidity mortality of the operation including the risk of death, stroke, intestinal injury, sexual dysfunction, and lower extremity ischemia.  We also discussed the risk of hernia, blood loss, cardiopulmonary complications.  I diagrammed out the operation and described in detail.  All of his questions were answered.  I plan on proceeding with an aortobifemoral bypass graft on Wednesday.  He will need to be n.p.o. after midnight on Tuesday, March 19.  He will need a type and cross for 2 units of blood.  Wells Brabham 12/01/2017 7:52 PM --  Vitals:   12/01/17 1043 12/01/17 1800  BP: (!) 163/85 (!) 150/52  Pulse: 71 75  Resp: 18 18  Temp: 98.2 F (36.8 C) 98.3 F (36.8 C)  SpO2: 98% 100%    Intake/Output Summary (Last 24 hours) at 12/01/2017 1952 Last data filed at 12/01/2017 1900 Gross per 24 hour  Intake 1530 ml  Output 525 ml  Net 1005 ml     Laboratory CBC    Component Value Date/Time   WBC 8.3 11/27/2017 0719   HGB 12.6 (L) 11/27/2017 0719   HCT 38.6 (L) 11/27/2017 0719   PLT 266 11/27/2017 0719    BMET    Component Value Date/Time   NA 139 12/01/2017 0404   K 4.1 12/01/2017 0404   CL 110 12/01/2017 0404   CO2 23 12/01/2017 0404   GLUCOSE 107 (H) 12/01/2017 0404   BUN 17 12/01/2017 0404   CREATININE 1.03 12/01/2017 0404   CALCIUM 8.3 (L) 12/01/2017 0404    GFRNONAA >60 12/01/2017 0404   GFRAA >60 12/01/2017 0404    COAG Lab Results  Component Value Date   INR 1.03 12/01/2017   No results found for: PTT  Antibiotics Anti-infectives (From admission, onward)   Start     Dose/Rate Route Frequency Ordered Stop   11/29/17 2330  vancomycin (VANCOCIN) 1,250 mg in sodium chloride 0.9 % 250 mL IVPB     1,250 mg 166.7 mL/hr over 90 Minutes Intravenous Every 12 hours 11/29/17 1419     11/27/17 1130  vancomycin (VANCOCIN) IVPB 1000 mg/200 mL premix  Status:  Discontinued     1,000 mg 200 mL/hr over 60 Minutes Intravenous Every 12 hours 11/26/17 2239 11/29/17 1419   11/27/17 1030  cefTRIAXone (ROCEPHIN) 2 g in sodium chloride 0.9 % 100 mL IVPB     2 g 200 mL/hr over 30 Minutes Intravenous Every 24 hours 11/27/17 0935     11/27/17 0930  cefTRIAXone (ROCEPHIN) injection 2 g  Status:  Discontinued     2 g Intramuscular Every 24 hours 11/27/17 0929 11/27/17 0933   11/26/17 2300  vancomycin (VANCOCIN) 2,000 mg in sodium chloride 0.9 % 500 mL IVPB     2,000 mg 250 mL/hr over 120 Minutes Intravenous  Once 11/26/17 2239 11/27/17 0411   11/26/17 2130  vancomycin (VANCOCIN)  2,000 mg in sodium chloride 0.9 % 500 mL IVPB  Status:  Discontinued     2,000 mg 250 mL/hr over 120 Minutes Intravenous  Once 11/26/17 2108 11/26/17 2128   11/26/17 2115  piperacillin-tazobactam (ZOSYN) IVPB 3.375 g  Status:  Discontinued     3.375 g 100 mL/hr over 30 Minutes Intravenous  Once 11/26/17 2108 11/26/17 2128       V. Charlena CrossWells Brabham IV, M.D. [facility] [place] Office: [phone] Pager:  [phone]

## 2017-12-01 NOTE — Progress Notes (Signed)
Patient returned to 6n22 A&Ox4, VSS, and IV intact and infusing in RFA.  Noted to have right radial bandage and compression wrap.  Right radial pulse strong and regular.  No family at bedside.  Will continue to monitor.

## 2017-12-01 NOTE — Interval H&P Note (Signed)
History and Physical Interval Note:  12/01/2017 7:32 AM  Austin DurandStephen [name]  has presented today for surgery, with the diagnosis of unstable angina  The various methods of treatment have been discussed with the patient and family. After consideration of risks, benefits and other options for treatment, the patient has consented to  Procedure(s): LEFT HEART CATH AND CORONARY ANGIOGRAPHY (N/A) as a surgical intervention .  The patient's history has been reviewed, patient examined, no change in status, stable for surgery.  I have reviewed the patient's chart and labs.  Questions were answered to the patient's satisfaction.    2016/2017 Appropriate Use Criteria for Coronary Revascularization Clinical Presentation: Diabetes Mellitus? Symptom Status? S/P CABG? Antianginal Therapy (# of long-acting drugs)? Results of Non-invasive testing? FFR/iFR results in all diseased vessels? Patient undergoing renal transplant? Patient undergoing percutaneous valve procedure (TAVR, MitraClip, Others)? Symptom Status:  Ischemic Symptoms  Non-invasive Testing:  Intermediate Risk  If no or indeterminate stress test, FFR/iFR results in all diseased vessels:  N/A  Diabetes Mellitus:  No  S/P CABG:  No  Antianginal therapy (number of long-acting drugs):  1  Patient undergoing renal transplant:  No  Patient undergoing percutaneous valve procedure:  No    newline 1 Vessel Disease PCI CABG  No proximal LAD involvement, No proximal left dominant LCX involvement M (6); Indication 2 M (4); Indication 2   Proximal left dominant LCX involvement A (7); Indication 5 A (7); Indication 5   Proximal LAD involvement A (7); Indication 5 A (7); Indication 5   newline 2 Vessel Disease  No proximal LAD involvement A (7); Indication 8 M (6); Indication 8   Proximal LAD involvement A (7); Indication 11 A (7); Indication 11   newline 3 Vessel Disease  Low disease complexity (e.g., focal stenoses, SYNTAX <=22) A (7); Indication 17 A  (8); Indication 17   Intermediate or high disease complexity (e.g., SYNTAX >=23) M (6); Indication 21 A (8); Indication 21   newline Left Main Disease  Isolated LMCA disease: ostial or midshaft A (7); Indication 24 A (9); Indication 24   Isolated LMCA disease: bifurcation involvement M (5); Indication 25 A (9); Indication 25   LMCA ostial or midshaft, concurrent low disease burden multivessel disease (e.g., 1-2 additional focal stenoses, SYNTAX <=22) A (7); Indication 26 A (9); Indication 26   LMCA ostial or midshaft, concurrent intermediate or high disease burden multivessel disease (e.g., 1-2 additional bifurcation stenoses, long stenoses, SYNTAX >=23) M (4); Indication 27 A (9); Indication 27   LMCA bifurcation involvement, concurrent low disease burden multivessel disease (e.g., 1-2 additional focal stenoses, SYNTAX <=22) M (5); Indication 28 A (9); Indication 28   LMCA bifurcation involvement, concurrent intermediate or high disease burden multivessel disease (e.g., 1-2 additional bifurcation stenoses, long stenoses, SYNTAX >=23) R (3); Indication 29 A (9); Indication 29         Austin [name] Austin [name]

## 2017-12-02 LAB — CBC WITH DIFFERENTIAL/PLATELET
Basophils Absolute: 0.1 10*3/uL (ref 0.0–0.1)
Basophils Relative: 1 %
EOS PCT: 3 %
Eosinophils Absolute: 0.2 10*3/uL (ref 0.0–0.7)
HCT: 36 % — ABNORMAL LOW (ref 39.0–52.0)
Hemoglobin: 11.7 g/dL — ABNORMAL LOW (ref 13.0–17.0)
LYMPHS ABS: 1.5 10*3/uL (ref 0.7–4.0)
LYMPHS PCT: 20 %
MCH: 29.6 pg (ref 26.0–34.0)
MCHC: 32.5 g/dL (ref 30.0–36.0)
MCV: 91.1 fL (ref 78.0–100.0)
MONO ABS: 0.8 10*3/uL (ref 0.1–1.0)
Monocytes Relative: 10 %
Neutro Abs: 5.1 10*3/uL (ref 1.7–7.7)
Neutrophils Relative %: 66 %
PLATELETS: 290 10*3/uL (ref 150–400)
RBC: 3.95 MIL/uL — AB (ref 4.22–5.81)
RDW: 14.4 % (ref 11.5–15.5)
WBC: 7.7 10*3/uL (ref 4.0–10.5)

## 2017-12-02 LAB — BASIC METABOLIC PANEL
Anion gap: 9 (ref 5–15)
BUN: 13 mg/dL (ref 6–20)
CHLORIDE: 107 mmol/L (ref 101–111)
CO2: 22 mmol/L (ref 22–32)
Calcium: 8.6 mg/dL — ABNORMAL LOW (ref 8.9–10.3)
Creatinine, Ser: 0.95 mg/dL (ref 0.61–1.24)
GFR calc Af Amer: >60 mL/min (ref >60)
GFR calc non Af Amer: >60 mL/min (ref >60)
GLUCOSE: 115 mg/dL — AB (ref 65–99)
POTASSIUM: 3.9 mmol/L (ref 3.5–5.1)
Sodium: 138 mmol/L (ref 135–145)

## 2017-12-02 LAB — SURGICAL PCR SCREEN
MRSA, PCR: POSITIVE — AB
Staphylococcus aureus: POSITIVE — AB

## 2017-12-02 LAB — PREPARE RBC (CROSSMATCH)

## 2017-12-02 LAB — ABO/RH: ABO/RH(D): A POS

## 2017-12-02 MED ORDER — MUPIROCIN 2 % EX OINT
1.0000 | TOPICAL_OINTMENT | Freq: Two times a day (BID) | CUTANEOUS | Status: DC
  Administered 2017-12-02 (×2): 1 via NASAL
  Filled 2017-12-02: qty 22

## 2017-12-02 MED ORDER — MUPIROCIN 2 % EX OINT: 1.0000 "application " | TOPICAL_OINTMENT | Freq: Two times a day (BID) | CUTANEOUS | Status: DC

## 2017-12-02 MED ORDER — CHLORHEXIDINE GLUCONATE CLOTH 2 % EX PADS
6.0000 | MEDICATED_PAD | Freq: Every day | CUTANEOUS | Status: DC
  Administered 2017-12-03: 6 via TOPICAL

## 2017-12-02 MED FILL — Heparin Sodium (Porcine) 2 Unit/ML in Sodium Chloride 0.9%: INTRAMUSCULAR | Qty: 1000 | Status: AC

## 2017-12-02 NOTE — Care Management Important Message (Signed)
Important Message  Patient Details  Name: Austin DurandStephen [name] MRN: [id number] Date of Birth: 05/21/1951   Medicare Important Message Given:  Yes  Correction patient did not sign Contact precaution in place  Aqib Lough 12/02/2017, 4:12 PM

## 2017-12-02 NOTE — Progress Notes (Signed)
Pharmacy Antibiotic Note  Austin [name] is a 67 y.o. male admitted on 11/26/2017 with cellulitis. Pharmacy dosing vancomycin.  SCr is stable at 0.95 with normalized CrCl~80 ml/min.  Patient remains afebrile and WBC is trending down.  Cultures are no growth x2 days.  Plan: Continue vancomycin to 1250 mg IV every 12 hours.  Follow renal function, cultures and clinical progress  Weight: 204 lb (92.5 kg)(Patient reported)  Temp (24hrs), Avg:98.5 F (36.9 C), Min:98.2 F (36.8 C), Max:99.3 F (37.4 C)  Recent Labs  Lab 11/26/17 1730 11/26/17 1804 11/27/17 0719 11/28/17 0455 11/29/17 0509 11/29/17 1221 11/30/17 0613 12/01/17 0404 12/02/17 0711  WBC 10.4  --  8.3  --   --   --   --   --  7.7  CREATININE 1.14  --  0.98 1.03 1.03  --  1.05 1.03 0.95  LATICACIDVEN  --  1.71  --   --   --   --   --   --   --   VANCOTROUGH  --   --   --   --   --  8*  --   --   --     Estimated Creatinine Clearance: 84.1 mL/min (by C-G formula based on SCr of 0.95 mg/dL).    No Known Allergies   Thank you for allowing pharmacy to be a part of this patient's care.  Vinnie LevelBenjamin Briannia Laba, PharmD., BCPS Clinical Pharmacist Clinical phone for 12/02/17 until 3:30pm: [phone] If after 3:30pm, please call main pharmacy at: [phone]

## 2017-12-02 NOTE — Progress Notes (Signed)
PROGRESS NOTE    Austin [name]  GNF:[id number] DOB: October 31, 1950 DOA: 11/26/2017 PCP: Patient, No Pcp Per   Brief Narrative:67 y.o.malewho denies any past medical history though notes he does not see a physician, now presenting to the emergency department for evaluation of blackened left great toe with progressive swelling, erythema, and tenderness involving the left foot. Patient reports that he noted a wound at the left great toe in January, more recently developed swelling, redness, and tenderness involving the left forefoot, was seen in the emergency department for this, and discharged home with doxycycline. Despite the oral antibiotic, the patient reports continued progressive swelling, erythema, and tenderness of the left forefoot. He reports some serous drainage from the great toe. He denies chest pain or palpitations. Denies fevers or chills. No known history of diabetes.  ED Course:Upon arrival to the ED, patient is found to be afebrile, saturating well on room air, and hypertensive to 200/90. Radiographs of the left foot demonstrate diffuse soft tissue swelling involving the forefoot and great toe with no evidence for osteomyelitis, and no change since the prior study. Chemistry panel and CBC are unremarkable, and lactic acid is reassuringly normal. Blood cultures were collected, patient was started on IV antibiotics, and orthopedic surgery was consulted by the ED physician. He remains hypertensive, and no apparent respiratory distress, and will be admitted to the medical-surgical unit for ongoing evaluation and management of left great toe gangrene with surrounding cellulitis.  Summary 12/01/2017-patient was admitted on 11/26/2017 with gangrene of the left great toe.  He was started on vancomycin and Zosyn.  Patient has absent left lower extremity pulses.  Bilateral 2+ pitting edema.  Left great toe dry gangrene.  Faint pulses felt in the right lower extremity.  ABI showed Severe  right lower extremity arterial disease, and critical left limb ischemia.  Venous Doppler 3 6 there is no evidence of DVT or superficial thrombosis.  Echocardiogram ejection fraction 65-70%, mild hypokinesis, mild hypokinesis of mid inferior myocardium.  Lexiscan nuclear stress test showed large inferolateral defect concerning for inducible ischemia.  Ejection fraction 51% by nuclear stress test.  Grossly normal wall motion.  Patient scheduled to have cath today 12/01/2017.  CT angiogram showedAortoiliac disease, with distal occlusion of the aorta extending through the bilateral common iliac arteries and proximal external iliac arteries. Reconstitution of the femoral arteries occurs on the right via the inferior epigastric artery and on the left predominantly from lumbar/pelvic collaterals. Aortic Atherosclerosis  Moderate left femoropopliteal disease with multifocal short stenoses at the adductor canal, and associated left tibial disease, with occlusions of anterior tibial, posterior tibial, and peroneal arteries beyond the origins.  Moderate right-sided femoropopliteal disease, with no CT evidence of high-grade stenosis. All 3 right tibial arteries appear patent to the ankle.   Patient is being followed by vascular surgery and cardiology.  12/02/2017 patient had cardiac cath yesterday which was pleasantly surprised to see there was no evidence of blockage.  Patient to proceed with aortofemoral bypass tomorrow 12/03/2017.  Patient feels he is ready to get the procedure done and go home.    Assessment & Plan:   Principal Problem:   Gangrene of toe of left foot (HCC) Active Problems:   Hypertension   Cellulitis of foot, left  1.Left great toe gangrene; left foot cellulitis-patient has been on vancomycin and Rocephin since admission 11/26/2017.  He has had no fever no high white count cultures have been negative so far.  Will DC the antibiotics if okay with vascular surgery.  The gangrene is  thought to be more vascular in nature than infectious.  And it is a dry gangrene. -Presents with increased swelling and pain involving left foot in setting of non-healing chronic left great toe wound -Reports developing left great toe wound 2 months ago, has since developed swelling and tenderness involving the left forefoot, seen in ED and started on doxycycline, but has experienced continued progression despite this -Toe has findings consistent with dry gangrene with surrounding forefoot cellulitis -There is no fever, leukocytosis, or elevation in lactate on admission -Blood culturesnegative so far.radiographs neg for underlying osteo. CT angiogram showedAortoiliac disease, with distal occlusion of the aorta extending through the bilateral common iliac arteries and proximal external iliac arteries. Reconstitution of the femoral arteries occurs on the right via the inferior epigastric artery and on the left predominantly from lumbar/pelvic collaterals. Aortic Atherosclerosis (ICD10-I70.0).  Moderate left femoropopliteal disease with multifocal short stenoses at the adductor canal, and associated left tibial disease, with occlusions of anterior tibial, posterior tibial, and peroneal arteries beyond the origins.  Moderate right-sided femoropopliteal disease, with no CT evidence of high-grade stenosis. All 3 right tibial arteries appear patent to the ankle.  Patient had cardiac catheter on 12/01/2017 .Angiographically normal coronary arteries with no significant epicardial coronary artery disease Elevated LVEDP Hypertensive heart disease  2.Hypertensive urgency -BP elevated to 200/90 in ED - suspect underlying HTN, but pt does have PCP and denies any medical hx -Treat pain, use hydralazine IVP's prn. BP better controlled after increasing the dose of Zestril.continue metoprolol.  3]history of COPD longtime smoker for 55 years.-stable  4] CAD/PAD-patient to have  cardiac cath today.  Continue aspirin, high-dose Lipitor, lisinopril, and metoprolol succinate.  Heart rate 50 last night monitor on beta-blocker.  5] renal-renal function stable    DVT prophylaxis: Lovenox Code Status: Full code Family Communication: No family available Disposition Plan: Patient waiting for aortofemoral bypass procedure 12/03/2017. Consultants:  Cardiology, vascular surgery Procedures: Cardiac cath Antimicrobials: Vanco and Rocephin Subjective: Feels well no new complaints  Objective: Vitals:   12/01/17 1043 12/01/17 1800 12/01/17 2100 12/02/17 0522  BP: (!) 163/85 (!) 150/52 (!) 141/64 (!) 147/65  Pulse: 71 75 66 67  Resp: 18 18 18 18   Temp: 98.2 F (36.8 C) 98.3 F (36.8 C) 99.3 F (37.4 C) 98.2 F (36.8 C)  TempSrc: Oral Oral Oral Oral  SpO2: 98% 100% 100% 100%  Weight:        Intake/Output Summary (Last 24 hours) at 12/02/2017 0817 Last data filed at 12/02/2017 0523 Gross per 24 hour  Intake 1160 ml  Output 1325 ml  Net -165 ml   Filed Weights   11/28/17 1059  Weight: 92.5 kg (204 lb)    Examination:  General exam: Appears calm and comfortable  Respiratory system: Clear to auscultation. Respiratory effort normal. Cardiovascular system: S1 & S2 heard, RRR. No JVD, murmurs, rubs, gallops or clicks. No pedal edema. Gastrointestinal system: Abdomen is nondistended, soft and nontender. No organomegaly or masses felt. Normal bowel sounds heard. Central nervous system: Alert and oriented. No focal neurological deficits. Extremities: Symmetric 5 x 5 power. Skin: No rashes, lesions or ulcers Psychiatry: Judgement and insight appear normal. Mood & affect appropriate.     Data Reviewed: I have personally reviewed following labs and imaging studies  CBC: Recent Labs  Lab 11/26/17 1730 11/27/17 0719 12/02/17 0711  WBC 10.4 8.3 7.7  NEUTROABS 7.1 5.3 5.1  HGB 13.1 12.6* 11.7*  HCT 39.1 38.6* 36.0*  MCV 89.9  89.4 91.1  PLT 295 266 290    Basic Metabolic Panel: Recent Labs  Lab 11/27/17 0719 11/28/17 0455 11/29/17 0509 11/30/17 0613 12/01/17 0404  NA 138 136 135 136 139  K 3.8 4.1 4.0 3.7 4.1  CL 108 106 104 106 110  CO2 21* 21* 22 22 23   GLUCOSE 92 93 97 130* 107*  BUN 14 14 14 13 17   CREATININE 0.98 1.03 1.03 1.05 1.03  CALCIUM 8.3* 8.4* 8.5* 8.4* 8.3*   GFR: Estimated Creatinine Clearance: 77.6 mL/min (by C-G formula based on SCr of 1.03 mg/dL). Liver Function Tests: Recent Labs  Lab 11/26/17 1730  AST 37  ALT 36  ALKPHOS 70  BILITOT 0.7  PROT 6.8  ALBUMIN 3.2*   No results for input(s): LIPASE, AMYLASE in the last 168 hours. No results for input(s): AMMONIA in the last 168 hours. Coagulation Profile: Recent Labs  Lab 12/01/17 1118  INR 1.03   Cardiac Enzymes: No results for input(s): CKTOTAL, CKMB, CKMBINDEX, TROPONINI in the last 168 hours. BNP (last 3 results) No results for input(s): PROBNP in the last 8760 hours. HbA1C: No results for input(s): HGBA1C in the last 72 hours. CBG: No results for input(s): GLUCAP in the last 168 hours. Lipid Profile: Recent Labs    11/30/17 0613  CHOL 106  HDL 42  LDLCALC 55  TRIG 43  CHOLHDL 2.5   Thyroid Function Tests: No results for input(s): TSH, T4TOTAL, FREET4, T3FREE, THYROIDAB in the last 72 hours. Anemia Panel: No results for input(s): VITAMINB12, FOLATE, FERRITIN, TIBC, IRON, RETICCTPCT in the last 72 hours. Sepsis Labs: Recent Labs  Lab 11/26/17 1804  LATICACIDVEN 1.71    Recent Results (from the past 240 hour(s))  Blood culture (routine x 2)     Status: None   Collection Time: 11/26/17  5:38 PM  Result Value Ref Range Status   Specimen Description BLOOD LEFT ANTECUBITAL  Final   Special Requests   Final    BOTTLES DRAWN AEROBIC AND ANAEROBIC Blood Culture adequate volume   Culture   Final    NO GROWTH 5 DAYS Performed at [facility], [address] [place], [place] [address]    Report Status 12/01/2017 FINAL   Final  Blood culture (routine x 2)     Status: None   Collection Time: 11/26/17  9:29 PM  Result Value Ref Range Status   Specimen Description BLOOD LEFT ANTECUBITAL  Final   Special Requests   Final    BOTTLES DRAWN AEROBIC AND ANAEROBIC Blood Culture adequate volume   Culture   Final    NO GROWTH 5 DAYS Performed at [facility], [address], [place], [place] [address]    Report Status 12/01/2017 FINAL  Final         Radiology Studies: No results found.      Scheduled Meds: . amLODipine  10 mg Oral Daily  . aspirin  81 mg Oral Daily  . atorvastatin  80 mg Oral q1800  . enoxaparin (LOVENOX) injection  40 mg Subcutaneous Daily  . lisinopril  10 mg Oral Daily  . metoprolol succinate  50 mg Oral Daily  . sodium chloride flush  3 mL Intravenous Q12H   Continuous Infusions: . sodium chloride    . cefTRIAXone (ROCEPHIN)  IV Stopped (11/30/17 1127)  . vancomycin Stopped (12/02/17 0022)     LOS: 6 days      Alwyn Ren, MD [facility] If 7PM-7AM, please contact  night-coverage [email or web address] Password [email or web address] 12/02/2017, 8:17 AM

## 2017-12-02 NOTE — Progress Notes (Signed)
Patient has swelling both lower extremeties, educated about elevating legs while in recliner. Pt has refused and sits with feet on ground.

## 2017-12-02 NOTE — Anesthesia Preprocedure Evaluation (Addendum)
Anesthesia Evaluation  Patient identified by MRN, date of birth, ID band Patient awake    Reviewed: Allergy & Precautions, H&P , NPO status , Patient's Chart, lab work & pertinent test results  Airway Mallampati: II  TM Distance: >3 FB Neck ROM: Full    Dental no notable dental hx. (+) Edentulous Upper, Edentulous Lower, Dental Advisory Given   Pulmonary Current Smoker,    Pulmonary exam normal breath sounds clear to auscultation       Cardiovascular Exercise Tolerance: Good hypertension, + Peripheral Vascular Disease  negative cardio ROS   Rhythm:Regular Rate:Normal     Neuro/Psych negative neurological ROS  negative psych ROS   GI/Hepatic negative GI ROS, Neg liver ROS,   Endo/Other  negative endocrine ROS  Renal/GU negative Renal ROS  negative genitourinary   Musculoskeletal   Abdominal   Peds  Hematology negative hematology ROS (+)   Anesthesia Other Findings   Reproductive/Obstetrics negative OB ROS                            Anesthesia Physical Anesthesia Plan  ASA: III  Anesthesia Plan: General   Post-op Pain Management:    Induction: Intravenous  PONV Risk Score and Plan: 2 and Ondansetron, Dexamethasone and Midazolam  Airway Management Planned: Oral ETT  Additional Equipment: Arterial line, CVP and Ultrasound Guidance Line Placement  Intra-op Plan:   Post-operative Plan: Extubation in OR  Informed Consent: I have reviewed the patients History and Physical, chart, labs and discussed the procedure including the risks, benefits and alternatives for the proposed anesthesia with the patient or authorized representative who has indicated his/her understanding and acceptance.   Dental advisory given  Plan Discussed with: CRNA  Anesthesia Plan Comments:         Anesthesia Quick Evaluation

## 2017-12-02 NOTE — Care Management Important Message (Signed)
Important Message  Patient Details  Name: Austin [name] MRN: [id number] Date of Birth: 07/12/1951   Medicare Important Message Given:  Yes    Leondra Cullin Stefan ChurchBratton 12/02/2017, 11:11 AM

## 2017-12-02 NOTE — Progress Notes (Signed)
Positive MRSA PCR, message sent to Dr Jerolyn Center. MRSA positive protocol started

## 2017-12-02 NOTE — Plan of Care (Signed)
Pt did not comply with leg elevation for the morning until pain began to increase in lower extremities.  Taught to avoid adding heat (pt req. blankets from warmer) with swelling and infection

## 2017-12-03 ENCOUNTER — Encounter (HOSPITAL_COMMUNITY)
Admission: EM | Disposition: A | Discharge status: Home Health | Payer: Self-pay | Source: Home / Self Care | Attending: Surgery

## 2017-12-03 ENCOUNTER — Inpatient Hospital Stay (HOSPITAL_COMMUNITY): Payer: Medicare Other

## 2017-12-03 ENCOUNTER — Inpatient Hospital Stay (HOSPITAL_COMMUNITY): Payer: Medicare Other | Admitting: Certified Registered Nurse Anesthetist

## 2017-12-03 DIAGNOSIS — I10 Essential (primary) hypertension: Secondary | ICD-10-CM

## 2017-12-03 DIAGNOSIS — L03032 Cellulitis of left toe: Secondary | ICD-10-CM

## 2017-12-03 DIAGNOSIS — I7410 Embolism and thrombosis of unspecified parts of aorta: Secondary | ICD-10-CM

## 2017-12-03 DIAGNOSIS — I700 Atherosclerosis of aorta: Secondary | ICD-10-CM

## 2017-12-03 HISTORY — PX: APPLICATION OF WOUND VAC: SHX5189

## 2017-12-03 HISTORY — PX: AORTA - BILATERAL FEMORAL ARTERY BYPASS GRAFT: SHX1175

## 2017-12-03 LAB — CBC
HCT: 33.5 % — ABNORMAL LOW (ref 39.0–52.0)
HEMATOCRIT: 37.4 % — AB (ref 39.0–52.0)
HEMOGLOBIN: 11.9 g/dL — AB (ref 13.0–17.0)
Hemoglobin: 10.8 g/dL — ABNORMAL LOW (ref 13.0–17.0)
MCH: 29 pg (ref 26.0–34.0)
MCH: 29.2 pg (ref 26.0–34.0)
MCHC: 31.8 g/dL (ref 30.0–36.0)
MCHC: 32.2 g/dL (ref 30.0–36.0)
MCV: 91.2 fL (ref 78.0–100.0)
MCV: 90.5 fL (ref 78.0–100.0)
PLATELETS: 248 10*3/uL (ref 150–400)
Platelets: 291 10*3/uL (ref 150–400)
RBC: 4.1 MIL/uL — AB (ref 4.22–5.81)
RBC: 3.7 MIL/uL — ABNORMAL LOW (ref 4.22–5.81)
RDW: 14.3 % (ref 11.5–15.5)
RDW: 14.2 % (ref 11.5–15.5)
WBC: 8.4 10*3/uL (ref 4.0–10.5)
WBC: 14.2 10*3/uL — ABNORMAL HIGH (ref 4.0–10.5)

## 2017-12-03 LAB — POCT I-STAT 7, (LYTES, BLD GAS, ICA,H+H)
ACID-BASE DEFICIT: 3 mmol/L — AB (ref 0.0–2.0)
Acid-base deficit: 3 mmol/L — ABNORMAL HIGH (ref 0.0–2.0)
BICARBONATE: 22.6 mmol/L (ref 20.0–28.0)
Bicarbonate: 21.8 mmol/L (ref 20.0–28.0)
CALCIUM ION: 1.1 mmol/L — AB (ref 1.15–1.40)
CALCIUM ION: 1.12 mmol/L — AB (ref 1.15–1.40)
HCT: 30 % — ABNORMAL LOW (ref 39.0–52.0)
HCT: 29 % — ABNORMAL LOW (ref 39.0–52.0)
Hemoglobin: 10.2 g/dL — ABNORMAL LOW (ref 13.0–17.0)
Hemoglobin: 9.9 g/dL — ABNORMAL LOW (ref 13.0–17.0)
O2 SAT: 99 %
O2 Saturation: 98 %
PCO2 ART: 38.6 mmHg (ref 32.0–48.0)
PH ART: 7.341 — AB (ref 7.350–7.450)
PO2 ART: 156 mmHg — AB (ref 83.0–108.0)
POTASSIUM: 3.5 mmol/L (ref 3.5–5.1)
Potassium: 4.3 mmol/L (ref 3.5–5.1)
SODIUM: 142 mmol/L (ref 135–145)
SODIUM: 140 mmol/L (ref 135–145)
TCO2: 23 mmol/L (ref 22–32)
TCO2: 24 mmol/L (ref 22–32)
pCO2 arterial: 41.8 mmHg (ref 32.0–48.0)
pH, Arterial: 7.359 (ref 7.350–7.450)
pO2, Arterial: 105 mmHg (ref 83.0–108.0)

## 2017-12-03 LAB — BLOOD GAS, ARTERIAL
Acid-base deficit: 3.7 mmol/L — ABNORMAL HIGH (ref 0.0–2.0)
Bicarbonate: 21.3 mmol/L (ref 20.0–28.0)
Drawn by: 448981
O2 SAT: 81.7 %
PCO2 ART: 41.5 mmHg (ref 32.0–48.0)
PH ART: 7.329 — AB (ref 7.350–7.450)
Patient temperature: 97.9
pO2, Arterial: 51.5 mmHg — ABNORMAL LOW (ref 83.0–108.0)

## 2017-12-03 LAB — HEMOGLOBIN A1C
HEMOGLOBIN A1C: 5.6 % (ref 4.8–5.6)
MEAN PLASMA GLUCOSE: 114.02 mg/dL

## 2017-12-03 LAB — BASIC METABOLIC PANEL
ANION GAP: 11 (ref 5–15)
Anion gap: 11 (ref 5–15)
BUN: 13 mg/dL (ref 6–20)
BUN: 12 mg/dL (ref 6–20)
CHLORIDE: 104 mmol/L (ref 101–111)
CO2: 22 mmol/L (ref 22–32)
CO2: 20 mmol/L — ABNORMAL LOW (ref 22–32)
CREATININE: 1.14 mg/dL (ref 0.61–1.24)
Calcium: 8.6 mg/dL — ABNORMAL LOW (ref 8.9–10.3)
Calcium: 7.9 mg/dL — ABNORMAL LOW (ref 8.9–10.3)
Chloride: 108 mmol/L (ref 101–111)
Creatinine, Ser: 0.91 mg/dL (ref 0.61–1.24)
GFR calc Af Amer: >60 mL/min (ref >60)
GFR calc non Af Amer: >60 mL/min (ref >60)
GLUCOSE: 156 mg/dL — AB (ref 65–99)
Glucose, Bld: 87 mg/dL (ref 65–99)
Potassium: 4.4 mmol/L (ref 3.5–5.1)
Potassium: 4.1 mmol/L (ref 3.5–5.1)
SODIUM: 137 mmol/L (ref 135–145)
Sodium: 139 mmol/L (ref 135–145)

## 2017-12-03 LAB — POCT ACTIVATED CLOTTING TIME
ACTIVATED CLOTTING TIME: 224 s
Activated Clotting Time: 186 seconds
Activated Clotting Time: 246 seconds

## 2017-12-03 LAB — APTT: aPTT: 36 seconds (ref 24–36)

## 2017-12-03 LAB — PROTIME-INR
INR: 1.17
Prothrombin Time: 14.8 seconds (ref 11.4–15.2)

## 2017-12-03 LAB — MAGNESIUM: Magnesium: 1.7 mg/dL (ref 1.7–2.4)

## 2017-12-03 SURGERY — CREATION, BYPASS, ARTERIAL, AORTA TO FEMORAL, BILATERAL, USING GRAFT
Anesthesia: General | Site: Groin

## 2017-12-03 MED ORDER — SODIUM CHLORIDE 0.9% FLUSH
10.0000 mL | Freq: Two times a day (BID) | INTRAVENOUS | Status: DC
  Administered 2017-12-04 – 2017-12-10 (×9): 10 mL

## 2017-12-03 MED ORDER — HYDROMORPHONE HCL 1 MG/ML IJ SOLN
INTRAMUSCULAR | Status: AC
  Filled 2017-12-03: qty 1

## 2017-12-03 MED ORDER — ONDANSETRON HCL 4 MG/2ML IJ SOLN: 4.0000 mg | Freq: Four times a day (QID) | INTRAMUSCULAR | Status: DC | PRN

## 2017-12-03 MED ORDER — FENTANYL CITRATE (PF) 100 MCG/2ML IJ SOLN
INTRAMUSCULAR | Status: DC | PRN
  Administered 2017-12-03: 100 ug via INTRAVENOUS
  Administered 2017-12-03: 50 ug via INTRAVENOUS
  Administered 2017-12-03: 100 ug via INTRAVENOUS
  Administered 2017-12-03 (×2): 50 ug via INTRAVENOUS
  Administered 2017-12-03: 100 ug via INTRAVENOUS
  Administered 2017-12-03 (×3): 50 ug via INTRAVENOUS

## 2017-12-03 MED ORDER — SUGAMMADEX SODIUM 200 MG/2ML IV SOLN
INTRAVENOUS | Status: AC
  Filled 2017-12-03: qty 2

## 2017-12-03 MED ORDER — BISACODYL 10 MG RE SUPP: 10.0000 mg | Freq: Every day | RECTAL | Status: DC | PRN

## 2017-12-03 MED ORDER — ROCURONIUM BROMIDE 10 MG/ML (PF) SYRINGE
PREFILLED_SYRINGE | INTRAVENOUS | Status: AC
  Filled 2017-12-03: qty 10

## 2017-12-03 MED ORDER — PROTAMINE SULFATE 10 MG/ML IV SOLN
INTRAVENOUS | Status: AC
  Filled 2017-12-03: qty 5

## 2017-12-03 MED ORDER — CEFAZOLIN SODIUM-DEXTROSE 2-4 GM/100ML-% IV SOLN
2.0000 g | Freq: Three times a day (TID) | INTRAVENOUS | Status: AC
  Administered 2017-12-03 – 2017-12-04 (×2): 2 g via INTRAVENOUS
  Filled 2017-12-03 (×2): qty 100

## 2017-12-03 MED ORDER — ALBUMIN HUMAN 5 % IV SOLN
INTRAVENOUS | Status: DC | PRN
  Administered 2017-12-03 (×3): via INTRAVENOUS

## 2017-12-03 MED ORDER — CEFAZOLIN SODIUM 1 G IJ SOLR
INTRAMUSCULAR | Status: AC
  Filled 2017-12-03: qty 20

## 2017-12-03 MED ORDER — CLEVIDIPINE BUTYRATE 0.5 MG/ML IV EMUL
INTRAVENOUS | Status: DC | PRN
  Administered 2017-12-03: 1 mg/h via INTRAVENOUS

## 2017-12-03 MED ORDER — LACTATED RINGERS IV SOLN
INTRAVENOUS | Status: DC | PRN
  Administered 2017-12-03 (×3): via INTRAVENOUS

## 2017-12-03 MED ORDER — PROTAMINE SULFATE 10 MG/ML IV SOLN
INTRAVENOUS | Status: DC | PRN
  Administered 2017-12-03: 10 mg via INTRAVENOUS
  Administered 2017-12-03: 30 mg via INTRAVENOUS
  Administered 2017-12-03: 10 mg via INTRAVENOUS

## 2017-12-03 MED ORDER — CLEVIDIPINE BUTYRATE 0.5 MG/ML IV EMUL
0.5000 mg/h | INTRAVENOUS | Status: DC
  Filled 2017-12-03: qty 100

## 2017-12-03 MED ORDER — PROPOFOL 10 MG/ML IV BOLUS
INTRAVENOUS | Status: DC | PRN
  Administered 2017-12-03: 200 mg via INTRAVENOUS

## 2017-12-03 MED ORDER — FENTANYL CITRATE (PF) 250 MCG/5ML IJ SOLN
INTRAMUSCULAR | Status: AC
  Filled 2017-12-03: qty 5

## 2017-12-03 MED ORDER — HEPARIN SODIUM (PORCINE) 1000 UNIT/ML IJ SOLN
INTRAMUSCULAR | Status: AC
  Filled 2017-12-03: qty 3

## 2017-12-03 MED ORDER — DEXAMETHASONE SODIUM PHOSPHATE 10 MG/ML IJ SOLN
INTRAMUSCULAR | Status: AC
  Filled 2017-12-03: qty 1

## 2017-12-03 MED ORDER — DIPHENHYDRAMINE HCL 12.5 MG/5ML PO ELIX
12.5000 mg | ORAL_SOLUTION | Freq: Four times a day (QID) | ORAL | Status: DC | PRN
  Filled 2017-12-03: qty 5

## 2017-12-03 MED ORDER — LABETALOL HCL 5 MG/ML IV SOLN
10.0000 mg | INTRAVENOUS | Status: AC | PRN
  Administered 2017-12-03 – 2017-12-05 (×4): 10 mg via INTRAVENOUS
  Filled 2017-12-03 (×5): qty 4

## 2017-12-03 MED ORDER — CEFAZOLIN SODIUM-DEXTROSE 1-4 GM/50ML-% IV SOLN
INTRAVENOUS | Status: DC | PRN
  Administered 2017-12-03 (×2): 2 g via INTRAVENOUS

## 2017-12-03 MED ORDER — NITROGLYCERIN IN D5W 200-5 MCG/ML-% IV SOLN
INTRAVENOUS | Status: DC | PRN
  Administered 2017-12-03: 5 ug/min via INTRAVENOUS

## 2017-12-03 MED ORDER — SODIUM CHLORIDE 0.9 % IV SOLN: 500.0000 mL | Freq: Once | INTRAVENOUS | Status: DC | PRN

## 2017-12-03 MED ORDER — METOPROLOL TARTRATE 5 MG/5ML IV SOLN: 2.0000 mg | INTRAVENOUS | Status: DC | PRN

## 2017-12-03 MED ORDER — DIPHENHYDRAMINE HCL 50 MG/ML IJ SOLN: 12.5000 mg | Freq: Four times a day (QID) | INTRAMUSCULAR | Status: DC | PRN

## 2017-12-03 MED ORDER — MIDAZOLAM HCL 5 MG/5ML IJ SOLN
INTRAMUSCULAR | Status: DC | PRN
  Administered 2017-12-03: 2 mg via INTRAVENOUS

## 2017-12-03 MED ORDER — NALOXONE HCL 0.4 MG/ML IJ SOLN: 0.4000 mg | INTRAMUSCULAR | Status: DC | PRN

## 2017-12-03 MED ORDER — HEPARIN SODIUM (PORCINE) 1000 UNIT/ML IJ SOLN
INTRAMUSCULAR | Status: DC | PRN
  Administered 2017-12-03: 3000 [IU] via INTRAVENOUS
  Administered 2017-12-03: 2000 [IU] via INTRAVENOUS
  Administered 2017-12-03: 1000 [IU] via INTRAVENOUS
  Administered 2017-12-03: 9000 [IU] via INTRAVENOUS

## 2017-12-03 MED ORDER — SODIUM CHLORIDE 0.9 % IV SOLN
INTRAVENOUS | Status: DC | PRN
  Administered 2017-12-03: 08:00:00

## 2017-12-03 MED ORDER — KCL IN DEXTROSE-NACL 20-5-0.45 MEQ/L-%-% IV SOLN
INTRAVENOUS | Status: AC
  Filled 2017-12-03: qty 1000

## 2017-12-03 MED ORDER — PANTOPRAZOLE SODIUM 40 MG PO TBEC
40.0000 mg | DELAYED_RELEASE_TABLET | Freq: Every day | ORAL | Status: DC
  Filled 2017-12-03: qty 1

## 2017-12-03 MED ORDER — GUAIFENESIN-DM 100-10 MG/5ML PO SYRP: 15.0000 mL | ORAL_SOLUTION | ORAL | Status: DC | PRN

## 2017-12-03 MED ORDER — SUGAMMADEX SODIUM 200 MG/2ML IV SOLN
INTRAVENOUS | Status: DC | PRN
  Administered 2017-12-03: 400 mg via INTRAVENOUS

## 2017-12-03 MED ORDER — 0.9 % SODIUM CHLORIDE (POUR BTL) OPTIME
TOPICAL | Status: DC | PRN
  Administered 2017-12-03: 3000 mL

## 2017-12-03 MED ORDER — LABETALOL HCL 5 MG/ML IV SOLN
INTRAVENOUS | Status: DC | PRN
  Administered 2017-12-03 (×2): 5 mg via INTRAVENOUS

## 2017-12-03 MED ORDER — HYDRALAZINE HCL 20 MG/ML IJ SOLN
5.0000 mg | INTRAMUSCULAR | Status: AC | PRN
  Administered 2017-12-03 (×2): 5 mg via INTRAVENOUS
  Filled 2017-12-03: qty 1

## 2017-12-03 MED ORDER — ONDANSETRON HCL 4 MG/2ML IJ SOLN
INTRAMUSCULAR | Status: AC
  Filled 2017-12-03: qty 2

## 2017-12-03 MED ORDER — ENOXAPARIN SODIUM 40 MG/0.4ML ~~LOC~~ SOLN
40.0000 mg | SUBCUTANEOUS | Status: DC
  Administered 2017-12-04 – 2017-12-09 (×6): 40 mg via SUBCUTANEOUS
  Filled 2017-12-03 (×7): qty 0.4

## 2017-12-03 MED ORDER — PHENYLEPHRINE 40 MCG/ML (10ML) SYRINGE FOR IV PUSH (FOR BLOOD PRESSURE SUPPORT)
PREFILLED_SYRINGE | INTRAVENOUS | Status: AC
  Filled 2017-12-03: qty 20

## 2017-12-03 MED ORDER — ALUM & MAG HYDROXIDE-SIMETH 200-200-20 MG/5ML PO SUSP: 15.0000 mL | ORAL | Status: DC | PRN

## 2017-12-03 MED ORDER — SODIUM CHLORIDE 0.9% FLUSH: 10.0000 mL | INTRAVENOUS | Status: DC | PRN

## 2017-12-03 MED ORDER — MUPIROCIN 2 % EX OINT
1.0000 "application " | TOPICAL_OINTMENT | Freq: Two times a day (BID) | CUTANEOUS | Status: AC
  Administered 2017-12-03 – 2017-12-07 (×9): 1 via NASAL
  Filled 2017-12-03 (×5): qty 22

## 2017-12-03 MED ORDER — POTASSIUM CHLORIDE CRYS ER 20 MEQ PO TBCR: 20.0000 meq | EXTENDED_RELEASE_TABLET | Freq: Every day | ORAL | Status: DC | PRN

## 2017-12-03 MED ORDER — CLEVIDIPINE BUTYRATE 0.5 MG/ML IV EMUL
0.0000 mg/h | INTRAVENOUS | Status: DC
  Administered 2017-12-03: 19 mg/h via INTRAVENOUS
  Administered 2017-12-03: 15 mg/h via INTRAVENOUS
  Administered 2017-12-03: 21 mg/h via INTRAVENOUS
  Administered 2017-12-04: 20 mg/h via INTRAVENOUS
  Administered 2017-12-04: 21 mg/h via INTRAVENOUS
  Administered 2017-12-05: 8 mg/h via INTRAVENOUS
  Administered 2017-12-05: 1 mg/h via INTRAVENOUS
  Filled 2017-12-03 (×5): qty 100
  Filled 2017-12-03 (×2): qty 50
  Filled 2017-12-03 (×2): qty 100

## 2017-12-03 MED ORDER — KCL IN DEXTROSE-NACL 20-5-0.45 MEQ/L-%-% IV SOLN
INTRAVENOUS | Status: DC
  Administered 2017-12-04 (×2): via INTRAVENOUS
  Administered 2017-12-04 – 2017-12-05 (×2): 125 mL/h via INTRAVENOUS
  Filled 2017-12-03 (×5): qty 1000

## 2017-12-03 MED ORDER — ACETAMINOPHEN 325 MG RE SUPP: 325.0000 mg | RECTAL | Status: DC | PRN

## 2017-12-03 MED ORDER — HEMOSTATIC AGENTS (NO CHARGE) OPTIME
TOPICAL | Status: DC | PRN
  Administered 2017-12-03 (×2): 1 via TOPICAL

## 2017-12-03 MED ORDER — DOCUSATE SODIUM 100 MG PO CAPS
100.0000 mg | ORAL_CAPSULE | Freq: Every day | ORAL | Status: DC
  Filled 2017-12-03 (×2): qty 1

## 2017-12-03 MED ORDER — MORPHINE SULFATE 2 MG/ML IV SOLN
INTRAVENOUS | Status: DC
  Administered 2017-12-03: 3 mg via INTRAVENOUS
  Administered 2017-12-03: 6 mg via INTRAVENOUS
  Administered 2017-12-03 – 2017-12-04 (×2): 1.5 mg via INTRAVENOUS
  Administered 2017-12-04: 2 mL via INTRAVENOUS
  Administered 2017-12-04: 2 mg via INTRAVENOUS
  Administered 2017-12-04: 15 mg via INTRAVENOUS
  Administered 2017-12-04: 3 mg via INTRAVENOUS
  Administered 2017-12-05: 7.5 mg via INTRAVENOUS
  Administered 2017-12-05 (×2): 3 mg via INTRAVENOUS
  Administered 2017-12-06: 0 mg via INTRAVENOUS
  Administered 2017-12-06: 1.5 mg via INTRAVENOUS
  Administered 2017-12-06 (×3): 0 mg via INTRAVENOUS
  Filled 2017-12-03 (×2): qty 30

## 2017-12-03 MED ORDER — HYDROMORPHONE HCL 1 MG/ML IJ SOLN
0.2500 mg | INTRAMUSCULAR | Status: DC | PRN
  Administered 2017-12-03 (×2): 0.5 mg via INTRAVENOUS

## 2017-12-03 MED ORDER — PROPOFOL 10 MG/ML IV BOLUS
INTRAVENOUS | Status: AC
  Filled 2017-12-03: qty 40

## 2017-12-03 MED ORDER — ONDANSETRON HCL 4 MG/2ML IJ SOLN
INTRAMUSCULAR | Status: DC | PRN
  Administered 2017-12-03: 4 mg via INTRAVENOUS

## 2017-12-03 MED ORDER — ROCURONIUM BROMIDE 100 MG/10ML IV SOLN
INTRAVENOUS | Status: DC | PRN
  Administered 2017-12-03: 10 mg via INTRAVENOUS
  Administered 2017-12-03: 70 mg via INTRAVENOUS
  Administered 2017-12-03: 30 mg via INTRAVENOUS
  Administered 2017-12-03: 50 mg via INTRAVENOUS

## 2017-12-03 MED ORDER — PHENOL 1.4 % MT LIQD: 1.0000 | OROMUCOSAL | Status: DC | PRN

## 2017-12-03 MED ORDER — SODIUM CHLORIDE 0.9% FLUSH: 9.0000 mL | INTRAVENOUS | Status: DC | PRN

## 2017-12-03 MED ORDER — MIDAZOLAM HCL 2 MG/2ML IJ SOLN
INTRAMUSCULAR | Status: AC
  Filled 2017-12-03: qty 2

## 2017-12-03 MED ORDER — MAGNESIUM SULFATE 2 GM/50ML IV SOLN: 2.0000 g | Freq: Every day | INTRAVENOUS | Status: DC | PRN

## 2017-12-03 MED ORDER — PHENYLEPHRINE HCL 10 MG/ML IJ SOLN
INTRAVENOUS | Status: DC | PRN
  Administered 2017-12-03: 20 ug/min via INTRAVENOUS

## 2017-12-03 MED ORDER — DEXAMETHASONE SODIUM PHOSPHATE 10 MG/ML IJ SOLN
INTRAMUSCULAR | Status: DC | PRN
  Administered 2017-12-03: 4 mg via INTRAVENOUS

## 2017-12-03 MED ORDER — ACETAMINOPHEN 325 MG PO TABS: 325.0000 mg | ORAL_TABLET | ORAL | Status: DC | PRN

## 2017-12-03 MED ORDER — CHLORHEXIDINE GLUCONATE CLOTH 2 % EX PADS: 6.0000 | MEDICATED_PAD | Freq: Every day | CUTANEOUS | Status: DC

## 2017-12-03 MED ORDER — CHLORHEXIDINE GLUCONATE CLOTH 2 % EX PADS
6.0000 | MEDICATED_PAD | Freq: Every day | CUTANEOUS | Status: AC
  Administered 2017-12-05 – 2017-12-06 (×2): 6 via TOPICAL

## 2017-12-03 SURGICAL SUPPLY — 69 items
CANISTER SUCT 3000ML PPV (MISCELLANEOUS) ×3 IMPLANT
CLIP VESOCCLUDE MED 24/CT (CLIP) ×3 IMPLANT
CLIP VESOCCLUDE SM WIDE 24/CT (CLIP) ×3 IMPLANT
CONNECTOR Y ATS VAC SYSTEM (MISCELLANEOUS) ×3 IMPLANT
COVER MAYO STAND STRL (DRAPES) ×3 IMPLANT
COVER SURGICAL LIGHT HANDLE (MISCELLANEOUS) ×3 IMPLANT
DERMABOND ADVANCED (GAUZE/BANDAGES/DRESSINGS) ×1
DERMABOND ADVANCED .7 DNX12 (GAUZE/BANDAGES/DRESSINGS) ×2 IMPLANT
DRESSING PREVENA PLUS CUSTOM (GAUZE/BANDAGES/DRESSINGS) ×4 IMPLANT
DRSG PREVENA PLUS CUSTOM (GAUZE/BANDAGES/DRESSINGS) ×6
ELECT BLADE 4.0 EZ CLEAN MEGAD (MISCELLANEOUS) ×3
ELECT BLADE 6.5 EXT (BLADE) ×3 IMPLANT
ELECT CAUTERY BLADE 6.4 (BLADE) ×3 IMPLANT
ELECT REM PT RETURN 9FT ADLT (ELECTROSURGICAL) ×3
ELECTRODE BLDE 4.0 EZ CLN MEGD (MISCELLANEOUS) ×2 IMPLANT
ELECTRODE REM PT RTRN 9FT ADLT (ELECTROSURGICAL) ×2 IMPLANT
FELT TEFLON 1X6 (MISCELLANEOUS) ×3 IMPLANT
FELT TEFLON 4 X1 (Mesh General) ×3 IMPLANT
GLOVE BIO SURGEON STRL SZ 6.5 (GLOVE) ×6 IMPLANT
GLOVE BIOGEL PI IND STRL 6.5 (GLOVE) ×2 IMPLANT
GLOVE BIOGEL PI IND STRL 7.0 (GLOVE) ×4 IMPLANT
GLOVE BIOGEL PI IND STRL 7.5 (GLOVE) ×4 IMPLANT
GLOVE BIOGEL PI INDICATOR 6.5 (GLOVE) ×1
GLOVE BIOGEL PI INDICATOR 7.0 (GLOVE) ×2
GLOVE BIOGEL PI INDICATOR 7.5 (GLOVE) ×2
GLOVE ECLIPSE 7.0 STRL STRAW (GLOVE) ×3 IMPLANT
GLOVE SURG SS PI 7.0 STRL IVOR (GLOVE) ×9 IMPLANT
GLOVE SURG SS PI 7.5 STRL IVOR (GLOVE) ×3 IMPLANT
GOWN STRL REUS W/ TWL LRG LVL3 (GOWN DISPOSABLE) ×8 IMPLANT
GOWN STRL REUS W/ TWL XL LVL3 (GOWN DISPOSABLE) ×8 IMPLANT
GOWN STRL REUS W/TWL LRG LVL3 (GOWN DISPOSABLE) ×4
GOWN STRL REUS W/TWL XL LVL3 (GOWN DISPOSABLE) ×4
GRAFT HEMASHIELD 14X7MM (Vascular Products) ×3 IMPLANT
HEMOSTAT SNOW SURGICEL 2X4 (HEMOSTASIS) ×6 IMPLANT
INSERT FOGARTY 61MM (MISCELLANEOUS) ×3 IMPLANT
INSERT FOGARTY SM (MISCELLANEOUS) ×12 IMPLANT
KIT BASIN OR (CUSTOM PROCEDURE TRAY) ×3 IMPLANT
KIT ROOM TURNOVER OR (KITS) ×3 IMPLANT
LOOP VESSEL MINI RED (MISCELLANEOUS) ×3 IMPLANT
NS IRRIG 1000ML POUR BTL (IV SOLUTION) ×6 IMPLANT
PACK AORTA (CUSTOM PROCEDURE TRAY) ×3 IMPLANT
PAD ARMBOARD 7.5X6 YLW CONV (MISCELLANEOUS) ×6 IMPLANT
PENCIL BUTTON HOLSTER BLD 10FT (ELECTRODE) ×3 IMPLANT
PUNCH AORTIC ROTATE 5MM 8IN (MISCELLANEOUS) ×3 IMPLANT
SPONGE LAP 18X18 X RAY DECT (DISPOSABLE) ×3 IMPLANT
SUT PDS AB 1 TP1 54 (SUTURE) ×6 IMPLANT
SUT PROLENE 3 0 SH 48 (SUTURE) ×6 IMPLANT
SUT PROLENE 3 0 SH DA (SUTURE) ×3 IMPLANT
SUT PROLENE 5 0 C 1 24 (SUTURE) ×9 IMPLANT
SUT PROLENE 6 0 BV (SUTURE) ×15 IMPLANT
SUT SILK 2 0 (SUTURE) ×1
SUT SILK 2 0 TIES 17X18 (SUTURE) ×1
SUT SILK 2 0SH CR/8 30 (SUTURE) ×3 IMPLANT
SUT SILK 2-0 18XBRD TIE 12 (SUTURE) ×2 IMPLANT
SUT SILK 2-0 18XBRD TIE BLK (SUTURE) ×2 IMPLANT
SUT SILK 3 0 (SUTURE) ×1
SUT SILK 3 0 TIES 17X18 (SUTURE) ×1
SUT SILK 3-0 18XBRD TIE 12 (SUTURE) ×2 IMPLANT
SUT SILK 3-0 18XBRD TIE BLK (SUTURE) ×2 IMPLANT
SUT VIC AB 2-0 CT1 27 (SUTURE) ×5
SUT VIC AB 2-0 CT1 TAPERPNT 27 (SUTURE) ×10 IMPLANT
SUT VIC AB 3-0 SH 27 (SUTURE) ×2
SUT VIC AB 3-0 SH 27X BRD (SUTURE) ×4 IMPLANT
SUT VICRYL 4-0 PS2 18IN ABS (SUTURE) ×12 IMPLANT
TOWEL BLUE STERILE X RAY DET (MISCELLANEOUS) ×6 IMPLANT
TOWEL GREEN STERILE (TOWEL DISPOSABLE) ×3 IMPLANT
TRAY FOLEY MTR SLVR 16FR STAT (CATHETERS) ×3 IMPLANT
WATER STERILE IRR 1000ML POUR (IV SOLUTION) ×6 IMPLANT
WND VAC CANISTER 500ML (MISCELLANEOUS) ×3 IMPLANT

## 2017-12-03 NOTE — Anesthesia Procedure Notes (Signed)
Arterial Line Insertion Start/End3/20/2019 8:00 AM Performed by: Malachi Carlho, [place], CRNA, CRNA  Patient location: Pre-op. Preanesthetic checklist: patient identified, IV checked, site marked, risks and benefits discussed, surgical consent, monitors and equipment checked and pre-op evaluation Lidocaine 1% used for infiltration and patient sedated Left, radial was placed Catheter size: 20 G Hand hygiene performed , maximum sterile barriers used  and Seldinger technique used Allen's test indicative of satisfactory collateral circulation Attempts: 1 Procedure performed without using ultrasound guided technique. Ultrasound Notes:anatomy identified, needle tip was noted to be adjacent to the nerve/plexus identified and no ultrasound evidence of intravascular and/or intraneural injection Following insertion, Biopatch and dressing applied. Post procedure assessment: normal  Patient tolerated the procedure well with no immediate complications.

## 2017-12-03 NOTE — Op Note (Signed)
Patient name: Austin [name] MRN: [id number] DOB: Sep 06, 1951 Sex: male  12/03/2017 Pre-operative Diagnosis: left leg ulcer Post-operative diagnosis:  Same Surgeon:  Durene Cal Assistants:  S. Rhyne Procedure:   #1: Aortobifemoral bypass graft using a 14 x 7 bifurcated [place] graft   #2: Reimplantation of the inferior mesenteric artery   #3: Bilateral femoral Praveena wound VAC   #4: Aortic endarterectomy Anesthesia: General Blood Loss: 500 Specimens:  none  Findings: Proximal anastomosis was end to end.  Femoral anastomosis to the distal common femoral artery slightly out onto the profundofemoral artery bilaterally.  The inferior mesenteric artery was reimplanted on the left lateral side of the tube portion of the aortic graft.  Indications: The patient presented with a several month history of a left great toe ulcer.  He has dry gangrene.  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  He underwent cardiac clearance including cardiac angiography.  He is here today for his operation.  Procedure:  The patient was identified in the holding area and taken to [facility] OR ROOM 12  The patient was then placed supine on the table. general anesthesia was administered.  The patient was prepped and draped in the usual sterile fashion.  A time out was called and antibiotics were administered.  Longitudinal incisions were made in both groins.  Cautery was used about subtenons tissue down to the femoral sheath which was opened sharply.  Bilateral common femoral profunda femoral and superficial femoral arteries were individually isolated.  Both groins were packed with wet lap pads and attention was turned towards the abdomen.  A midline incision was made from the xiphoid to below the umbilicus.  Cautery was used about the subcutaneous tissue down to the midline fascia which was opened with cautery.  The peritoneal cavity was then entered sharply and the peritoneum was opened throughout the length of the incision.  The  abdominal contents were inspected.  There was no gross pathology.  A Balfour was placed, followed by the Omni-Tract retractor.  The omentum was reflected cephalad and the small bowel was mobilized to the patient's right.  The ligament of Treitz was taken down sharply.  The aorta was then dissected out.  I mobilized the aorta up to the right renal artery which was the lowest renal artery.  It was circumferentially exposed at this level.  I then isolated the inferior mesenteric artery.  Continuing distally identified the aortic bifurcation.  There was minimal manipulation of the nervous plexus within the common iliac arteries.  I created a tunnel between the abdominal and femoral incision, passing posterior to the ureter bilaterally.  An umbilical tape was passed.  Next, the patient was fully heparinized.  After the heparin circulated I occluded the infrarenal abdominal aorta with a Harken clamp.  A Zanger clamp was placed on the distal aorta and a clamp was placed on the inferior mesenteric artery.  A #11 blade was used to make an arteriotomy.  I used curved Mayo scissors to transect the aorta.  I then performed an endarterectomy on the distal end of the aorta and then oversewed it with 2 layers of 3-0 Prolene.  The Zanger clamp was released.  I then proceeded to perform an endarterectomy of the infrarenal abdominal aorta until I had healthy wall of the aorta remaining.  I selected a 14 x 7 bifurcated [place] graft.  I performed a end to end anastomosis in running fashion incorporating a felt strip with 3-0 Prolene.  Prior to completion the limbs of  the graft were occluded.  The anastomosis was completed and the Harken clamp was released.  The anastomosis was hemostatic.  Next, both limbs of the graft were brought through the respective groins.  I performed the right groin first.  The right common femoral artery was occluded with vascular clamps followed by occlusion of the profunda and superficial femoral artery.  A  #11 blade was used to make an arteriotomy which was extended longitudinally with Potts scissors.  This came down onto the profundofemoral artery for approximately 2 mm.  The graft was cut the appropriate length and beveled to fit the size of the arteriotomy.  A running anastomosis was created with 5-0 Prolene.  Prior to completion, the appropriate flushing maneuvers were performed and the anastomosis was completed.  Blood flow was reestablished to the right leg.  Next attention was turned towards the left groin.  Similarly, the common femoral profunda femoral and superficial femoral arteries were occluded with vascular clamps and a #11 blade was used to make an arteriotomy which was extended longitudinally with Potts scissors down onto the profundofemoral artery.  The left limb of the graft was cut the appropriate length and beveled to fit the size of the arteriotomy.  A running anastomosis was created with 5-0 Prolene.  Prior to completion the appropriate flushing maneuvers were performed and the anastomosis was completed.  Blood flow was reestablished to the left leg.  Hand-held Doppler was used to evaluate the signals in bilateral superficial femoral and profunda femoral arteries.  They had excellent signals.  Next, attention was turned back towards the abdomen.  I elected to reimplant the inferior mesenteric artery.  This was ligated at its origin.  There was pretty good backbleeding from the inferior mesenteric artery.  I selected a J-shaped plan for partial occlusion of the tube portion of the graft.  This was occluded.  A #11 blade was used to make a graftotomy on the left lateral side.  This was followed by a #5 punch.  A running end to side anastomosis was created between the graft and the inferior mesenteric artery.  After the appropriate flushing maneuvers were performed this anastomosis was completed and blood flow was reestablished to the inferior mesenteric artery.  At this point, I was satisfied with  repair.  50 mg of protamine was administered to reverse the heparin.  I then closed the retroperitoneum with running 2-0 Vicryl.  The small bowel was placed back into its anatomic position followed by the colon and omentum.  The abdominal fascia was closed with 2 running #1 PDS suture, followed by closure of the subtendinous tissue with 2-0 Vicryl and the skin with 4-0 Vicryl and Dermabond.  The groins were similarly closed with multiple layers of 2-0 and 3-0 Vicryl followed by 4-0 Vicryl and the skin and Praveena wound vacs.  The patient was then successfully extubated and taken to recovery room in stable condition.   Disposition: To PACU stable   V. Durene CalWells Joy Haegele, M.D. [facility] Office: [phone] Pager:  [phone]

## 2017-12-03 NOTE — Interval H&P Note (Signed)
History and Physical Interval Note:  12/03/2017 7:58 AM  Austin [name]  has presented today for surgery, with the diagnosis of CRITICAL LIMB ISCHEMIA  The various methods of treatment have been discussed with the patient and family. After consideration of risks, benefits and other options for treatment, the patient has consented to  Procedure(s): AORTA BIFEMORAL BYPASS GRAFT (N/A) as a surgical intervention .  The patient's history has been reviewed, patient examined, no change in status, stable for surgery.  I have reviewed the patient's chart and labs.  Questions were answered to the patient's satisfaction.     Durene CalWells Brabham

## 2017-12-03 NOTE — Anesthesia Procedure Notes (Signed)
Central Venous Catheter Insertion Performed by: Roderic Palau, MD, anesthesiologist Start/End3/20/2019 8:10 AM, 12/03/2017 8:20 AM Patient location: Pre-op. Preanesthetic checklist: patient identified, IV checked, site marked, risks and benefits discussed, surgical consent, monitors and equipment checked, pre-op evaluation, timeout performed and anesthesia consent Position: Trendelenburg Lidocaine 1% used for infiltration and patient sedated Hand hygiene performed , maximum sterile barriers used  and Seldinger technique used Catheter size: 9 Fr Total catheter length 10. Central line was placed.MAC introducer Procedure performed using ultrasound guided technique. Ultrasound Notes:anatomy identified, needle tip was noted to be adjacent to the nerve/plexus identified, no ultrasound evidence of intravascular and/or intraneural injection and image(s) printed for medical record Attempts: 1 Following insertion, line sutured, dressing applied and Biopatch. Post procedure assessment: blood return through all ports, free fluid flow and no air  Patient tolerated the procedure well with no immediate complications.

## 2017-12-03 NOTE — Progress Notes (Signed)
PROGRESS NOTE    Austin [name]  UEA:[id number] DOB: [birth year] DOA: 11/26/2017 PCP: Patient, No Pcp Per  Brief Narrative:67 year old male with no past medical history, lifelong smoker did not see a physician regularly presented to the emergency room with black discoloration of the left great toe with progressive swelling erythema and tenderness of the left foot. -and he was found to have severe critical limb ischemia, evaluated by vascular surgery and felt need aorto bifemoral bypass graft. -through his hospital stay he was found to have an abnormal echocardiogram with poor motion abnormality, subsequently underwent a stress test which was abnormal followed by a heart catheterization on 3/18 with surprisingly showed, normal coronaries with no significant CAD. 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 - severe PAD in lifelong smoker with critical limb ischemia -CT angiogram abdomen pelvis with runoff noted CT angiogram abdomen pelvis with runoff noted distal Aortic occlusion -For aortobifemoral bypass graft today  -x-ray without evidence evidence of osteomyelitis -remains on vancomycin and ceftriaxone for surrounding foot cellulitis -per VVS  Hypertension -continue amlodipine and metoprolol -hold lisinopril due to CTA/vanc and OR today -hydralazine PRN  Suspected COPD/heavy smoker -Stable, nebs when necessary  Abnormal stress test -No significant CAD noted on cardiac cath 3/18 -Continue aspirin and statin beta blocker  DVT prophylaxis: lovenox Code Status: Full code Family Communication: family at bedside Disposition Plan: Home when stable post Aortobifem bypass  Consultants:  VVS Cards   Procedures:   Left heart cath 3/18  Aortibifem bypass 3/20  Antimicrobials:    Subjective: -Pt seen in PACU, drowsy  Objective: Vitals:   12/02/17 1015 12/02/17 1500 12/02/17 2216  12/03/17 0556  BP:  (!) 150/70 (!) 162/70 (!) 174/70  Pulse: (!) 58 67 (!) 58 71  Resp:  18 18 19   Temp:  98 F (36.7 C) 98.1 F (36.7 C) 98.2 F (36.8 C)  TempSrc:  Oral Oral Oral  SpO2:  97% 100% 100%  Weight:      Height:  5\' 7"  (1.702 m)      Intake/Output Summary (Last 24 hours) at 12/03/2017 1422 Last data filed at 12/03/2017 1333 Gross per 24 hour  Intake 3100 ml  Output 800 ml  Net 2300 ml   Filed Weights   11/28/17 1059  Weight: 92.5 kg (204 lb)    Examination:  General exam: Drowsy in PACU Respiratory system: Clear to auscultation. Respiratory effort normal. Cardiovascular system: S1 & S2 heard, RRR. Gastrointestinal system: Abdomen is nondistended, soft and nontender.Normal bowel sounds heard. Central nervous system: Alert and oriented. No focal neurological deficits. Extremities:   L toe with black discoloration and dry scaly skin Skin: dark discoloration L foot esp great toe Psychiatry: Judgement and insight appear normal. Mood & affect appropriate.     Data Reviewed:   CBC: Recent Labs  Lab 11/26/17 1730 11/27/17 0719 12/02/17 0711 12/03/17 0514 12/03/17 0939 12/03/17 1226  WBC 10.4 8.3 7.7 8.4  --   --   NEUTROABS 7.1 5.3 5.1  --   --   --   HGB 13.1 12.6* 11.7* 11.9* 10.2* 9.9*  HCT 39.1 38.6* 36.0* 37.4* 30.0* 29.0*  MCV 89.9 89.4 91.1 91.2  --   --   PLT 295 266 290 291  --   --    Basic Metabolic Panel: Recent Labs  Lab 11/29/17 0509 11/30/17 4782 12/01/17 0404 12/02/17 9562 12/03/17 1308 12/03/17 6578  12/03/17 1226  NA 135 136 139 138 137 142 140  K 4.0 3.7 4.1 3.9 4.4 3.5 4.3  CL 104 106 110 107 104  --   --   CO2 22 22 23 22 22   --   --   GLUCOSE 97 130* 107* 115* 87  --   --   BUN 14 13 17 13 13   --   --   CREATININE 1.03 1.05 1.03 0.95 0.91  --   --   CALCIUM 8.5* 8.4* 8.3* 8.6* 8.6*  --   --    GFR: Estimated Creatinine Clearance: 87.8 mL/min (by C-G formula based on SCr of 0.91 mg/dL). Liver Function Tests: Recent  Labs  Lab 11/26/17 1730  AST 37  ALT 36  ALKPHOS 70  BILITOT 0.7  PROT 6.8  ALBUMIN 3.2*   No results for input(s): LIPASE, AMYLASE in the last 168 hours. No results for input(s): AMMONIA in the last 168 hours. Coagulation Profile: Recent Labs  Lab 12/01/17 1118  INR 1.03   Cardiac Enzymes: No results for input(s): CKTOTAL, CKMB, CKMBINDEX, TROPONINI in the last 168 hours. BNP (last 3 results) No results for input(s): PROBNP in the last 8760 hours. HbA1C: No results for input(s): HGBA1C in the last 72 hours. CBG: No results for input(s): GLUCAP in the last 168 hours. Lipid Profile: No results for input(s): CHOL, HDL, LDLCALC, TRIG, CHOLHDL, LDLDIRECT in the last 72 hours. Thyroid Function Tests: No results for input(s): TSH, T4TOTAL, FREET4, T3FREE, THYROIDAB in the last 72 hours. Anemia Panel: No results for input(s): VITAMINB12, FOLATE, FERRITIN, TIBC, IRON, RETICCTPCT in the last 72 hours. Urine analysis:    Component Value Date/Time   COLORURINE YELLOW 11/26/2017 2136   APPEARANCEUR CLEAR 11/26/2017 2136   LABSPEC 1.024 11/26/2017 2136   PHURINE 6.0 11/26/2017 2136   GLUCOSEU NEGATIVE 11/26/2017 2136   HGBUR NEGATIVE 11/26/2017 2136   BILIRUBINUR NEGATIVE 11/26/2017 2136   KETONESUR NEGATIVE 11/26/2017 2136   PROTEINUR NEGATIVE 11/26/2017 2136   NITRITE NEGATIVE 11/26/2017 2136   LEUKOCYTESUR NEGATIVE 11/26/2017 2136   Sepsis Labs: @LABRCNTIP (procalcitonin:4,lacticidven:4)  ) Recent Results (from the past 240 hour(s))  Blood culture (routine x 2)     Status: None   Collection Time: 11/26/17  5:38 PM  Result Value Ref Range Status   Specimen Description BLOOD LEFT ANTECUBITAL  Final   Special Requests   Final    BOTTLES DRAWN AEROBIC AND ANAEROBIC Blood Culture adequate volume   Culture   Final    NO GROWTH 5 DAYS Performed at [facility], [address] [place], [place] [address]    Report Status 12/01/2017 FINAL  Final  Blood culture  (routine x 2)     Status: None   Collection Time: 11/26/17  9:29 PM  Result Value Ref Range Status   Specimen Description BLOOD LEFT ANTECUBITAL  Final   Special Requests   Final    BOTTLES DRAWN AEROBIC AND ANAEROBIC Blood Culture adequate volume   Culture   Final    NO GROWTH 5 DAYS Performed at [facility], [address] [place], [place] [address]    Report Status 12/01/2017 FINAL  Final  Surgical PCR screen     Status: Abnormal   Collection Time: 12/02/17 11:42 AM  Result Value Ref Range Status   MRSA, PCR POSITIVE (A) NEGATIVE Final    Comment: RESULT CALLED TO, READ BACK BY AND VERIFIED WITH: Dione PloverB. Grace RN 13:25 12/02/17 ([email or web address])  Staphylococcus aureus POSITIVE (A) NEGATIVE Final    Comment: (NOTE) The Xpert SA Assay (FDA approved for NASAL specimens in patients 15 years of age and older), is one component of a comprehensive surveillance program. It is not intended to diagnose infection nor to guide or monitor treatment. Performed at [facility], [address] [place], [place] [address]          Radiology Studies: No results found.      Scheduled Meds: . [MAR Hold] amLODipine  10 mg Oral Daily  . [MAR Hold] aspirin  81 mg Oral Daily  . [MAR Hold] atorvastatin  80 mg Oral q1800  . [MAR Hold] Chlorhexidine Gluconate Cloth  6 each Topical Q0600  . [MAR Hold] enoxaparin (LOVENOX) injection  40 mg Subcutaneous Daily  . [MAR Hold] lisinopril  10 mg Oral Daily  . [MAR Hold] metoprolol succinate  50 mg Oral Daily  . [MAR Hold] mupirocin ointment  1 application Nasal BID  . [MAR Hold] sodium chloride flush  3 mL Intravenous Q12H   Continuous Infusions: . [MAR Hold] sodium chloride Stopped (12/03/17 1420)  . [MAR Hold] cefTRIAXone (ROCEPHIN)  IV Stopped (12/02/17 1035)  . clevidipine    . [MAR Hold] vancomycin Stopped (12/03/17 0042)     LOS: 7 days    Time spent:    Zannie Cove, MD [facility] Page via [email or web address],  password [email or web address] After 7PM please contact night-coverage  12/03/2017, 2:22 PM

## 2017-12-03 NOTE — Progress Notes (Addendum)
0740 NPO post midnight maint. Report was given to preop. Pt to preop area.  1200 Pt's wife Rodell Pernandrianne Ellerbe Innis came to get pt's belongings, a bag with pt's pants, wallet and dentures. Pt's wife was notified that pt is going to [facility] after recovery.  1720 The rest of pt's belongings, 3 bags was brought to 2H25.

## 2017-12-03 NOTE — Anesthesia Procedure Notes (Signed)
Procedure Name: Intubation Date/Time: 12/03/2017 9:08 AM Performed by: Roderic Palau, MD Pre-anesthesia Checklist: Patient identified, Emergency Drugs available, Suction available and Patient being monitored Patient Re-evaluated:Patient Re-evaluated prior to induction Oxygen Delivery Method: Circle system utilized Preoxygenation: Pre-oxygenation with 100% oxygen Induction Type: IV induction Ventilation: Mask ventilation without difficulty and Oral airway inserted - appropriate to patient size Laryngoscope Size: Glidescope and 3 (grade 3 view with Mac 4 on the first try.) Grade View: Grade I Tube type: Oral Number of attempts: 2 Airway Equipment and Method: Stylet Placement Confirmation: ETT inserted through vocal cords under direct vision,  positive ETCO2 and breath sounds checked- equal and bilateral Secured at: 20 cm Tube secured with: Tape Dental Injury: Teeth and Oropharynx as per pre-operative assessment

## 2017-12-03 NOTE — Transfer of Care (Signed)
Immediate Anesthesia Transfer of Care Note  Patient: Austin DurandStephen [name]  Procedure(s) Performed: AORTA BIFEMORAL BYPASS GRAFT USING 17X7 MM X 40CM HEMASHIELD GOLD GRAFT REIMPLANTATION IMA (N/A ) APPLICATION OF WOUND VAC (Bilateral Groin)  Patient Location: PACU  Anesthesia Type:General  Level of Consciousness: sedated and drowsy  Airway & Oxygen Therapy: Patient connected to face mask oxygen  Post-op Assessment: Post -op Vital signs reviewed and stable  Post vital signs: stable  Last Vitals:  Vitals:   12/02/17 2216 12/03/17 0556  BP: (!) 162/70 (!) 174/70  Pulse: (!) 58 71  Resp: 18 19  Temp: 36.7 C 36.8 C  SpO2: 100% 100%    Last Pain:  Vitals:   12/03/17 0556  TempSrc: Oral  PainSc:       Patients Stated Pain Goal: 0 (11/30/17 0308)  Complications: No apparent anesthesia complications

## 2017-12-04 ENCOUNTER — Encounter (HOSPITAL_COMMUNITY): Payer: Self-pay | Admitting: Surgery

## 2017-12-04 LAB — POCT I-STAT 3, ART BLOOD GAS (G3+)
ACID-BASE DEFICIT: 3 mmol/L — AB (ref 0.0–2.0)
Acid-base deficit: 3 mmol/L — ABNORMAL HIGH (ref 0.0–2.0)
BICARBONATE: 21.7 mmol/L (ref 20.0–28.0)
Bicarbonate: 22.5 mmol/L (ref 20.0–28.0)
O2 SAT: 77 %
O2 SAT: 80 %
PH ART: 7.38 (ref 7.350–7.450)
PO2 ART: 43 mmHg — AB (ref 83.0–108.0)
Patient temperature: 98.5
Patient temperature: 98.5
TCO2: 23 mmol/L (ref 22–32)
TCO2: 24 mmol/L (ref 22–32)
pCO2 arterial: 36.6 mmHg (ref 32.0–48.0)
pCO2 arterial: 39.3 mmHg (ref 32.0–48.0)
pH, Arterial: 7.366 (ref 7.350–7.450)
pO2, Arterial: 44 mmHg — ABNORMAL LOW (ref 83.0–108.0)

## 2017-12-04 LAB — POCT I-STAT, CHEM 8
BUN: 15 mg/dL (ref 6–20)
CREATININE: 0.9 mg/dL (ref 0.61–1.24)
Calcium, Ion: 1.1 mmol/L — ABNORMAL LOW (ref 1.15–1.40)
Chloride: 105 mmol/L (ref 101–111)
Glucose, Bld: 166 mg/dL — ABNORMAL HIGH (ref 65–99)
HEMATOCRIT: 34 % — AB (ref 39.0–52.0)
HEMOGLOBIN: 11.6 g/dL — AB (ref 13.0–17.0)
POTASSIUM: 4.2 mmol/L (ref 3.5–5.1)
SODIUM: 140 mmol/L (ref 135–145)
TCO2: 22 mmol/L (ref 22–32)

## 2017-12-04 LAB — CBC
HCT: 32.5 % — ABNORMAL LOW (ref 39.0–52.0)
HEMOGLOBIN: 10.5 g/dL — AB (ref 13.0–17.0)
MCH: 28.8 pg (ref 26.0–34.0)
MCHC: 32.3 g/dL (ref 30.0–36.0)
MCV: 89.3 fL (ref 78.0–100.0)
PLATELETS: 267 10*3/uL (ref 150–400)
RBC: 3.64 MIL/uL — ABNORMAL LOW (ref 4.22–5.81)
RDW: 14.1 % (ref 11.5–15.5)
WBC: 13.6 10*3/uL — ABNORMAL HIGH (ref 4.0–10.5)

## 2017-12-04 LAB — MAGNESIUM: Magnesium: 1.9 mg/dL (ref 1.7–2.4)

## 2017-12-04 LAB — COMPREHENSIVE METABOLIC PANEL
ALK PHOS: 53 U/L (ref 38–126)
ALT: 44 U/L (ref 17–63)
ANION GAP: 10 (ref 5–15)
AST: 45 U/L — ABNORMAL HIGH (ref 15–41)
Albumin: 2.8 g/dL — ABNORMAL LOW (ref 3.5–5.0)
BUN: 14 mg/dL (ref 6–20)
CALCIUM: 7.8 mg/dL — AB (ref 8.9–10.3)
CHLORIDE: 105 mmol/L (ref 101–111)
CO2: 21 mmol/L — AB (ref 22–32)
Creatinine, Ser: 1.17 mg/dL (ref 0.61–1.24)
GFR calc Af Amer: >60 mL/min (ref >60)
GFR calc non Af Amer: >60 mL/min (ref >60)
GLUCOSE: 158 mg/dL — AB (ref 65–99)
POTASSIUM: 4.2 mmol/L (ref 3.5–5.1)
SODIUM: 136 mmol/L (ref 135–145)
Total Bilirubin: 0.2 mg/dL — ABNORMAL LOW (ref 0.3–1.2)
Total Protein: 5.8 g/dL — ABNORMAL LOW (ref 6.5–8.1)

## 2017-12-04 LAB — AMYLASE: AMYLASE: 48 U/L (ref 28–100)

## 2017-12-04 MED ORDER — LORAZEPAM 2 MG/ML IJ SOLN
0.5000 mg | Freq: Four times a day (QID) | INTRAMUSCULAR | Status: DC | PRN
  Administered 2017-12-04: 0.5 mg via INTRAVENOUS
  Filled 2017-12-04: qty 1

## 2017-12-04 MED ORDER — KETOROLAC TROMETHAMINE 30 MG/ML IJ SOLN
15.0000 mg | Freq: Four times a day (QID) | INTRAMUSCULAR | Status: AC
  Administered 2017-12-04 – 2017-12-06 (×5): 15 mg via INTRAVENOUS
  Filled 2017-12-04 (×5): qty 1

## 2017-12-04 MED ORDER — METOPROLOL TARTRATE 5 MG/5ML IV SOLN
2.5000 mg | Freq: Four times a day (QID) | INTRAVENOUS | Status: DC
  Administered 2017-12-04 – 2017-12-06 (×10): 2.5 mg via INTRAVENOUS
  Filled 2017-12-04 (×9): qty 5

## 2017-12-04 MED ORDER — VANCOMYCIN HCL 10 G IV SOLR
1250.0000 mg | Freq: Two times a day (BID) | INTRAVENOUS | Status: DC
  Administered 2017-12-04 – 2017-12-10 (×12): 1250 mg via INTRAVENOUS
  Filled 2017-12-04 (×16): qty 1250

## 2017-12-04 MED ORDER — KETOROLAC TROMETHAMINE 30 MG/ML IJ SOLN
30.0000 mg | Freq: Four times a day (QID) | INTRAMUSCULAR | Status: DC
  Administered 2017-12-04 (×2): 30 mg via INTRAVENOUS
  Filled 2017-12-04 (×2): qty 1

## 2017-12-04 MED ORDER — SODIUM CHLORIDE 0.9 % IV SOLN
2.0000 g | INTRAVENOUS | Status: DC
  Administered 2017-12-04 – 2017-12-06 (×3): 2 g via INTRAVENOUS
  Filled 2017-12-04 (×3): qty 20

## 2017-12-04 NOTE — Progress Notes (Signed)
AM ABG as follows:  pH: 7.37 pCO2: 39.3 pO2: 43 HCO3: 22.5 sO2: 77  Rechecked and verified for accuracy. Pt is not in any acute distress, sats 91-92% on 2L [place], EtCO2 30s, awakens to voice, A&Ox4. Dr. Darrick PennaFields paged with the above results and assessment. RN inquired about need for BiPAP, no orders received at this time. [place] increased to 6L. Will continue to monitor.

## 2017-12-04 NOTE — Anesthesia Postprocedure Evaluation (Signed)
Anesthesia Post Note  Patient: Austin DurandStephen [name]  Procedure(s) Performed: AORTA BIFEMORAL BYPASS GRAFT USING 17X7 MM X 40CM HEMASHIELD GOLD GRAFT REIMPLANTATION IMA (N/A ) APPLICATION OF WOUND VAC (Bilateral Groin)     Patient location during evaluation: PACU Anesthesia Type: General Level of consciousness: awake and alert Pain management: pain level controlled Vital Signs Assessment: post-procedure vital signs reviewed and stable Respiratory status: spontaneous breathing, nonlabored ventilation, respiratory function stable and patient connected to nasal cannula oxygen Cardiovascular status: blood pressure returned to baseline and stable Postop Assessment: no apparent nausea or vomiting Anesthetic complications: no    Last Vitals:  Vitals Value Taken Time  BP    Temp    Pulse 79 12/04/2017  8:32 AM  Resp 14 12/04/2017  8:32 AM  SpO2 94 % 12/04/2017  8:32 AM  Vitals shown include unvalidated device data.  Last Pain:  Vitals:   12/04/17 0813  TempSrc: Oral  PainSc:                  Austin [name],W. EDMOND

## 2017-12-04 NOTE — Evaluation (Signed)
Physical Therapy Evaluation Patient Details Name: Austin [name] MRN: [id number] DOB: 09/15/1951 Today's Date: 12/04/2017   History of Present Illness  Pt adm with lt great toe gangrene. Pt underwent aorto bifemoral bypass graft and aortic enarterectomy on 12/03/17. PMH - back surgery, HTN, bil THR  Clinical Impression  Pt presents to PT with expected decr in mobility s/p aortobifemoral bypass graft. Expect pt will make steady progress as surgical pain decreases and be able to return home with his supportive wife. Currently recommending HHPT but if pt progresses quickly he may not need this at DC.    Follow Up Recommendations Home health PT;Supervision - Intermittent    Equipment Recommendations  Rolling walker with 5" wheels(vs rollator)    Recommendations for Other Services       Precautions / Restrictions Precautions Precautions: Fall Restrictions Weight Bearing Restrictions: No      Mobility  Bed Mobility Overal bed mobility: Needs Assistance Bed Mobility: Sidelying to Sit   Sidelying to sit: +2 for physical assistance;Mod assist       General bed mobility comments: Assist to bring legs off bed, elevate trunk into sitting, and bring hips to EOB  Transfers Overall transfer level: Needs assistance Equipment used: 4-wheeled walker Transfers: Sit to/from Stand Sit to Stand: +2 physical assistance;Mod assist         General transfer comment: Assist to bring hips up  Ambulation/Gait Ambulation/Gait assistance: +2 physical assistance;Min assist Ambulation Distance (Feet): 5 Feet Assistive device: 4-wheeled walker Gait Pattern/deviations: Step-to pattern;Decreased step length - right;Decreased step length - left;Shuffle;Trunk flexed Gait velocity: decr Gait velocity interpretation: Below normal speed for age/gender General Gait Details: Assist for balance and support. Frequent verbal/tactile cues to stay closer to walker and not push walker too far out in  front  Stairs            Wheelchair Mobility    Modified Rankin (Stroke Patients Only)       Balance Overall balance assessment: Needs assistance Sitting-balance support: Bilateral upper extremity supported;Feet supported Sitting balance-Leahy Scale: Poor Sitting balance - Comments: UE support   Standing balance support: Bilateral upper extremity supported Standing balance-Leahy Scale: Poor Standing balance comment: walker and min assist for static standing                             Pertinent Vitals/Pain Pain Assessment: Faces Faces Pain Scale: Hurts even more Pain Location: abdominal and groin incisions Pain Descriptors / Indicators: Grimacing;Operative site guarding Pain Intervention(s): Limited activity within patient's tolerance;Monitored during session;Premedicated before session;Repositioned    Home Living Family/patient expects to be discharged to:: Private residence Living Arrangements: Spouse/significant other Available Help at Discharge: Family;Available 24 hours/day Type of Home: House Home Access: Level entry     Home Layout: One level Home Equipment: Cane - single point      Prior Function Level of Independence: Independent               Hand Dominance        Extremity/Trunk Assessment   Upper Extremity Assessment Upper Extremity Assessment: Defer to OT evaluation    Lower Extremity Assessment Lower Extremity Assessment: Generalized weakness    Cervical / Trunk Assessment Cervical / Trunk Assessment: Other exceptions Cervical / Trunk Exceptions: Pt with slight forward flexion due to incisional guarding  Communication   Communication: No difficulties  Cognition Arousal/Alertness: Lethargic;Suspect due to medications Behavior [facility]: [facility] for tasks assessed/performed Overall Cognitive Status: Impaired/Different from  baseline Area of Impairment: Problem solving                              Problem Solving: Slow processing;Requires verbal cues;Requires tactile cues(likely due to pain meds) General Comments: Sleepy/lethargic due to meds but would awaken and able to participate      General Comments      Exercises     Assessment/Plan    PT Assessment Patient needs continued PT services  PT Problem List Decreased strength;Decreased balance;Decreased mobility;Decreased knowledge of use of DME;Pain       PT Treatment Interventions DME instruction;Gait training;Functional mobility training;Therapeutic activities;Therapeutic exercise;Balance training;Patient/family education    PT Goals (Current goals can be found in the Care Plan section)  Acute Rehab PT Goals Patient Stated Goal: return home PT Goal Formulation: With patient/family Time For Goal Achievement: 12/18/17 Potential to Achieve Goals: Good    Frequency Min 3X/week   Barriers to discharge        Co-evaluation PT/OT/SLP Co-Evaluation/Treatment: Yes Reason for Co-Treatment: For patient/therapist safety PT goals addressed during session: Mobility/safety with mobility;Proper use of DME         AM-PAC PT "6 Clicks" Daily Activity  Outcome Measure Difficulty turning over in bed (including adjusting bedclothes, sheets and blankets)?: Unable Difficulty moving from lying on back to sitting on the side of the bed? : Unable Difficulty sitting down on and standing up from a chair with arms (e.g., wheelchair, bedside commode, etc,.)?: Unable Help needed moving to and from a bed to chair (including a wheelchair)?: A Lot Help needed walking in hospital room?: A Lot Help needed climbing 3-5 steps with a railing? : Total 6 Click Score: 8    End of Session Equipment Utilized During Treatment: Oxygen Activity Tolerance: Patient tolerated treatment well Patient left: in chair;with call bell/phone within reach;with family/visitor present Nurse Communication: Mobility status PT Visit Diagnosis: Other abnormalities  of gait and mobility (R26.89);Muscle weakness (generalized) (M62.81);Pain Pain - part of body: (abdomen)    Time: 1610-9604 PT Time Calculation (min) (ACUTE ONLY): 42 min   Charges:   PT Evaluation $PT Eval Moderate Complexity: 1 Mod PT Treatments $Gait Training: 8-22 mins   PT G Codes:        [facility] PT [phone]   Angelina Ok [facility] 12/04/2017, 2:24 PM

## 2017-12-04 NOTE — Evaluation (Signed)
Occupational Therapy Evaluation Patient Details Name: Austin [name] MRN: [id number] DOB: [birth year] Today's Date: 12/04/2017    History of Present Illness Pt adm with lt great toe gangrene. Pt underwent aorto bifemoral bypass graft and aortic enarterectomy on 12/03/17. PMH - back surgery, HTN, bil THR   Clinical Impression   PTA Pt independent in ADL/IADL. Pt is currently max A limited by pain and decreased ROM (as anticipated post-op). Anticipate that Pt will make steady progress towards independence in ADL and functional transfers to return home with very supportive wife who is able to provide 24 hour support. Pt will benefit from skilled OT in the acute setting to maximize safety and independence in ADL and functional transfers along with education for DME and AE.     Follow Up Recommendations  Supervision/Assistance - 24 hour    Equipment Recommendations  3 in 1 bedside commode    Recommendations for Other Services       Precautions / Restrictions Precautions Precautions: Fall Restrictions Weight Bearing Restrictions: No      Mobility Bed Mobility Overal bed mobility: Needs Assistance Bed Mobility: Sidelying to Sit   Sidelying to sit: +2 for physical assistance;Mod assist       General bed mobility comments: Assist to bring legs off bed, elevate trunk into sitting, and bring hips to EOB  Transfers Overall transfer level: Needs assistance Equipment used: 4-wheeled walker Transfers: Sit to/from Stand Sit to Stand: +2 physical assistance;Mod assist         General transfer comment: Assist to bring hips up    Balance Overall balance assessment: Needs assistance Sitting-balance support: Bilateral upper extremity supported;Feet supported Sitting balance-Leahy Scale: Poor Sitting balance - Comments: UE support   Standing balance support: Bilateral upper extremity supported Standing balance-Leahy Scale: Poor Standing balance comment: walker and min assist for  static standing                           ADL either performed or assessed with clinical judgement   ADL Overall ADL's : Needs assistance/impaired Eating/Feeding: NPO   Grooming: Wash/dry face;Moderate assistance;Sitting Grooming Details (indicate cue type and reason): EOB - limited by A line and amount of chords - anticipate quick recovery Upper Body Bathing: Minimal assistance   Lower Body Bathing: Maximal assistance   Upper Body Dressing : Minimal assistance   Lower Body Dressing: Maximal assistance Lower Body Dressing Details (indicate cue type and reason): to don socks - limited ROM and pain from sx Toilet Transfer: Moderate assistance;+2 for physical assistance;+2 for safety/equipment;Ambulation;RW Toilet Transfer Details (indicate cue type and reason): rollator, simulated through recliner transfer Toileting- Clothing Manipulation and Hygiene: Maximal assistance;Sit to/from stand;+2 for safety/equipment       Functional mobility during ADLs: Moderate assistance;+2 for physical assistance;+2 for safety/equipment;Cueing for safety;Cueing for sequencing(Rollator)       Vision Patient Visual Report: No change from baseline       Perception     Praxis      Pertinent Vitals/Pain Pain Assessment: Faces Faces Pain Scale: Hurts even more Pain Location: abdominal and groin incisions Pain Descriptors / Indicators: Grimacing;Operative site guarding Pain Intervention(s): Limited activity within patient's tolerance;Monitored during session;Premedicated before session;Repositioned     Hand Dominance     Extremity/Trunk Assessment Upper Extremity Assessment Upper Extremity Assessment: Overall WFL for tasks assessed(limited by lines)   Lower Extremity Assessment Lower Extremity Assessment: Defer to PT evaluation   Cervical / Trunk Assessment Cervical / Trunk Assessment:  Other exceptions Cervical / Trunk Exceptions: Pt with slight forward flexion due to  incisional guarding   Communication Communication Communication: No difficulties   Cognition Arousal/Alertness: Lethargic;Suspect due to medications Behavior [facility] for tasks assessed/performed Overall Cognitive Status: Impaired/Different from baseline Area of Impairment: Problem solving                             Problem Solving: Slow processing;Requires verbal cues;Requires tactile cues(likely due to pain meds) General Comments: Sleepy/lethargic due to meds but would awaken and able to participate   General Comments  Pt's wife present throughout session    Exercises     Shoulder Instructions      Home Living Family/patient expects to be discharged to:: Private residence Living Arrangements: Spouse/significant other Available Help at Discharge: Family;Available 24 hours/day Type of Home: House Home Access: Level entry     Home Layout: One level     Bathroom Shower/Tub: Chief Strategy OfficerTub/shower unit   Bathroom Toilet: Standard     Home Equipment: Cane - single point          Prior Functioning/Environment Level of Independence: Independent                 OT Problem List: Decreased strength;Decreased range of motion;Decreased activity tolerance;Impaired balance (sitting and/or standing);Decreased cognition;Decreased safety awareness;Decreased knowledge of use of DME or AE;Decreased knowledge of precautions;Cardiopulmonary status limiting activity;Pain      OT Treatment/Interventions: Self-care/ADL training;Energy conservation;DME and/or AE instruction;Therapeutic activities;Patient/family education;Balance training    OT Goals(Current goals can be found in the care plan section) Acute Rehab OT Goals Patient Stated Goal: return home OT Goal Formulation: With patient/family Time For Goal Achievement: 12/18/17 Potential to Achieve Goals: Good ADL Goals Pt Will Perform Lower Body Bathing: with min guard assist;sit to/from stand;with adaptive  equipment;with caregiver independent in assisting Pt Will Perform Lower Body Dressing: with min guard assist;with caregiver independent in assisting;with adaptive equipment;sit to/from stand Pt Will Transfer to Toilet: with supervision;ambulating Pt Will Perform Toileting - Clothing Manipulation and hygiene: with min guard assist;with caregiver independent in assisting;sit to/from stand Additional ADL Goal #1: Pt will perform bed mobility at supervision level prior to engaging in ADL activity  OT Frequency: Min 2X/week   Barriers to D/C:            Co-evaluation PT/OT/SLP Co-Evaluation/Treatment: Yes Reason for Co-Treatment: For patient/therapist safety;Complexity of the patient's impairments (multi-system involvement) PT goals addressed during session: Mobility/safety with mobility;Proper use of DME OT goals addressed during session: ADL's and self-care;Proper use of Adaptive equipment and DME      AM-PAC PT "6 Clicks" Daily Activity     Outcome Measure Help from another person eating meals?: Total(NPO) Help from another person taking care of personal grooming?: A Lot Help from another person toileting, which includes using toliet, bedpan, or urinal?: A Lot Help from another person bathing (including washing, rinsing, drying)?: A Lot Help from another person to put on and taking off regular upper body clothing?: A Lot Help from another person to put on and taking off regular lower body clothing?: A Lot 6 Click Score: 11   End of Session Equipment Utilized During Treatment: Rolling walker;Oxygen Nurse Communication: Mobility status;Other (comment)(IV infusion complete)  Activity Tolerance: Patient tolerated treatment well Patient left: in chair;with call bell/phone within reach;with family/visitor present  OT Visit Diagnosis: Unsteadiness on feet (R26.81);Other abnormalities of gait and mobility (R26.89);Pain Pain - Right/Left: (bilateral) Pain - part of  body: Leg                 Time: 1123-1205 OT Time Calculation (min): 42 min Charges:  OT General Charges $OT Visit: 1 Visit OT Evaluation $OT Eval Moderate Complexity: 1 Mod G-Codes:     Sherryl Manges OTR/L [phone]  Austin [name] Austin [name] 12/04/2017, 3:09 PM

## 2017-12-04 NOTE — Progress Notes (Signed)
0015: Difficulty controlling BP within parameters on IV cleviprex and with PRNs. Paged on call [facility], Dr. Darrick PennaFields, who asked gtt be paused for 20 min and call with BP after this period of time.   0025: BP increased to 200s/60s (MAP 80s-90s) within 10 min of cleviprex gtt being paused. RN restarted gtt immediately. MD paged.  0038: MD returned call with new BP parameters: keep SBP between 100-170.

## 2017-12-04 NOTE — Progress Notes (Signed)
Pt seen and examined Post Aortobifemoral bypass graft in ICU Per [facility], will follow peripherally  Zannie CovePreetha Oluwaferanmi Wain, MD

## 2017-12-04 NOTE — Progress Notes (Addendum)
  AAA Progress Note    12/04/2017 7:39 AM 1 Day Post-Op  Subjective:  C/o pain  Afebrile HR 70's-80's 120's-150's systolic 91% 6LO2NC  Gtts: Clevidipine  Vitals:   12/04/17 0600 12/04/17 0700  BP: 123/62 (!) 148/73  Pulse: 84 81  Resp: 16 16  Temp:    SpO2: 92% 91%    Physical Exam: Cardiac:  regular Lungs:  Non labored  Abdomen:  distended Incisions:  Midline incision is clean and dry; pravena vacs are with good seal bilateral groins Extremities: +doppler signal left AT and faint PT; and brisk doppler signal right DP/PT  CBC    Component Value Date/Time   WBC 13.6 (H) 12/04/2017 0315   RBC 3.64 (L) 12/04/2017 0315   HGB 10.5 (L) 12/04/2017 0315   HCT 32.5 (L) 12/04/2017 0315   PLT 267 12/04/2017 0315   MCV 89.3 12/04/2017 0315   MCH 28.8 12/04/2017 0315   MCHC 32.3 12/04/2017 0315   RDW 14.1 12/04/2017 0315   LYMPHSABS 1.5 12/02/2017 0711   MONOABS 0.8 12/02/2017 0711   EOSABS 0.2 12/02/2017 0711   BASOSABS 0.1 12/02/2017 0711    BMET    Component Value Date/Time   NA 136 12/04/2017 0315   K 4.2 12/04/2017 0315   CL 105 12/04/2017 0315   CO2 21 (L) 12/04/2017 0315   GLUCOSE 158 (H) 12/04/2017 0315   BUN 14 12/04/2017 0315   CREATININE 1.17 12/04/2017 0315   CALCIUM 7.8 (L) 12/04/2017 0315   GFRNONAA >60 12/04/2017 0315   GFRAA >60 12/04/2017 0315    INR    Component Value Date/Time   INR 1.17 12/03/2017 1445     Intake/Output Summary (Last 24 hours) at 12/04/2017 0739 Last data filed at 12/04/2017 0700 Gross per 24 hour  Intake 6570.11 ml  Output 2135 ml  Net 4435.11 ml     Assessment/Plan:  67 y.o. male is s/p  Procedure:   #1: Aortobifemoral bypass graft using a 14 x 7 bifurcated [place] graft                         #2: Reimplantation of the inferior mesenteric artery                         #3: Bilateral femoral Praveena wound VAC                         #4: Aortic endarterectomy 1 Day Post-Op   -pt doing well post  operatively-Faint doppler signal left DP and monophasic doppler signal right DP -pO2 on ABG 43; O2 sats 91% on 6LO2NC--IS every hour and mobilize -NGT with minimal output-abdomen still somewhat distended-will keep one more day -hypertensive requiring Clevidipine-he did have a couple of short runs of VT with stimulation (7-10 beat run) will start scheduled lopressor 2.5mg  IV q6h to help wean -PCA-pain still uncontrolled - will add Toradol for 2 days as well as Ativan to help with muscle spasms -abx discontinued on transfer-will restart -start mobilizing today and out of bed -Pravena's with good seal-keep in place for about a week. -keep in ICU today -continue npo   Doreatha MassedSamantha Rhyne, PA-C [facility] [phone] 12/04/2017 7:39 AM   Agree with the above Continue abx  Durene CalWells Brabham

## 2017-12-04 NOTE — Progress Notes (Signed)
Pharmacy Antibiotic Note  Austin [name] is a 67 y.o. male  with cellulitis. Austin [name] is noted s/p aortobifemoral bypass graft on 3/21.  [name] started antibiotics (rocephin and vancomycin) on 3/13 and this was discontinued post procedure 3/20.  Spoke with vascular on 3/21 and to continue on vancomycin.  -previous dosing was 1250mg  IV q12h (last dose 3/20 at 11pm) -SCr= 1.17, CrCL ~ 70  Plan: -Restart vancomycin 1250mg  IV q12h -Will follow renal function, cultures and clinical progress   Height: 5\' 7"  (170.2 cm) Weight: 204 lb (92.5 kg)(Patient reported) IBW/kg (Calculated) : 66.1  Temp (24hrs), Avg:98.4 F (36.9 C), Min:97.6 F (36.4 C), Max:98.6 F (37 C)  Recent Labs  Lab 11/29/17 1221  12/01/17 0404 12/02/17 0711 12/03/17 0514 12/03/17 1445 12/04/17 0315  WBC  --   --   --  7.7 8.4 14.2* 13.6*  CREATININE  --    < > 1.03 0.95 0.91 1.14 1.17  VANCOTROUGH 8*  --   --   --   --   --   --    < > = values in this interval not displayed.    Estimated Creatinine Clearance: 68.3 mL/min (by C-G formula based on SCr of 1.17 mg/dL).    No Known Allergies   Thank you for allowing pharmacy to be a part of this patient's care.  Austin [name], PharmD Clinical Pharmacist Clinical phone from 8:30-4:00 is [phone] After 4pm, please call Main Rx [phone]) for assistance. 12/04/2017 10:49 AM

## 2017-12-05 ENCOUNTER — Encounter (HOSPITAL_COMMUNITY): Payer: Self-pay | Admitting: General Practice

## 2017-12-05 LAB — GLUCOSE, CAPILLARY: GLUCOSE-CAPILLARY: 110 mg/dL — AB (ref 65–99)

## 2017-12-05 MED ORDER — PANTOPRAZOLE SODIUM 40 MG IV SOLR
40.0000 mg | INTRAVENOUS | Status: DC
  Administered 2017-12-05 – 2017-12-06 (×2): 40 mg via INTRAVENOUS
  Filled 2017-12-05 (×2): qty 40

## 2017-12-05 MED ORDER — DOCUSATE SODIUM 50 MG/5ML PO LIQD
100.0000 mg | Freq: Every day | ORAL | Status: DC
  Administered 2017-12-06: 100 mg
  Filled 2017-12-05 (×3): qty 10

## 2017-12-05 MED ORDER — HYDRALAZINE HCL 20 MG/ML IJ SOLN
10.0000 mg | Freq: Four times a day (QID) | INTRAMUSCULAR | Status: DC | PRN
  Administered 2017-12-05 – 2017-12-06 (×2): 10 mg via INTRAVENOUS
  Filled 2017-12-05 (×3): qty 1

## 2017-12-05 MED ORDER — LABETALOL HCL 5 MG/ML IV SOLN
10.0000 mg | INTRAVENOUS | Status: DC | PRN
  Administered 2017-12-05 – 2017-12-08 (×4): 10 mg via INTRAVENOUS
  Filled 2017-12-05 (×4): qty 4

## 2017-12-05 MED ORDER — SODIUM CHLORIDE 0.9 % IV SOLN
INTRAVENOUS | Status: DC
  Administered 2017-12-05: 50 mL via INTRAVENOUS
  Administered 2017-12-05: 09:00:00 via INTRAVENOUS

## 2017-12-05 NOTE — Progress Notes (Signed)
Patient refused to have his foley catheter removed while awake. Patient requested for his foley catheter to be removed while he is asleep.

## 2017-12-05 NOTE — Progress Notes (Signed)
Physical Therapy Treatment Patient Details Name: Austin [name] MRN: [id number] DOB: [birth year] Today's Date: 12/05/2017    History of Present Illness Pt adm with lt great toe gangrene. Pt underwent aorto bifemoral bypass graft and aortic enarterectomy on 12/03/17. PMH - back surgery, HTN, bil THR    PT Comments    Pt making steady progress with mobility.    Follow Up Recommendations  Home health PT;Supervision - Intermittent     Equipment Recommendations  Rolling walker with 5" wheels    Recommendations for Other Services       Precautions / Restrictions Precautions Precautions: Fall Restrictions Weight Bearing Restrictions: No    Mobility  Bed Mobility Overal bed mobility: Needs Assistance Bed Mobility: Sidelying to Sit   Sidelying to sit: Mod assist;+2 for safety/equipment;HOB elevated       General bed mobility comments: Assist to bring legs off bed, elevate trunk into sitting  Transfers Overall transfer level: Needs assistance Equipment used: Rolling walker (2 wheeled) Transfers: Sit to/from Stand Sit to Stand: Min assist;+2 safety/equipment;From elevated surface         General transfer comment: Assist to bring hips up and verbal cues for hand placment  Ambulation/Gait Ambulation/Gait assistance: Min assist;+2 safety/equipment Ambulation Distance (Feet): 150 Feet Assistive device: Rolling walker (2 wheeled) Gait Pattern/deviations: Trunk flexed;Step-through pattern;Decreased stride length;Wide base of support Gait velocity: decr Gait velocity interpretation: Below normal speed for age/gender General Gait Details: assist for safety. Frequent verbal cues to stand more erect. Amb on RA with SpO2>98%   Stairs            Wheelchair Mobility    Modified Rankin (Stroke Patients Only)       Balance Overall balance assessment: Needs assistance Sitting-balance support: Bilateral upper extremity supported;Feet supported Sitting balance-Leahy Scale:  Poor Sitting balance - Comments: UE support   Standing balance support: Bilateral upper extremity supported Standing balance-Leahy Scale: Poor Standing balance comment: walker and min guard assist for static standing                            Cognition Arousal/Alertness: Awake/alert Behavior During Therapy: WFL for tasks assessed/performed Overall Cognitive Status: Within Functional Limits for tasks assessed                                        Exercises      General Comments        Pertinent Vitals/Pain Pain Assessment: Faces Faces Pain Scale: Hurts little more Pain Location: abdominal and groin incisions with movement to EOB Pain Descriptors / Indicators: Grimacing;Operative site guarding Pain Intervention(s): Limited activity within patient's tolerance;Monitored during session    Home Living                      Prior Function            PT Goals (current goals can now be found in the care plan section) Progress towards PT goals: Progressing toward goals    Frequency    Min 3X/week      PT Plan Current plan remains appropriate    Co-evaluation PT/OT/SLP Co-Evaluation/Treatment: Yes            AM-PAC PT "6 Clicks" Daily Activity  Outcome Measure  Difficulty turning over in bed (including adjusting bedclothes, sheets and blankets)?: Unable Difficulty moving from lying on back  to sitting on the side of the bed? : Unable Difficulty sitting down on and standing up from a chair with arms (e.g., wheelchair, bedside commode, etc,.)?: Unable Help needed moving to and from a bed to chair (including a wheelchair)?: A Lot Help needed walking in hospital room?: A Little Help needed climbing 3-5 steps with a railing? : Total 6 Click Score: 9    End of Session Equipment Utilized During Treatment: Gait belt Activity Tolerance: Patient tolerated treatment well Patient left: in chair;with call bell/phone within reach;with  family/visitor present Nurse Communication: Mobility status PT Visit Diagnosis: Other abnormalities of gait and mobility (R26.89);Muscle weakness (generalized) (M62.81);Pain Pain - part of body: (abdomen)     Time: [id number] PT Time Calculation (min) (ACUTE ONLY): 27 min  Charges:  $Gait Training: 23-37 mins                    G Codes:       [facility] Center LtdCary Kamarah Bilotta PT [phone]    Angelina OkCary W [facility] LLCMaycok 12/05/2017, 4:13 PM

## 2017-12-05 NOTE — Progress Notes (Addendum)
Subjective  - POD #2, s/p ABF  Some flatus? Pain ok OOB to chair yesterday   Physical Exam:  Biphasic PT signals abd more soft today       Assessment/Plan:  POD #2  Pulm:  Extubated, stable CV: stable signals in feet, d/c aline ID:  Abx for ischemic left great toe with dry gangreene GI:  Minimal NG output, d/c NG. ? Clears in am GU:cr stable, good UOP, d/c foley, decrease IVF Acute blood loss anemia: stable.  No transfusions to date. Hb 10.5 today Prophylaxis:  lovenox and protonix  Wells Brabham 12/05/2017 7:49 AM --  Vitals:   12/05/17 0500 12/05/17 0600  BP: (!) 167/74 (!) 155/71  Pulse: 83 81  Resp: 10 (!) 9  Temp:    SpO2: 100% 100%    Intake/Output Summary (Last 24 hours) at 12/05/2017 0749 Last data filed at 12/05/2017 0600 Gross per 24 hour  Intake 3372.5 ml  Output 1235 ml  Net 2137.5 ml     Laboratory CBC    Component Value Date/Time   WBC 13.6 (H) 12/04/2017 0315   HGB 10.5 (L) 12/04/2017 0315   HCT 32.5 (L) 12/04/2017 0315   PLT 267 12/04/2017 0315    BMET    Component Value Date/Time   NA 136 12/04/2017 0315   K 4.2 12/04/2017 0315   CL 105 12/04/2017 0315   CO2 21 (L) 12/04/2017 0315   GLUCOSE 158 (H) 12/04/2017 0315   BUN 14 12/04/2017 0315   CREATININE 1.17 12/04/2017 0315   CALCIUM 7.8 (L) 12/04/2017 0315   GFRNONAA >60 12/04/2017 0315   GFRAA >60 12/04/2017 0315    COAG Lab Results  Component Value Date   INR 1.17 12/03/2017   INR 1.03 12/01/2017   No results found for: PTT  Antibiotics Anti-infectives (From admission, onward)   Start     Dose/Rate Route Frequency Ordered Stop   12/04/17 2000  vancomycin (VANCOCIN) 1,250 mg in sodium chloride 0.9 % 250 mL IVPB     1,250 mg 166.7 mL/hr over 90 Minutes Intravenous Every 12 hours 12/04/17 1924     12/04/17 1100  cefTRIAXone (ROCEPHIN) 2 g in sodium chloride 0.9 % 100 mL IVPB     2 g 200 mL/hr over 30 Minutes Intravenous Every 24 hours 12/04/17 1011     12/03/17 1900  ceFAZolin (ANCEF) IVPB 2g/100 mL premix     2 g 200 mL/hr over 30 Minutes Intravenous Every 8 hours 12/03/17 1805 12/04/17 0341   11/29/17 2330  vancomycin (VANCOCIN) 1,250 mg in sodium chloride 0.9 % 250 mL IVPB  Status:  Discontinued     1,250 mg 166.7 mL/hr over 90 Minutes Intravenous Every 12 hours 11/29/17 1419 12/03/17 1805   11/27/17 1130  vancomycin (VANCOCIN) IVPB 1000 mg/200 mL premix  Status:  Discontinued     1,000 mg 200 mL/hr over 60 Minutes Intravenous Every 12 hours 11/26/17 2239 11/29/17 1419   11/27/17 1030  cefTRIAXone (ROCEPHIN) 2 g in sodium chloride 0.9 % 100 mL IVPB  Status:  Discontinued     2 g 200 mL/hr over 30 Minutes Intravenous Every 24 hours 11/27/17 0935 12/03/17 1805   11/27/17 0930  cefTRIAXone (ROCEPHIN) injection 2 g  Status:  Discontinued     2 g Intramuscular Every 24 hours 11/27/17 0929 11/27/17 0933   11/26/17 2300  vancomycin (VANCOCIN) 2,000 mg in sodium chloride 0.9 % 500 mL IVPB     2,000 mg 250 mL/hr over 120  Minutes Intravenous  Once 11/26/17 2239 11/27/17 0411   11/26/17 2130  vancomycin (VANCOCIN) 2,000 mg in sodium chloride 0.9 % 500 mL IVPB  Status:  Discontinued     2,000 mg 250 mL/hr over 120 Minutes Intravenous  Once 11/26/17 2108 11/26/17 2128   11/26/17 2115  piperacillin-tazobactam (ZOSYN) IVPB 3.375 g  Status:  Discontinued     3.375 g 100 mL/hr over 30 Minutes Intravenous  Once 11/26/17 2108 11/26/17 2128       V. Charlena CrossWells Brabham IV, M.D. [facility] [place] Office: [phone] Pager:  [phone]

## 2017-12-06 LAB — TYPE AND SCREEN
ABO/RH(D): A POS
Antibody Screen: NEGATIVE
Unit division: 0
Unit division: 0

## 2017-12-06 LAB — BPAM RBC
BLOOD PRODUCT EXPIRATION DATE: 201903302359
Blood Product Expiration Date: 201903272359
ISSUE DATE / TIME: 201903200851
ISSUE DATE / TIME: 201903200851
Unit Type and Rh: 6200
Unit Type and Rh: 6200

## 2017-12-06 LAB — BASIC METABOLIC PANEL
Anion gap: 9 (ref 5–15)
BUN: 21 mg/dL — AB (ref 6–20)
CHLORIDE: 107 mmol/L (ref 101–111)
CO2: 22 mmol/L (ref 22–32)
CREATININE: 1.14 mg/dL (ref 0.61–1.24)
Calcium: 7.6 mg/dL — ABNORMAL LOW (ref 8.9–10.3)
GFR calc Af Amer: >60 mL/min (ref >60)
GFR calc non Af Amer: >60 mL/min (ref >60)
Glucose, Bld: 93 mg/dL (ref 65–99)
POTASSIUM: 4 mmol/L (ref 3.5–5.1)
Sodium: 138 mmol/L (ref 135–145)

## 2017-12-06 LAB — CBC
HEMATOCRIT: 28.2 % — AB (ref 39.0–52.0)
HEMOGLOBIN: 9.3 g/dL — AB (ref 13.0–17.0)
MCH: 29.5 pg (ref 26.0–34.0)
MCHC: 33 g/dL (ref 30.0–36.0)
MCV: 89.5 fL (ref 78.0–100.0)
Platelets: 214 10*3/uL (ref 150–400)
RBC: 3.15 MIL/uL — AB (ref 4.22–5.81)
RDW: 14.4 % (ref 11.5–15.5)
WBC: 11.7 10*3/uL — ABNORMAL HIGH (ref 4.0–10.5)

## 2017-12-06 MED ORDER — PANTOPRAZOLE SODIUM 40 MG PO TBEC
40.0000 mg | DELAYED_RELEASE_TABLET | Freq: Every day | ORAL | Status: DC
  Administered 2017-12-06 – 2017-12-10 (×5): 40 mg via ORAL
  Filled 2017-12-06 (×5): qty 1

## 2017-12-06 MED ORDER — METOPROLOL TARTRATE 25 MG PO TABS
25.0000 mg | ORAL_TABLET | Freq: Two times a day (BID) | ORAL | Status: DC
  Administered 2017-12-06: 25 mg via ORAL
  Filled 2017-12-06: qty 1

## 2017-12-06 MED ORDER — OXYCODONE-ACETAMINOPHEN 5-325 MG PO TABS
1.0000 | ORAL_TABLET | Freq: Four times a day (QID) | ORAL | Status: DC | PRN
  Administered 2017-12-06 – 2017-12-09 (×4): 2 via ORAL
  Filled 2017-12-06 (×4): qty 2

## 2017-12-06 MED ORDER — METOPROLOL SUCCINATE ER 25 MG PO TB24
12.5000 mg | ORAL_TABLET | Freq: Every day | ORAL | Status: DC
  Administered 2017-12-06: 12.5 mg via ORAL
  Filled 2017-12-06: qty 1

## 2017-12-06 NOTE — Progress Notes (Signed)
Pt received from 2H. Telemetry applied. Pt oriented to unit. Dinner tray rerouted. VSS. Call light in reach. Will continue to monitor.  [name]  Alden Bensinger, RN

## 2017-12-06 NOTE — Progress Notes (Signed)
17cc of morphine from PCA pump wasted with Gillis EndsMillie Shaw RN BSN

## 2017-12-06 NOTE — Progress Notes (Signed)
PROGRESS NOTE    Austin [name]  RUE:[id number] DOB: [birth year] DOA: 11/26/2017 PCP: Patient, No Pcp Per  Brief Narrative:67 year old male with no past medical history, lifelong smoker did not see a physician regularly presented to the emergency room with black discoloration of the left great toe with progressive swelling erythema and tenderness of the left foot. -and he was found to have severe critical limb ischemia, evaluated by vascular surgery and felt need aorto bifemoral bypass graft. -through his hospital stay he was found to have an abnormal echocardiogram with poor motion abnormality, subsequently underwent a stress test which was abnormal followed by a heart catheterization on 3/18 with surprisingly showed, normal coronaries with no significant CAD. 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 Stop Abx -defer to VVS -per VVS  -OOB, PT eval -NGT out, diet being advanced  Hypertension -change IV metop to PO -hydralazine PRN  Suspected COPD/heavy smoker -Stable, nebs when necessary  Abnormal stress test -No significant CAD noted on cardiac cath 3/18 -Continue aspirin and statin beta blocker  DVT prophylaxis: lovenox Code Status: Full code Family Communication: family at bedside Disposition Plan: SNF MOnday  Consultants:  VVS Cards   Procedures:   Left heart cath 3/18  Aortibifem bypass 3/20  Antimicrobials:    Subjective: -feels well, NG out, started clears  Objective: Vitals:   12/06/17 1100 12/06/17 1200 12/06/17 1213 12/06/17 1300  BP: (!) 159/60 (!) 174/86  (!) 182/76  Pulse:  72 70  (!) 140  Resp: 13 13  18   Temp:   98.6 F (37 C)   TempSrc:   Oral   SpO2: 100% 100%  100%  Weight:      Height:        Intake/Output Summary (Last 24 hours) at 12/06/2017 1350 Last data filed at 12/06/2017 1226 Gross per 24 hour  Intake 2296.66 ml  Output 1250 ml  Net 1046.66 ml   Filed Weights   11/28/17 1059 12/06/17 0600  Weight: 92.5 kg (204 lb) 93.5 kg (206 lb 2.1 oz)    Examination:  Gen: Awake, Alert, Oriented X 3, no distress HEENT: PERRLA, Neck supple, no JVD, Central line + Lungs: CTAB CVS: RRR,No Gallops,Rubs or new Murmurs Abd: soft, Non tender, non distended, increased BS, Abd incision c/d/i Extremities: 1 plus edema, L great toe with dry gangreneSkin: no new rashes Psychiatry: Judgement and insight appear normal. Mood & affect appropriate.     Data Reviewed:   CBC: Recent Labs  Lab 12/02/17 0711 12/03/17 0514  12/03/17 1226 12/03/17 1445 12/04/17 0029 12/04/17 0315 12/06/17 0421  WBC 7.7 8.4  --   --  14.2*  --  13.6* 11.7*  NEUTROABS 5.1  --   --   --   --   --   --   --   HGB 11.7* 11.9*   < > 9.9* 10.8* 11.6* 10.5* 9.3*  HCT 36.0* 37.4*   < > 29.0* 33.5* 34.0* 32.5* 28.2*  MCV 91.1 91.2  --   --  90.5  --  89.3 89.5  PLT 290 291  --   --  248  --  267 214   < > =  values in this interval not displayed.   Basic Metabolic Panel: Recent Labs  Lab 12/02/17 0711 12/03/17 0514  12/03/17 1226 12/03/17 1445 12/04/17 0029 12/04/17 0315 12/06/17 0421  NA 138 137   < > 140 139 140 136 138  K 3.9 4.4   < > 4.3 4.1 4.2 4.2 4.0  CL 107 104  --   --  108 105 105 107  CO2 22 22  --   --  20*  --  21* 22  GLUCOSE 115* 87  --   --  156* 166* 158* 93  BUN 13 13  --   --  12 15 14  21*  CREATININE 0.95 0.91  --   --  1.14 0.90 1.17 1.14  CALCIUM 8.6* 8.6*  --   --  7.9*  --  7.8* 7.6*  MG  --   --   --   --  1.7  --  1.9  --    < > = values in this interval not displayed.   GFR: Estimated Creatinine Clearance: 70.4 mL/min (by C-G formula  based on SCr of 1.14 mg/dL). Liver Function Tests: Recent Labs  Lab 12/04/17 0315  AST 45*  ALT 44  ALKPHOS 53  BILITOT 0.2*  PROT 5.8*  ALBUMIN 2.8*   Recent Labs  Lab 12/04/17 0315  AMYLASE 48   No results for input(s): AMMONIA in the last 168 hours. Coagulation Profile: Recent Labs  Lab 12/01/17 1118 12/03/17 1445  INR 1.03 1.17   Cardiac Enzymes: No results for input(s): CKTOTAL, CKMB, CKMBINDEX, TROPONINI in the last 168 hours. BNP (last 3 results) No results for input(s): PROBNP in the last 8760 hours. HbA1C: Recent Labs    12/03/17 1500  HGBA1C 5.6   CBG: Recent Labs  Lab 12/05/17 2012  GLUCAP 110*   Lipid Profile: No results for input(s): CHOL, HDL, LDLCALC, TRIG, CHOLHDL, LDLDIRECT in the last 72 hours. Thyroid Function Tests: No results for input(s): TSH, T4TOTAL, FREET4, T3FREE, THYROIDAB in the last 72 hours. Anemia Panel: No results for input(s): VITAMINB12, FOLATE, FERRITIN, TIBC, IRON, RETICCTPCT in the last 72 hours. Urine analysis:    Component Value Date/Time   COLORURINE YELLOW 11/26/2017 2136   APPEARANCEUR CLEAR 11/26/2017 2136   LABSPEC 1.024 11/26/2017 2136   PHURINE 6.0 11/26/2017 2136   GLUCOSEU NEGATIVE 11/26/2017 2136   HGBUR NEGATIVE 11/26/2017 2136   BILIRUBINUR NEGATIVE 11/26/2017 2136   KETONESUR NEGATIVE 11/26/2017 2136   PROTEINUR NEGATIVE 11/26/2017 2136   NITRITE NEGATIVE 11/26/2017 2136   LEUKOCYTESUR NEGATIVE 11/26/2017 2136   Sepsis Labs: @LABRCNTIP (procalcitonin:4,lacticidven:4)  ) Recent Results (from the past 240 hour(s))  Blood culture (routine x 2)     Status: None   Collection Time: 11/26/17  5:38 PM  Result Value Ref Range Status   Specimen Description BLOOD LEFT ANTECUBITAL  Final   Special Requests   Final    BOTTLES DRAWN AEROBIC AND ANAEROBIC Blood Culture adequate volume   Culture   Final    NO GROWTH 5 DAYS Performed at [facility], [address] [place], [place] [address]     Report Status 12/01/2017 FINAL  Final  Blood culture (routine x 2)     Status: None   Collection Time: 11/26/17  9:29 PM  Result Value Ref Range Status   Specimen Description BLOOD LEFT ANTECUBITAL  Final   Special Requests   Final    BOTTLES DRAWN AEROBIC AND ANAEROBIC Blood Culture adequate volume  Culture   Final    NO GROWTH 5 DAYS Performed at [facility], [address] [place], [place] [address]    Report Status 12/01/2017 FINAL  Final  Surgical PCR screen     Status: Abnormal   Collection Time: 12/02/17 11:42 AM  Result Value Ref Range Status   MRSA, PCR POSITIVE (A) NEGATIVE Final    Comment: RESULT CALLED TO, READ BACK BY AND VERIFIED WITH: Dione PloverB. Grace RN 13:25 12/02/17 ([email or web address])    Staphylococcus aureus POSITIVE (A) NEGATIVE Final    Comment: (NOTE) The Xpert SA Assay (FDA approved for NASAL specimens in patients 65 years of age and older), is one component of a comprehensive surveillance program. It is not intended to diagnose infection nor to guide or monitor treatment. Performed at [facility], [address] [place], [place] [address]          Radiology Studies: No results found.      Scheduled Meds: . Chlorhexidine Gluconate Cloth  6 each Topical Q0600  . docusate  100 mg Per Tube Daily  . enoxaparin (LOVENOX) injection  40 mg Subcutaneous Q24H  . metoprolol tartrate  2.5 mg Intravenous Q6H  . morphine   Intravenous Q4H  . mupirocin ointment  1 application Nasal BID  . pantoprazole (PROTONIX) IV  40 mg Intravenous Q24H  . sodium chloride flush  10-40 mL Intracatheter Q12H   Continuous Infusions: . sodium chloride    . sodium chloride 50 mL/hr at 12/06/17 0600  . cefTRIAXone (ROCEPHIN)  IV Stopped (12/06/17 1316)  . clevidipine Stopped (12/05/17 1433)  . magnesium sulfate 1 - 4 g bolus IVPB    . vancomycin Stopped (12/06/17 1103)     LOS: 10 days    Time spent: 25min    Zannie CovePreetha Jan Walters, MD [facility] Page via  [email or web address], password [email or web address] After 7PM please contact night-coverage  12/06/2017, 1:50 PM

## 2017-12-06 NOTE — Progress Notes (Signed)
[facility] [place]  Subjective  - wants a steak   Objective (!) 152/72 74 98.7 F (37.1 C) (Oral) 17 100%  Intake/Output Summary (Last 24 hours) at 12/06/2017 1037 Last data filed at 12/06/2017 [id number] Gross per 24 hour  Intake 2096.66 ml  Output 1850 ml  Net 246.66 ml   Abdomen incision clean Groins with provena Feet pink warm + flatus  Assessment/Planning: Doing well post ABF Transfer to 4E Clear liquids today Possible d/c Monday  Fabienne BrunsCharles Fizza Scales 12/06/2017 10:37 AM --  Laboratory Lab Results: Recent Labs    12/04/17 0315 12/06/17 0421  WBC 13.6* 11.7*  HGB 10.5* 9.3*  HCT 32.5* 28.2*  PLT 267 214   BMET Recent Labs    12/04/17 0315 12/06/17 0421  NA 136 138  K 4.2 4.0  CL 105 107  CO2 21* 22  GLUCOSE 158* 93  BUN 14 21*  CREATININE 1.17 1.14  CALCIUM 7.8* 7.6*    COAG Lab Results  Component Value Date   INR 1.17 12/03/2017   INR 1.03 12/01/2017   No results found for: PTT

## 2017-12-07 ENCOUNTER — Inpatient Hospital Stay (HOSPITAL_COMMUNITY): Payer: Medicare Other

## 2017-12-07 DIAGNOSIS — M79609 Pain in unspecified limb: Secondary | ICD-10-CM

## 2017-12-07 DIAGNOSIS — M7989 Other specified soft tissue disorders: Secondary | ICD-10-CM

## 2017-12-07 DIAGNOSIS — R609 Edema, unspecified: Secondary | ICD-10-CM

## 2017-12-07 LAB — VANCOMYCIN, TROUGH: Vancomycin Tr: 18 ug/mL (ref 15–20)

## 2017-12-07 MED ORDER — METOPROLOL SUCCINATE ER 25 MG PO TB24
25.0000 mg | ORAL_TABLET | Freq: Every day | ORAL | Status: DC
  Administered 2017-12-07 – 2017-12-10 (×4): 25 mg via ORAL
  Filled 2017-12-07 (×4): qty 1

## 2017-12-07 NOTE — Progress Notes (Addendum)
Pharmacy Antibiotic Note  Austin DurandStephen [name] is a 67 y.o. male  with cellulitis.  He is noted s/p aortobifemoral bypass graft on 3/21.  He started antibiotics (rocephin and vancomycin) on 3/13 and this was discontinued post procedure 3/20.  Spoke with vascular on 3/21 and to continue on vancomycin.  Scr remains stable at 1.14. WBC on last check 11.7. VT was drawn 9.5 hours after last dose and came back at 18 - likely closer to 15 at true trough. On day #11 of vancomycin therapy- ceftriaxone discontinued on 3/23.  Plan: -Continue vancomycin 1250mg  IV q12h -Will follow renal function, cultures and clinical progress -Will follow along with team to assess duration of therapy   Height: 5\' 7"  (170.2 cm) Weight: 206 lb 2.1 oz (93.5 kg) IBW/kg (Calculated) : 66.1  Temp (24hrs), Avg:98.5 F (36.9 C), Min:98.1 F (36.7 C), Max:98.9 F (37.2 C)  Recent Labs  Lab 12/02/17 0711 12/03/17 0514 12/03/17 1445 12/04/17 0029 12/04/17 0315 12/06/17 0421 12/07/17 0805  WBC 7.7 8.4 14.2*  --  13.6* 11.7*  --   CREATININE 0.95 0.91 1.14 0.90 1.17 1.14  --   VANCOTROUGH  --   --   --   --   --   --  18    Estimated Creatinine Clearance: 70.4 mL/min (by C-G formula based on SCr of 1.14 mg/dL).    No Known Allergies   Thank you for allowing pharmacy to be a part of this patient's care.  Girard CooterKimberly Perkins, PharmD Clinical Pharmacist  Pager: [phone] Clinical Phone for 12/07/2017 until 3:30pm: [phone] If after 3:30pm, please call main pharmacy at [phone] 12/07/2017 12:10 PM

## 2017-12-07 NOTE — Progress Notes (Signed)
VASCULAR LAB PRELIMINARY  PRELIMINARY  PRELIMINARY  PRELIMINARY  Left lower extremity venous duplex completed.    Preliminary report:  There is no DVT or SVT noted in the left lower extremity.   Mia Milan, RVT 12/07/2017, 9:26 AM

## 2017-12-07 NOTE — Progress Notes (Addendum)
  Progress Note    12/07/2017 7:59 AM 4 Days Post-Op  Subjective:  Wants to eat; says he has had a couple of bowel movements and walked some.  Says he is getting some warmth back in his toe.  Afebrile HR 50's-80's NSR 150's-180's systolic 95% RA  Vitals:   12/06/17 2207 12/07/17 0419  BP: (!) 155/79 (!) 174/81  Pulse: 78 69  Resp: 18 13  Temp: 98.2 F (36.8 C) 98.5 F (36.9 C)  SpO2: 98% 95%    Physical Exam: Cardiac:  regular Lungs:  Non labored Incisions:  Bilateral groins with Pravena vacs in place; laparotomy incision is clean and dry Extremities:  +palpable bilateral DP pulses; BLE with left>right; no pain to palpation; no erythema    Abdomen:  Soft, NT/ND; +BM  CBC    Component Value Date/Time   WBC 11.7 (H) 12/06/2017 0421   RBC 3.15 (L) 12/06/2017 0421   HGB 9.3 (L) 12/06/2017 0421   HCT 28.2 (L) 12/06/2017 0421   PLT 214 12/06/2017 0421   MCV 89.5 12/06/2017 0421   MCH 29.5 12/06/2017 0421   MCHC 33.0 12/06/2017 0421   RDW 14.4 12/06/2017 0421   LYMPHSABS 1.5 12/02/2017 0711   MONOABS 0.8 12/02/2017 0711   EOSABS 0.2 12/02/2017 0711   BASOSABS 0.1 12/02/2017 0711    BMET    Component Value Date/Time   NA 138 12/06/2017 0421   K 4.0 12/06/2017 0421   CL 107 12/06/2017 0421   CO2 22 12/06/2017 0421   GLUCOSE 93 12/06/2017 0421   BUN 21 (H) 12/06/2017 0421   CREATININE 1.14 12/06/2017 0421   CALCIUM 7.6 (L) 12/06/2017 0421   GFRNONAA >60 12/06/2017 0421   GFRAA >60 12/06/2017 0421    INR    Component Value Date/Time   INR 1.17 12/03/2017 1445     Intake/Output Summary (Last 24 hours) at 12/07/2017 0759 Last data filed at 12/07/2017 0422 Gross per 24 hour  Intake 570 ml  Output 850 ml  Net -280 ml     Assessment:  67 y.o. male is s/p:  Aortobifemoral bypass grafting  4 Days Post-Op  Plan: -pt tolerated clear liquids yesterday-will advance to regular diet today.  Discussed with pt to take it slow. -+palpable DP pulses  bilaterally; he does have BLE leg swelling with left>right.  Will order venous duplex left leg.  Instructed pt to elevate legs when not walking -still somewhat hypertensive-will increase his Toprol to 25mg  daily -DVT prophylaxis:  Lovenox -discussed with pt he may be discharged in the next couple of days-he says he may ned to go to a facility to not burden his wife for her to take days off.  Will ask social work to get involved in case that is needed, but will see how he progresses over the next day or two. -continue abx -dry dressing between toes.   Doreatha MassedSamantha Rhyne, PA-C [facility] [phone] 12/07/2017 7:59 AM  Advance diet Duplex to rule out DVT with swelling in leg Continue antbiotics  Fabienne Brunsharles Pritesh Sobecki, MD [facility] [place] Office: [phone] Pager: [phone]

## 2017-12-07 NOTE — Progress Notes (Signed)
Pt has had several episodes of non sustained tachycardia- highest rate in 140s. Some episodes are while patient is moving around, others while he is laying in bed. VSS. Patient is asymptomatic. Will continue to monitor.  [name]  Marcelyn Ruppe, RN

## 2017-12-08 MED ORDER — FUROSEMIDE 10 MG/ML IJ SOLN
40.0000 mg | Freq: Four times a day (QID) | INTRAMUSCULAR | Status: AC
  Administered 2017-12-08 (×2): 40 mg via INTRAVENOUS
  Filled 2017-12-08 (×2): qty 4

## 2017-12-08 MED ORDER — POTASSIUM CHLORIDE CRYS ER 20 MEQ PO TBCR
40.0000 meq | EXTENDED_RELEASE_TABLET | Freq: Two times a day (BID) | ORAL | Status: AC
  Administered 2017-12-08 (×2): 40 meq via ORAL
  Filled 2017-12-08 (×2): qty 2

## 2017-12-08 NOTE — Progress Notes (Signed)
PT Cancellation Note  Patient Details Name: Austin DurandStephen Hohler MRN: [id number] DOB: 06/15/1951   Cancelled Treatment:    Reason Eval/Treat Not Completed: Patient declined, no reason specified. On arrival to room pt initially agreeable to mobilizing, however misunderstood that he would be ambulating before sitting up in the chair. Pt declined, stating he needed to eat lunch before he could walk. Advised that therapy would likely not be able to come back today. Pt understands. Treatment deferred till tomorrow.   Kallie LocksHannah Rocquel Askren, PTA Pager [phone] Acute Rehab   Sheral ApleyHannah E Keaten Mashek 12/08/2017, 1:48 PM

## 2017-12-08 NOTE — Progress Notes (Addendum)
Occupational Therapy Treatment Patient Details Name: Austin [name] MRN: [id number] DOB: 01/03/1951 Today's Date: 12/08/2017    History of present illness Pt adm with lt great toe gangrene. Pt underwent aorto bifemoral bypass graft and aortic enarterectomy on 12/03/17. PMH - back surgery, HTN, bil THR   OT comments  Pt making excellent progress. Pt states "I've just been lazy". Pt able to complete toilet transfer and ambulate 210f @ RW level with S. Encourage pt to ambulate with staff this pm. Will follow tomorrow with focus on ADL retraining with AE as needed. VSS  Follow Up Recommendations  Supervision - Intermittent  HHOT   Equipment Recommendations  3 in 1 bedside commode    Recommendations for Other Services      Precautions / Restrictions Precautions Precautions: Fall       Mobility Bed Mobility Overal bed mobility: Modified Independent                Transfers Overall transfer level: Needs assistance Equipment used: Rolling walker (2 wheeled)   Sit to Stand: Supervision         General transfer comment: vc for hand placement    Balance Overall balance assessment: Needs assistance   Sitting balance-Leahy Scale: Good       Standing balance-Leahy Scale: Fair                             ADL either performed or assessed with clinical judgement   ADL   Eating/Feeding: Independent   Grooming: Set up;Standing   Upper Body Bathing: Set up;Sitting       Upper Body Dressing : Set up;Sitting   Lower Body Dressing: Minimal assistance;Sit to/from stand   Toilet Transfer: Supervision/safety;RW;Comfort height toilet;Ambulation   Toileting- Clothing Manipulation and Hygiene: Set up;Sit to/from stand       Functional mobility during ADLs: Supervision/safety;Rolling walker;Cueing for safety  Educated on need to turn walker sideways to simulate bathroom mobility. Pt able to transfer form chair without armrests with S and walk @ 4 feet to  recliner without use of RW.  Advised to use reacher to reduce risk of falls    Vision       Perception     Praxis      Cognition Arousal/Alertness: Awake/alert Behavior During Therapy: WFL for tasks assessed/performed Overall Cognitive Status: Within Functional Limits for tasks assessed                                          Exercises     Shoulder Instructions       General Comments      Pertinent Vitals/ Pain       Pain Assessment: Faces Faces Pain Scale: Hurts a little bit Pain Location: groin Pain Descriptors / Indicators: Sore Pain Intervention(s): Limited activity within patient's tolerance  Home Living                                          Prior Functioning/Environment              Frequency  Min 3X/week        Progress Toward Goals  OT Goals(current goals can now be found in the care plan section)  Progress towards OT goals:  Goals met and updated - see care plan  Acute Rehab OT Goals Patient Stated Goal: to go to rehab OT Goal Formulation: With patient/family Time For Goal Achievement: 12/18/17 Potential to Achieve Goals: Good ADL Goals Pt Will Perform Lower Body Bathing: with modified independence;with adaptive equipment;sit to/from stand Pt Will Perform Lower Body Dressing: with modified independence;sit to/from stand;with adaptive equipment Pt Will Transfer to Toilet: with modified independence;ambulating;regular height toilet Pt Will Perform Toileting - Clothing Manipulation and hygiene: with modified independence;sitting/lateral leans  Plan Discharge plan remains appropriate;Frequency needs to be updated    Co-evaluation                 AM-PAC PT "6 Clicks" Daily Activity     Outcome Measure   Help from another person eating meals?: None Help from another person taking care of personal grooming?: None Help from another person toileting, which includes using toliet, bedpan, or urinal?:  A Little Help from another person bathing (including washing, rinsing, drying)?: A Little Help from another person to put on and taking off regular upper body clothing?: None Help from another person to put on and taking off regular lower body clothing?: A Little 6 Click Score: 21    End of Session Equipment Utilized During Treatment: Rolling walker;Gait belt;Other (comment)(wound vac)  OT Visit Diagnosis: Unsteadiness on feet (R26.81);Other abnormalities of gait and mobility (R26.89);Pain Pain - part of body: (groin)   Activity Tolerance Patient tolerated treatment well   Patient Left in chair;with call bell/phone within reach;with family/visitor present   Nurse Communication Mobility status        Time: 6333-5456 OT Time Calculation (min): 29 min  Charges: OT General Charges $OT Visit: 1 Visit OT Treatments $Self Care/Home Management : 23-37 mins  [facility], OT/L  [phone] 12/08/2017   Austin [name],Austin [name] 12/08/2017, 2:17 PM

## 2017-12-08 NOTE — Progress Notes (Addendum)
  Progress Note    12/08/2017 7:09 AM 5 Days Post-Op  Subjective:  Says the swelling in his legs has him befuddled.  Says he tolerated his diet yesterday.  Says he didn't know when his heart rate was up yesterday.    Afebrile HR 50's-80's NSR 150's-180's systolic 96% RA  Vitals:   12/08/17 0511 12/08/17 0520  BP: (!) 188/85   Pulse: 65   Resp: 16 12  Temp: 98.3 F (36.8 C)   SpO2: 96%     Physical Exam: General:  No distress Lungs:  Non labored Incisions:  Laparotomy incision is clean and dry; bilateral groin pravena vacs continue to have good seal Extremities:  Still with leg swelling left>right but slightly improved and softer.  Bilateral feet are warm and well perfused.  Less odor today from left toe. Abdomen:  Soft, NT/ND; +BM; +flatus  CBC    Component Value Date/Time   WBC 11.7 (H) 12/06/2017 0421   RBC 3.15 (L) 12/06/2017 0421   HGB 9.3 (L) 12/06/2017 0421   HCT 28.2 (L) 12/06/2017 0421   PLT 214 12/06/2017 0421   MCV 89.5 12/06/2017 0421   MCH 29.5 12/06/2017 0421   MCHC 33.0 12/06/2017 0421   RDW 14.4 12/06/2017 0421   LYMPHSABS 1.5 12/02/2017 0711   MONOABS 0.8 12/02/2017 0711   EOSABS 0.2 12/02/2017 0711   BASOSABS 0.1 12/02/2017 0711    BMET    Component Value Date/Time   NA 138 12/06/2017 0421   K 4.0 12/06/2017 0421   CL 107 12/06/2017 0421   CO2 22 12/06/2017 0421   GLUCOSE 93 12/06/2017 0421   BUN 21 (H) 12/06/2017 0421   CREATININE 1.14 12/06/2017 0421   CALCIUM 7.6 (L) 12/06/2017 0421   GFRNONAA >60 12/06/2017 0421   GFRAA >60 12/06/2017 0421    INR    Component Value Date/Time   INR 1.17 12/03/2017 1445     Intake/Output Summary (Last 24 hours) at 12/08/2017 0709 Last data filed at 12/08/2017 0514 Gross per 24 hour  Intake 960 ml  Output 1375 ml  Net -415 ml     Assessment:  67 y.o. male is s/p:  Aortobifemoral bypass grafting  5 Days Post-Op  Plan: -pt tolerating regular diet -bilateral feet are warm and well  perfused.  Left great toe seems drier today and less odor.  Continue dry gauze between toes.  -pt had non sustained tachycardia yesterday with rate up to 140's-pt was asymptomatic but as low as 60.  His beta blocker was increased yesterday to 25mg  daily.  His blood pressure this am was 180's.  May need additional antihypertensive-possible ACEI.  Will d/w Dr. Myra GianottiBrabham -will ask social work to see pt-he feels he may need to go to rehab facility to bridge to home as his wife works.    -DVT prophylaxis:  Lovenox   Doreatha MassedSamantha Rhyne, PA-C [facility] [phone] 12/08/2017 7:09 AM  I agree with the above C/o leg swelling:  Would benefit from compression stockings after d/c.  Diuresis today with lasix D/c Proveena wound vacs prior to discharge D/c planning in progress.  Ready for d/c once suitable location determined. F/u great toe at discharge.  May require amputation at a  Later date appreciate hospitalists assistance  Durene CalWells Patrick Sohm

## 2017-12-08 NOTE — Progress Notes (Signed)
PROGRESS NOTE    Austin DurandStephen [name]  ZHY:[id number] DOB: 01/15/1951 DOA: 11/26/2017 PCP: Patient, No Pcp Per  Brief Narrative:67 year old male with no past medical history, lifelong smoker did not see a physician regularly presented to the emergency room with black discoloration of the left great toe with progressive swelling erythema and tenderness of the left foot. -and he was found to have severe critical limb ischemia, evaluated by vascular surgery and felt need aorto bifemoral bypass graft. -through his hospital stay he was found to have an abnormal echocardiogram with poor motion abnormality, subsequently underwent a stress test which was abnormal followed by a heart catheterization on 3/18 with surprisingly showed, normal coronaries with no significant CAD. 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)with foot cellulitis - severe PAD in lifelong smoker with critical limb ischemia -CT angiogram abdomen pelvis with runoff noted CT angiogram abdomen pelvis with runoff noted distal Aortic occlusion, s/p Aortobifemoral bypass graft 3/20 -x-ray without evidence evidence of osteomyelitis -on Abx per VVS -Ambulating with PT, tolerating diet, considering Rehab -clinically volume overloaded -iatrogenic likely, lasix x2 doses should help with BP too  Hypertension -continue PO Metoprolol, will give 2 doses of lasix IV, he is volume overloaded and diuresis will help with edema and BP -hydralazine PRN  Suspected COPD/heavy smoker -Stable, nebs when necessary  Abnormal stress test -No significant CAD noted on cardiac cath 3/18 -Continue aspirin and statin beta blocker  DVT prophylaxis: lovenox Code Status: Full code Family Communication: family at bedside Disposition Plan: SNF per VVS  Consultants:  VVS Cards   Procedures:   Left heart cath 3/18  Aortibifem bypass 3/20  Antimicrobials:    Subjective: -feels ok, BP up, ambulating in  halls, some intermittent pain in foot  Objective: Vitals:   12/08/17 0007 12/08/17 0511 12/08/17 0520 12/08/17 0835  BP: (!) 154/72 (!) 188/85  (!) 191/84  Pulse: 60 65  83  Resp: 13 16 12    Temp: 98.7 F (37.1 C) 98.3 F (36.8 C)    TempSrc: Oral Oral    SpO2: 96% 96%    Weight:   86.3 kg (190 lb 4.8 oz)   Height:        Intake/Output Summary (Last 24 hours) at 12/08/2017 1514 Last data filed at 12/08/2017 0514 Gross per 24 hour  Intake 360 ml  Output 1125 ml  Net -765 ml   Filed Weights   11/28/17 1059 12/06/17 0600 12/08/17 0520  Weight: 92.5 kg (204 lb) 93.5 kg (206 lb 2.1 oz) 86.3 kg (190 lb 4.8 oz)    Examination:  Gen: Awake, Alert, Oriented X 3,  HEENT: PERRLA, Neck supple, no JVD Lungs: Good air movement bilaterally, CTAB CVS: RRR,No Gallops,Rubs or new Murmurs Abd: soft, Non tender, non distended, BS present Abd incision c/d/i Extremities: 1 plus edema, L great toe with dry gangrene and mild erythema and edema in prox foot Psychiatry: Judgement and insight appear normal. Mood & affect appropriate.     Data Reviewed:   CBC: Recent Labs  Lab 12/02/17 0711 12/03/17 0514  12/03/17 1226 12/03/17 1445 12/04/17 0029 12/04/17 0315 12/06/17 0421  WBC 7.7 8.4  --   --  14.2*  --  13.6* 11.7*  NEUTROABS 5.1  --   --   --   --   --   --   --   HGB 11.7* 11.9*   < > 9.9* 10.8* 11.6* 10.5* 9.3*  HCT  36.0* 37.4*   < > 29.0* 33.5* 34.0* 32.5* 28.2*  MCV 91.1 91.2  --   --  90.5  --  89.3 89.5  PLT 290 291  --   --  248  --  267 214   < > = values in this interval not displayed.   Basic Metabolic Panel: Recent Labs  Lab 12/02/17 0711 12/03/17 0514  12/03/17 1226 12/03/17 1445 12/04/17 0029 12/04/17 0315 12/06/17 0421  NA 138 137   < > 140 139 140 136 138  K 3.9 4.4   < > 4.3 4.1 4.2 4.2 4.0  CL 107 104  --   --  108 105 105 107  CO2 22 22  --   --  20*  --  21* 22  GLUCOSE 115* 87  --   --  156* 166* 158* 93  BUN 13 13  --   --  12 15 14  21*    CREATININE 0.95 0.91  --   --  1.14 0.90 1.17 1.14  CALCIUM 8.6* 8.6*  --   --  7.9*  --  7.8* 7.6*  MG  --   --   --   --  1.7  --  1.9  --    < > = values in this interval not displayed.   GFR: Estimated Creatinine Clearance: 67.8 mL/min (by C-G formula based on SCr of 1.14 mg/dL). Liver Function Tests: Recent Labs  Lab 12/04/17 0315  AST 45*  ALT 44  ALKPHOS 53  BILITOT 0.2*  PROT 5.8*  ALBUMIN 2.8*   Recent Labs  Lab 12/04/17 0315  AMYLASE 48   No results for input(s): AMMONIA in the last 168 hours. Coagulation Profile: Recent Labs  Lab 12/03/17 1445  INR 1.17   Cardiac Enzymes: No results for input(s): CKTOTAL, CKMB, CKMBINDEX, TROPONINI in the last 168 hours. BNP (last 3 results) No results for input(s): PROBNP in the last 8760 hours. HbA1C: No results for input(s): HGBA1C in the last 72 hours. CBG: Recent Labs  Lab 12/05/17 2012  GLUCAP 110*   Lipid Profile: No results for input(s): CHOL, HDL, LDLCALC, TRIG, CHOLHDL, LDLDIRECT in the last 72 hours. Thyroid Function Tests: No results for input(s): TSH, T4TOTAL, FREET4, T3FREE, THYROIDAB in the last 72 hours. Anemia Panel: No results for input(s): VITAMINB12, FOLATE, FERRITIN, TIBC, IRON, RETICCTPCT in the last 72 hours. Urine analysis:    Component Value Date/Time   COLORURINE YELLOW 11/26/2017 2136   APPEARANCEUR CLEAR 11/26/2017 2136   LABSPEC 1.024 11/26/2017 2136   PHURINE 6.0 11/26/2017 2136   GLUCOSEU NEGATIVE 11/26/2017 2136   HGBUR NEGATIVE 11/26/2017 2136   BILIRUBINUR NEGATIVE 11/26/2017 2136   KETONESUR NEGATIVE 11/26/2017 2136   PROTEINUR NEGATIVE 11/26/2017 2136   NITRITE NEGATIVE 11/26/2017 2136   LEUKOCYTESUR NEGATIVE 11/26/2017 2136   Sepsis Labs: @LABRCNTIP (procalcitonin:4,lacticidven:4)  ) Recent Results (from the past 240 hour(s))  Surgical PCR screen     Status: Abnormal   Collection Time: 12/02/17 11:42 AM  Result Value Ref Range Status   MRSA, PCR POSITIVE (A)  NEGATIVE Final    Comment: RESULT CALLED TO, READ BACK BY AND VERIFIED WITH: Dione Plover RN 13:25 12/02/17 ([email or web address])    Staphylococcus aureus POSITIVE (A) NEGATIVE Final    Comment: (NOTE) The Xpert SA Assay (FDA approved for NASAL specimens in patients 53 years of age and older), is one component of a comprehensive surveillance program. It is not intended to diagnose infection nor to guide or monitor  treatment. Performed at [facility], [address], [place], [place] [address]          Radiology Studies: No results found.      Scheduled Meds: . Chlorhexidine Gluconate Cloth  6 each Topical Q0600  . docusate  100 mg Per Tube Daily  . enoxaparin (LOVENOX) injection  40 mg Subcutaneous Q24H  . furosemide  40 mg Intravenous Q6H  . metoprolol succinate  25 mg Oral Daily  . pantoprazole  40 mg Oral Daily  . potassium chloride  40 mEq Oral BID  . sodium chloride flush  10-40 mL Intracatheter Q12H   Continuous Infusions: . sodium chloride    . clevidipine Stopped (12/05/17 1433)  . magnesium sulfate 1 - 4 g bolus IVPB    . vancomycin Stopped (12/08/17 1128)     LOS: 12 days    Time spent:    Zannie Cove, MD [facility] Page via [email or web address], password [email or web address] After 7PM please contact night-coverage  12/08/2017, 3:14 PM

## 2017-12-09 LAB — BASIC METABOLIC PANEL
ANION GAP: 13 (ref 5–15)
BUN: 13 mg/dL (ref 6–20)
CO2: 21 mmol/L — ABNORMAL LOW (ref 22–32)
Calcium: 8.3 mg/dL — ABNORMAL LOW (ref 8.9–10.3)
Chloride: 104 mmol/L (ref 101–111)
Creatinine, Ser: 1.19 mg/dL (ref 0.61–1.24)
Glucose, Bld: 84 mg/dL (ref 65–99)
Potassium: 3.9 mmol/L (ref 3.5–5.1)
SODIUM: 138 mmol/L (ref 135–145)

## 2017-12-09 MED ORDER — POTASSIUM CHLORIDE CRYS ER 20 MEQ PO TBCR
40.0000 meq | EXTENDED_RELEASE_TABLET | Freq: Two times a day (BID) | ORAL | Status: AC
  Administered 2017-12-09 (×2): 40 meq via ORAL
  Filled 2017-12-09 (×2): qty 2

## 2017-12-09 MED ORDER — FUROSEMIDE 10 MG/ML IJ SOLN
40.0000 mg | Freq: Four times a day (QID) | INTRAMUSCULAR | Status: AC
  Administered 2017-12-09 (×2): 40 mg via INTRAVENOUS
  Filled 2017-12-09 (×2): qty 4

## 2017-12-09 MED ORDER — AMLODIPINE BESYLATE 10 MG PO TABS
10.0000 mg | ORAL_TABLET | Freq: Every day | ORAL | Status: DC
  Administered 2017-12-09 – 2017-12-10 (×2): 10 mg via ORAL
  Filled 2017-12-09 (×2): qty 1

## 2017-12-09 MED FILL — Heparin Sodium (Porcine) Inj 1000 Unit/ML: INTRAMUSCULAR | Qty: 30 | Status: AC

## 2017-12-09 MED FILL — Sodium Chloride IV Soln 0.9%: INTRAVENOUS | Qty: 1000 | Status: AC

## 2017-12-09 NOTE — Social Work (Signed)
CSW aware per RN Case Manager notet that pt is declining both SNF and HH.  CSW signing off. Please consult if any additional needs arise.  Doy HutchingIsabel H Pierre Dellarocco, LCSWA [facility] Clinical Social Work [phone]

## 2017-12-09 NOTE — Progress Notes (Signed)
PROGRESS NOTE    Austin DurandStephen [name]  ZOX:[id number] DOB: 06/30/1951 DOA: 11/26/2017 PCP: Patient, No Pcp Per  Brief Narrative:67 year old male with no past medical history, lifelong smoker did not see a physician regularly presented to the emergency room with black discoloration of the left great toe with progressive swelling erythema and tenderness of the left foot. -and he was found to have severe critical limb ischemia, evaluated by vascular surgery and felt need aorto bifemoral bypass graft. -through his hospital stay he was found to have an abnormal echocardiogram with poor motion abnormality, subsequently underwent a stress test which was abnormal followed by a heart catheterization on 3/18 with surprisingly showed, normal coronaries with no significant CAD. 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 Toprol 25mg  daily, 2. Amlodipine 10mg  daily 3. lasix 40mg  daily with KCL 40mEQ daily for 3days 4. ASA 81mg  daily  Hypertension -continue PO Metoprolol, remains vol overloaded and hence give 2more doses of IV lasix today -add PO amlodipine -hydralazine PRN -BP improving with diuresis  Suspected COPD/heavy smoker -Stable, nebs when necessary  Abnormal stress test -No significant CAD noted on cardiac cath 3/18 -Continue aspirin and statin beta blocker  DVT prophylaxis: lovenox Code Status: Full code Family Communication: family at bedside Disposition Plan: DC per VVS  Consultants:  VVS Cards   Procedures:   Left heart cath 3/18  Aortibifem bypass 3/20  Antimicrobials:     Subjective: -swelling better, breathing improving  Objective: Vitals:   12/08/17 2200 12/09/17 0016 12/09/17 0402 12/09/17 0854  BP: (!) 187/81 (!) 150/68 (!) 170/65 (!) 180/68  Pulse:   68 75  Resp: 15 17 16    Temp:  98.9 F (37.2 C) 98.8 F (37.1 C)   TempSrc:  Oral Oral   SpO2:  92% 92%   Weight:   94.3 kg (208 lb)   Height:        Intake/Output Summary (Last 24 hours) at 12/09/2017 1209 Last data filed at 12/09/2017 0900 Gross per 24 hour  Intake 490 ml  Output 4600 ml  Net -4110 ml   Filed Weights   12/06/17 0600 12/08/17 0520 12/09/17 0402  Weight: 93.5 kg (206 lb 2.1 oz) 86.3 kg (190 lb 4.8 oz) 94.3 kg (208 lb)    Examination:  Gen: Awake, Alert, Oriented X 3,  HEENT: PERRLA, Neck supple, no JVD Lungs: Good air movement bilaterally, CTAB CVS: RRR,No Gallops,Rubs or new Murmurs Abd: soft, Non tender, non distended, BS present Abd incision c/d/i Extremities: 1 plus edema, L great toe with dry gangrene and mild erythema and edema in prox foot Psychiatry: Judgement and insight appear normal. Mood & affect appropriate.     Data Reviewed:   CBC: Recent Labs  Lab 12/03/17 0514  12/03/17 1226 12/03/17 1445 12/04/17 0029 12/04/17 0315 12/06/17 0421  WBC 8.4  --   --  14.2*  --  13.6* 11.7*  HGB 11.9*   < > 9.9* 10.8* 11.6* 10.5* 9.3*  HCT 37.4*   < > 29.0* 33.5* 34.0* 32.5* 28.2*  MCV 91.2  --   --  90.5  --  89.3 89.5  PLT 291  --   --  248  --  267 214   < > = values in this interval not displayed.   Basic Metabolic Panel: Recent Labs  Lab 12/03/17 0514  12/03/17 1445 12/04/17 0029 12/04/17 0315 12/06/17 0421 12/09/17 0241  NA 137   < > 139 140 136 138 138  K 4.4   < > 4.1 4.2 4.2 4.0 3.9  CL 104  --  108 105 105 107 104  CO2 22  --  20*  --  21* 22 21*  GLUCOSE 87  --  156* 166* 158* 93 84  BUN 13  --  12 15 14  21* 13  CREATININE 0.91  --  1.14 0.90 1.17 1.14 1.19  CALCIUM 8.6*  --  7.9*  --  7.8* 7.6* 8.3*  MG  --   --  1.7  --  1.9   --   --    < > = values in this interval not displayed.   GFR: Estimated Creatinine Clearance: 67.8 mL/min (by C-G formula based on SCr of 1.19 mg/dL). Liver Function Tests: Recent Labs  Lab 12/04/17 0315  AST 45*  ALT 44  ALKPHOS 53  BILITOT 0.2*  PROT 5.8*  ALBUMIN 2.8*   Recent Labs  Lab 12/04/17 0315  AMYLASE 48   No results for input(s): AMMONIA in the last 168 hours. Coagulation Profile: Recent Labs  Lab 12/03/17 1445  INR 1.17   Cardiac Enzymes: No results for input(s): CKTOTAL, CKMB, CKMBINDEX, TROPONINI in the last 168 hours. BNP (last 3 results) No results for input(s): PROBNP in the last 8760 hours. HbA1C: No results for input(s): HGBA1C in the last 72 hours. CBG: Recent Labs  Lab 12/05/17 2012  GLUCAP 110*   Lipid Profile: No results for input(s): CHOL, HDL, LDLCALC, TRIG, CHOLHDL, LDLDIRECT in the last 72 hours. Thyroid Function Tests: No results for input(s): TSH, T4TOTAL, FREET4, T3FREE, THYROIDAB in the last 72 hours. Anemia Panel: No results for input(s): VITAMINB12, FOLATE, FERRITIN, TIBC, IRON, RETICCTPCT in the last 72 hours. Urine analysis:    Component Value Date/Time   COLORURINE YELLOW 11/26/2017 2136   APPEARANCEUR CLEAR 11/26/2017 2136   LABSPEC 1.024 11/26/2017 2136   PHURINE 6.0 11/26/2017 2136   GLUCOSEU NEGATIVE 11/26/2017 2136   HGBUR NEGATIVE 11/26/2017 2136   BILIRUBINUR NEGATIVE 11/26/2017 2136   KETONESUR NEGATIVE 11/26/2017 2136   PROTEINUR NEGATIVE 11/26/2017 2136   NITRITE NEGATIVE 11/26/2017 2136   LEUKOCYTESUR NEGATIVE 11/26/2017 2136   Sepsis Labs: @LABRCNTIP (procalcitonin:4,lacticidven:4)  ) Recent Results (from the past 240 hour(s))  Surgical PCR screen     Status: Abnormal   Collection Time: 12/02/17 11:42 AM  Result Value Ref Range Status   MRSA, PCR POSITIVE (A) NEGATIVE Final    Comment: RESULT CALLED TO, READ BACK BY AND VERIFIED WITH: Dione Plover RN 13:25 12/02/17 ([email or web address])    Staphylococcus aureus  POSITIVE (A) NEGATIVE Final    Comment: (NOTE) The Xpert SA Assay (FDA approved for NASAL specimens in patients 69 years of age and older), is one component of a comprehensive surveillance program. It is not intended to diagnose infection nor to guide or monitor treatment. Performed at [facility], [address], [place], [place] [address]          Radiology Studies: No results found.      Scheduled Meds: . docusate  100 mg Per Tube Daily  . enoxaparin (LOVENOX) injection  40 mg Subcutaneous Q24H  . furosemide  40 mg Intravenous Q6H  .  metoprolol succinate  25 mg Oral Daily  . pantoprazole  40 mg Oral Daily  . potassium chloride  40 mEq Oral BID  . sodium chloride flush  10-40 mL Intracatheter Q12H   Continuous Infusions: . sodium chloride    . clevidipine Stopped (12/05/17 1433)  . magnesium sulfate 1 - 4 g bolus IVPB    . vancomycin Stopped (12/08/17 2142)     LOS: 13 days    Time spent:    Zannie Cove, MD [facility] Page via [email or web address], password [email or web address] After 7PM please contact night-coverage  12/09/2017, 12:09 PM

## 2017-12-09 NOTE — Care Management Note (Addendum)
Case Management Note Donn PieriniKristi Adien Kimmel RN, BSN Unit 4E-Case Manager [phone]  Patient Details  Name: Austin DurandStephen [name] MRN: [id number] Date of Birth: 12/29/1950  Subjective/Objective:  Pt admitted s/p Aortobifemoral bypass grafting                 Action/Plan: PTA pt lived at home with wife- CM spoke with pt regarding transition of care needs- per conversation pt reports that [place] and not at home much- he has concerns about returning home and "re-adjusted to home" he reports that he wife works 12 shifts and will not be at home to assist him during the day- [facility] to [facility] and does not feel like he needs it as he feels like he can go home with wife once she is there on Friday when she is off for the weekend. Pt also does not feel like he will need HH services- and declines and HH referrals at this time- pt states he has a cane at home- does not want a RW or any other DME- Prevena wound VAC to come off at discharge per MD note. Informed pt that it would be up to doctor when pt was medically stable for discharge.   Expected Discharge Date:                  Expected Discharge Plan:  Home w Home Health Services  In-House Referral:  Clinical Social Work  Discharge planning Services  CM Consult  Post Acute Care Choice:    Choice offered to:  Patient  DME Arranged:    DME Agency:     HH Arranged:    HH Agency:     Status of Service:  In process, will continue to follow  If discussed at Long Length of Stay Meetings, dates discussed:    Discharge Disposition:   Additional Comments:  Darrold SpanWebster, Lynnlee Revels Hall, RN 12/09/2017, 10:48 AM

## 2017-12-09 NOTE — Progress Notes (Addendum)
Occupational Therapy Treatment Patient Details Name: Tallon Gertz MRN: [id number] DOB: 05-16-1951 Today's Date: 12/09/2017    History of present illness Pt adm with lt great toe gangrene. Pt underwent aorto bifemoral bypass graft and aortic enarterectomy on 12/03/17. PMH - back surgery, HTN, bil THR   OT comments  Pt seen for ADL session this am. Pt had a LOB posteriorly and required Mod A from therapist to prevent fall. Pt expressing concerns over DC home alone (while wife is at work). Feel pt needs supervision with all mobility at this time given LOB with dynamic task at sink. Ideal situation would be to DC home Friday am/Thursday pm due to wife ending her shift Thursday and pt following up with HHOT/PT. If that is not an option, pt may need short term SNF.  Encourage ambulation with staff @ RW level.  Follow Up Recommendations  Supervision/Assistance - 24 hour;Home health OT;SNF    Equipment Recommendations  3 in 1 bedside commode    Recommendations for Other Services      Precautions / Restrictions Precautions Precautions: Fall       Mobility Bed Mobility Overal bed mobility: Modified Independent                Transfers Overall transfer level: Needs assistance Equipment used: Rolling walker (2 wheeled) Transfers: Sit to/from [employer] Sit to Stand: Supervision Stand pivot transfers: Supervision            Balance             Standing balance-Leahy Scale: Fair Standing balance comment: LOB posteriorly at sink                           ADL either performed or assessed with clinical judgement   ADL Overall ADL's : Needs assistance/impaired         Upper Body Bathing: Set up;Sitting   Lower Body Bathing: Minimal assistance;Sit to/from stand Lower Body Bathing Details (indicate cue type and reason): A for feet     Lower Body Dressing: Minimal assistance;Sit to/from stand   Toilet Transfer:  Supervision/safety;RW;Regular Toilet           Functional mobility during ADLs: Supervision/safety;Rolling walker;Cueing for Museum/gallery exhibitions officer      Cognition Arousal/Alertness: Awake/alert Behavior During Therapy: WFL for tasks assessed/performed Overall Cognitive Status: Within Functional Limits for tasks assessed                                          Exercises     Shoulder Instructions       General Comments      Pertinent Vitals/ Pain       Pain Assessment: Faces Faces Pain Scale: Hurts a little bit Pain Location: groin Pain Descriptors / Indicators: Sore Pain Intervention(s): Limited activity within patient's tolerance  Home Living                                          Prior Functioning/Environment              Frequency  Min 3X/week        Progress Toward Goals  OT  Goals(current goals can now be found in the care plan section)  Progress towards OT goals: Progressing toward goals  Acute Rehab OT Goals Patient Stated Goal: to go to rehab OT Goal Formulation: With patient/family Time For Goal Achievement: 12/18/17 Potential to Achieve Goals: Good ADL Goals Pt Will Perform Lower Body Bathing: with modified independence;with adaptive equipment;sit to/from stand Pt Will Perform Lower Body Dressing: with modified independence;sit to/from stand;with adaptive equipment Pt Will Transfer to Toilet: with modified independence;ambulating;regular height toilet Pt Will Perform Toileting - Clothing Manipulation and hygiene: with modified independence;sitting/lateral leans Additional ADL Goal #1: Pt will perform bed mobility at supervision level prior to engaging in ADL activity  Plan Discharge plan needs to be updated    Co-evaluation                 AM-PAC PT "6 Clicks" Daily Activity     Outcome Measure   Help from another person eating meals?: None Help from another  person taking care of personal grooming?: None Help from another person toileting, which includes using toliet, bedpan, or urinal?: A Little Help from another person bathing (including washing, rinsing, drying)?: A Little Help from another person to put on and taking off regular upper body clothing?: None Help from another person to put on and taking off regular lower body clothing?: A Little 6 Click Score: 21    End of Session Equipment Utilized During Treatment: Rolling walker;Gait belt  OT Visit Diagnosis: Unsteadiness on feet (R26.81);Other abnormalities of gait and mobility (R26.89);Pain Pain - part of body: (groin)   Activity Tolerance Patient tolerated treatment well   Patient Left in chair;with call bell/phone within reach   Nurse Communication Mobility status;Other (comment)(DC plan)        Time: [id number] OT Time Calculation (min): 34 min  Charges: OT General Charges $OT Visit: 1 Visit OT Treatments $Self Care/Home Management : 23-37 mins  [facility] LLC[facility] Miloh Alcocer, OT/L  [phone] 12/09/2017   Aneesa Romey,HILLARY 12/09/2017, 10:14 AM

## 2017-12-09 NOTE — Progress Notes (Addendum)
  Progress Note    12/09/2017 7:44 AM 6 Days Post-Op  Subjective:  No complaints; says he walked some more yesterday  Afebrile HR 60's-80's NSR 150's-180's systolic 94% RA  Vitals:   12/09/17 0016 12/09/17 0402  BP: (!) 150/68 (!) 170/65  Pulse:  68  Resp: 17 16  Temp: 98.9 F (37.2 C) 98.8 F (37.1 C)  SpO2: 92% 92%    Physical Exam: Cardiac:  regular Lungs:  Non labored Incisions:  Laparotomy incision is clean and dry; bilateral Pravena vacs with good seal. Extremities:  Left great toe is drying up; swelling in legs much better today Abdomen:  Soft, NT/ND; +BM; -N/V  CBC    Component Value Date/Time   WBC 11.7 (H) 12/06/2017 0421   RBC 3.15 (L) 12/06/2017 0421   HGB 9.3 (L) 12/06/2017 0421   HCT 28.2 (L) 12/06/2017 0421   PLT 214 12/06/2017 0421   MCV 89.5 12/06/2017 0421   MCH 29.5 12/06/2017 0421   MCHC 33.0 12/06/2017 0421   RDW 14.4 12/06/2017 0421   LYMPHSABS 1.5 12/02/2017 0711   MONOABS 0.8 12/02/2017 0711   EOSABS 0.2 12/02/2017 0711   BASOSABS 0.1 12/02/2017 0711    BMET    Component Value Date/Time   NA 138 12/09/2017 0241   K 3.9 12/09/2017 0241   CL 104 12/09/2017 0241   CO2 21 (L) 12/09/2017 0241   GLUCOSE 84 12/09/2017 0241   BUN 13 12/09/2017 0241   CREATININE 1.19 12/09/2017 0241   CALCIUM 8.3 (L) 12/09/2017 0241   GFRNONAA >60 12/09/2017 0241   GFRAA >60 12/09/2017 0241    INR    Component Value Date/Time   INR 1.17 12/03/2017 1445     Intake/Output Summary (Last 24 hours) at 12/09/2017 0744 Last data filed at 12/09/2017 0600 Gross per 24 hour  Intake 615 ml  Output 4400 ml  Net -3785 ml     Assessment:  67 y.o. male is s/p:  Aortobifemoral bypass grafting  6 Days Post-Op  Plan: -pt doing well -BLE swelling has improved -still hypertensive-may need to add to his regimen.  -continue IV abx while in the hospital per Dr. Myra GianottiBrabham -DVT prophylaxis:  Lovenox -social work ordered yesterday for possible  placement.   Doreatha MassedSamantha Rhyne, PA-C [facility] [phone] 12/09/2017 7:44 AM   I agree with the above.  Ihave seen and evaluated the patient. D/c abx at discharge Remove wound vac prior to discharge.  PATIENT WOULD LIKE TO BE ESTABLISHED WITH A PCP AT DISCHARGE.  HE will need follow up for his HTN and does not have a local MD.  Durene CalWells Fernando Stoiber

## 2017-12-09 NOTE — Progress Notes (Signed)
Patient oxygen saturation dropping down to 87-89% on room air while sleeping.Oxygen at 2 liters nasal canula applied and oxygen saturations gradually increased to 92-94%.Will continue to monitor.

## 2017-12-09 NOTE — Progress Notes (Signed)
Physical Therapy Treatment Patient Details Name: Austin [name] MRN: [id number] DOB: 06/07/1951 Today's Date: 12/09/2017    History of Present Illness Pt adm with lt great toe gangrene. Pt underwent aorto bifemoral bypass graft and aortic enarterectomy on 12/03/17. PMH - back surgery, HTN, bil THR    PT Comments    Pt performed gait and functional mobility.  Pt continues to improve and requiring decreased assistance.  Pt remains heavily reliant on B UEs during gait training but reports he cannot use a RW at home as it will not fit.  Plan next session for gait training with SPC to assess safety.  Pt with bout of urinary incontinence when trying to position urinal.  Plan to return home remain appropriate.      Follow Up Recommendations  Home health PT;Supervision - Intermittent     Equipment Recommendations  (Pt refusing RW as he reports it will not fit in his home.  )    Recommendations for Other Services       Precautions / Restrictions Precautions Precautions: Fall Restrictions Weight Bearing Restrictions: No    Mobility  Bed Mobility Overal bed mobility: Modified Independent Bed Mobility: Supine to Sit;Sit to Supine     Supine to sit: Supervision Sit to supine: Supervision   General bed mobility comments: No assistance needed during session.    Transfers Overall transfer level: Needs assistance Equipment used: Rolling walker (2 wheeled) Transfers: Sit to/from [employer]Stand;Stand Pivot Transfers Sit to Stand: Supervision Stand pivot transfers: Supervision       General transfer comment: vc for hand placement  Ambulation/Gait Ambulation/Gait assistance: Min guard;Supervision Ambulation Distance (Feet): 200 Feet Assistive device: Rolling walker (2 wheeled) Gait Pattern/deviations: Trunk flexed;Step-through pattern;Decreased stride length;Wide base of support Gait velocity: decr Gait velocity interpretation: Below normal speed for age/gender General Gait Details: assist  for safety. Frequent verbal cues to stand more erect and step closer to RW.  Increased WOB observed.  Amb on RA with SpO2>98%   Stairs            Wheelchair Mobility    Modified Rankin (Stroke Patients Only)       Balance Overall balance assessment: Needs assistance   Sitting balance-Leahy Scale: Good       Standing balance-Leahy Scale: Fair Standing balance comment: LOB posteriorly at sink                            Cognition Arousal/Alertness: Awake/alert Behavior During Therapy: WFL for tasks assessed/performed Overall Cognitive Status: Within Functional Limits for tasks assessed Area of Impairment: Problem solving                             Problem Solving: Slow processing;Requires verbal cues;Requires tactile cues General Comments: Pt appears agitated with PT treatment.        Exercises      General Comments        Pertinent Vitals/Pain      Home Living                      Prior Function            PT Goals (current goals can now be found in the care plan section) Acute Rehab PT Goals Patient Stated Goal: to go to rehab Potential to Achieve Goals: Good Progress towards PT goals: Progressing toward goals    Frequency  PT Plan Current plan remains appropriate    Co-evaluation              AM-PAC PT "6 Clicks" Daily Activity  Outcome Measure  Difficulty turning over in bed (including adjusting bedclothes, sheets and blankets)?: Unable Difficulty moving from lying on back to sitting on the side of the bed? : Unable Difficulty sitting down on and standing up from a chair with arms (e.g., wheelchair, bedside commode, etc,.)?: Unable Help needed moving to and from a bed to chair (including a wheelchair)?: A Little Help needed walking in hospital room?: A Little Help needed climbing 3-5 steps with a railing? : A Little 6 Click Score: 12    End of Session Equipment Utilized During  Treatment: Gait belt Activity Tolerance: Patient tolerated treatment well Patient left: in chair;with call bell/phone within reach;with family/visitor present Nurse Communication: Mobility status PT Visit Diagnosis: Other abnormalities of gait and mobility (R26.89);Muscle weakness (generalized) (M62.81);Pain Pain - part of body: (abdomen)     Time: 5284-1324 PT Time Calculation (min) (ACUTE ONLY): 35 min  Charges:  $Gait Training: 8-22 mins $Therapeutic Activity: 8-22 mins                    G Codes:       Austin [name], PTA pager [phone]    Austin [name] 12/09/2017, 4:30 PM

## 2017-12-10 LAB — BASIC METABOLIC PANEL
Anion gap: 12 (ref 5–15)
BUN: 10 mg/dL (ref 6–20)
CHLORIDE: 105 mmol/L (ref 101–111)
CO2: 21 mmol/L — AB (ref 22–32)
CREATININE: 1.1 mg/dL (ref 0.61–1.24)
Calcium: 8.5 mg/dL — ABNORMAL LOW (ref 8.9–10.3)
GFR calc Af Amer: >60 mL/min (ref >60)
GFR calc non Af Amer: >60 mL/min (ref >60)
GLUCOSE: 92 mg/dL (ref 65–99)
Potassium: 4.3 mmol/L (ref 3.5–5.1)
Sodium: 138 mmol/L (ref 135–145)

## 2017-12-10 LAB — GLUCOSE, CAPILLARY: Glucose-Capillary: 127 mg/dL — ABNORMAL HIGH (ref 65–99)

## 2017-12-10 MED ORDER — FUROSEMIDE 10 MG/ML IJ SOLN
40.0000 mg | Freq: Once | INTRAMUSCULAR | Status: AC
  Administered 2017-12-10: 40 mg via INTRAVENOUS
  Filled 2017-12-10: qty 4

## 2017-12-10 MED ORDER — ATORVASTATIN CALCIUM 10 MG PO TABS
10.0000 mg | ORAL_TABLET | Freq: Every day | ORAL | Status: DC
Start: 2017-12-10 — End: 2017-12-10

## 2017-12-10 MED ORDER — AMLODIPINE BESYLATE 10 MG PO TABS: 10.0000 mg | ORAL_TABLET | Freq: Every day | ORAL | 2 refills | Status: DC

## 2017-12-10 MED ORDER — ATORVASTATIN CALCIUM 10 MG PO TABS: 10.0000 mg | ORAL_TABLET | Freq: Every day | ORAL | 2 refills | Status: DC

## 2017-12-10 MED ORDER — OXYCODONE-ACETAMINOPHEN 5-325 MG PO TABS: 1.0000 | ORAL_TABLET | Freq: Four times a day (QID) | ORAL | 0 refills | Status: DC | PRN

## 2017-12-10 MED ORDER — ASPIRIN EC 81 MG PO TBEC: 81.0000 mg | DELAYED_RELEASE_TABLET | Freq: Every day | ORAL | Status: DC

## 2017-12-10 MED ORDER — ASPIRIN EC 81 MG PO TBEC
81.0000 mg | DELAYED_RELEASE_TABLET | Freq: Every day | ORAL | Status: DC
Start: 2017-12-10 — End: 2017-12-10
  Administered 2017-12-10: 81 mg via ORAL
  Filled 2017-12-10: qty 1

## 2017-12-10 MED ORDER — METOPROLOL SUCCINATE ER 25 MG PO TB24: 25.0000 mg | ORAL_TABLET | Freq: Every day | ORAL | 2 refills | Status: DC

## 2017-12-10 MED ORDER — POTASSIUM CHLORIDE CRYS ER 20 MEQ PO TBCR
40.0000 meq | EXTENDED_RELEASE_TABLET | Freq: Once | ORAL | Status: AC
  Administered 2017-12-10: 40 meq via ORAL
  Filled 2017-12-10: qty 2

## 2017-12-10 NOTE — Progress Notes (Addendum)
PROGRESS NOTE    Austin [name]  ZOX:[id number] DOB: [birth year] DOA: 11/26/2017 PCP: Patient, No Pcp Per  Brief Narrative:67 year old male with no past medical history, lifelong smoker did not see a physician regularly presented to the emergency room with black discoloration of the left great toe with progressive swelling erythema and tenderness of the left foot. -and he was found to have severe critical limb ischemia, evaluated by vascular surgery and felt need aorto bifemoral bypass graft. -through his hospital stay he was found to have an abnormal echocardiogram with poor motion abnormality, subsequently underwent a stress test which was abnormal followed by a heart catheterization on 3/18 with surprisingly showed, normal coronaries with no significant CAD. 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)with foot cellulitis - severe PAD in lifelong smoker with critical limb ischemia -s/p Aortobifemoral bypass graft 3/20 -x-ray without evidence evidence of osteomyelitis -on Abx per VVS -Ambulating with PT, tolerating diet,  -volume status improving with lasix , negative 5L in 2days, give another dose of IV lasix now prior to DC -Recommend 1. Toprol XL 25mg  daily, 2. Amlodipine 10mg  daily 3. lasix 40mg  daily with KCL daily for 2days 4. ASA 81mg  daily  Hypertension -continue PO Metoprolol, amlodipine -BP and volume status improving with lasix -would benefit from 2days of PO lasix at DC  Suspected COPD/heavy smoker -Stable, nebs when necessary  Abnormal stress test -No significant CAD noted on cardiac cath 3/18 -Continue aspirin and statin beta blocker  DVT prophylaxis: lovenox Code Status: Full code Family Communication: family at bedside Disposition Plan: DC per [facility]VS, medically stable for home today  Consultants:  [facility] Cards   Procedures:   Left heart cath 3/18  Aortibifem bypass 3/20  Antimicrobials:     Subjective: -doing better, swelling much improved  Objective: Vitals:   12/09/17 2313 12/10/17 0436 12/10/17 0818 12/10/17 1141  BP: 132/63 (!) 159/75 (!) 159/80 (!) 154/73  Pulse: 77 62 70 63  Resp: 12 17    Temp: 98.4 F (36.9 C) 98.5 F (36.9 C)    TempSrc: Oral Oral    SpO2: 94% 94%    Weight:  91.1 kg (200 lb 12.8 oz)    Height:        Intake/Output Summary (Last 24 hours) at 12/10/2017 1232 Last data filed at 12/10/2017 0900 Gross per 24 hour  Intake 2720 ml  Output 2850 ml  Net -130 ml   Filed Weights   12/08/17 0520 12/09/17 0402 12/10/17 0436  Weight: 86.3 kg (190 lb 4.8 oz) 94.3 kg (208 lb) 91.1 kg (200 lb 12.8 oz)    Examination:  Gen: AAOx3 no distress HEENT:  PERRLA Lungs: decreased at bases, rest clear CVS: RRR,No Gallops,Rubs or new Murmurs Abd: soft, Non tender, non distended, BS present Abd incision c/d/i Extremities: 1 plus edema  L great toe with dry gangrene and mild erythema and edema in prox foot Psychiatry: Judgement and insight appear normal. Mood & affect appropriate.     Data Reviewed:   CBC: Recent Labs  Lab 12/03/17 1445 12/04/17 0029 12/04/17 0315 12/06/17 0421  WBC 14.2*  --  13.6* 11.7*  HGB 10.8* 11.6* 10.5* 9.3*  HCT 33.5* 34.0* 32.5* 28.2*  MCV 90.5  --  89.3 89.5  PLT 248  --  267 214   Basic Metabolic Panel: Recent Labs  Lab 12/03/17 1445 12/04/17 0029 12/04/17 0315 12/06/17 0421 12/09/17 0241 12/10/17 0741  NA 139  140 136 138 138 138  K 4.1 4.2 4.2 4.0 3.9 4.3  CL 108 105 105 107 104 105  CO2 20*  --  21* 22 21* 21*  GLUCOSE 156* 166* 158* 93 84 92  BUN 12 15 14  21* 13 10  CREATININE 1.14 0.90 1.17 1.14 1.19 1.10  CALCIUM 7.9*  --  7.8* 7.6* 8.3* 8.5*  MG 1.7  --  1.9  --   --   --    GFR: Estimated Creatinine Clearance: 72.1 mL/min (by C-G formula based on SCr of 1.1 mg/dL). Liver Function Tests: Recent Labs  Lab 12/04/17 0315  AST 45*  ALT 44  ALKPHOS 53  BILITOT 0.2*  PROT 5.8*  ALBUMIN  2.8*   Recent Labs  Lab 12/04/17 0315  AMYLASE 48   No results for input(s): AMMONIA in the last 168 hours. Coagulation Profile: Recent Labs  Lab 12/03/17 1445  INR 1.17   Cardiac Enzymes: No results for input(s): CKTOTAL, CKMB, CKMBINDEX, TROPONINI in the last 168 hours. BNP (last 3 results) No results for input(s): PROBNP in the last 8760 hours. HbA1C: No results for input(s): HGBA1C in the last 72 hours. CBG: Recent Labs  Lab 12/05/17 2012  GLUCAP 110*   Lipid Profile: No results for input(s): CHOL, HDL, LDLCALC, TRIG, CHOLHDL, LDLDIRECT in the last 72 hours. Thyroid Function Tests: No results for input(s): TSH, T4TOTAL, FREET4, T3FREE, THYROIDAB in the last 72 hours. Anemia Panel: No results for input(s): VITAMINB12, FOLATE, FERRITIN, TIBC, IRON, RETICCTPCT in the last 72 hours. Urine analysis:    Component Value Date/Time   COLORURINE YELLOW 11/26/2017 2136   APPEARANCEUR CLEAR 11/26/2017 2136   LABSPEC 1.024 11/26/2017 2136   PHURINE 6.0 11/26/2017 2136   GLUCOSEU NEGATIVE 11/26/2017 2136   HGBUR NEGATIVE 11/26/2017 2136   BILIRUBINUR NEGATIVE 11/26/2017 2136   KETONESUR NEGATIVE 11/26/2017 2136   PROTEINUR NEGATIVE 11/26/2017 2136   NITRITE NEGATIVE 11/26/2017 2136   LEUKOCYTESUR NEGATIVE 11/26/2017 2136   Sepsis Labs: @LABRCNTIP (procalcitonin:4,lacticidven:4)  ) Recent Results (from the past 240 hour(s))  Surgical PCR screen     Status: Abnormal   Collection Time: 12/02/17 11:42 AM  Result Value Ref Range Status   MRSA, PCR POSITIVE (A) NEGATIVE Final    Comment: RESULT CALLED TO, READ BACK BY AND VERIFIED WITH: Dione PloverB. Grace RN 13:25 12/02/17 ([email or web address])    Staphylococcus aureus POSITIVE (A) NEGATIVE Final    Comment: (NOTE) The Xpert SA Assay (FDA approved for NASAL specimens in patients 65 years of age and older), is one component of a comprehensive surveillance program. It is not intended to diagnose infection nor to guide or monitor  treatment. Performed at [facility], [address] [place], [place] [address]          Radiology Studies: No results found.      Scheduled Meds: . amLODipine  10 mg Oral Daily  . aspirin EC  81 mg Oral Daily  . atorvastatin  10 mg Oral q1800  . docusate  100 mg Per Tube Daily  . enoxaparin (LOVENOX) injection  40 mg Subcutaneous Q24H  . metoprolol succinate  25 mg Oral Daily  . pantoprazole  40 mg Oral Daily  . sodium chloride flush  10-40 mL Intracatheter Q12H   Continuous Infusions: . sodium chloride    . magnesium sulfate 1 - 4 g bolus IVPB    . vancomycin 1,250 mg (12/10/17 1209)     LOS: 14 days    Time spent:     Zannie Cove, MD [facility] Page via [email or web address], password [email or web address] After 7PM please contact night-coverage  12/10/2017, 12:32 PM

## 2017-12-10 NOTE — Progress Notes (Signed)
PT Cancellation Note  Patient Details Name: Austin [name] MRN: [id number] DOB: 06/30/1951   Cancelled Treatment:    Reason Eval/Treat Not Completed: (P) Other (comment)(Pt refusing PT services at this time and awaiting d/c home.  Pt denies PT needs at this time.  )   [name] 12/10/2017, 1:53 PM  [name], PTA pager [phone]

## 2017-12-10 NOTE — Care Management Note (Addendum)
Case Management Note Donn PieriniKristi Toshio Slusher RN, BSN Unit 4E-Case Manager [phone]  Patient Details  Name: Austin DurandStephen Pizana MRN: [id number] Date of Birth: 11/05/1950  Subjective/Objective:  Pt admitted s/p Aortobifemoral bypass grafting                 Action/Plan: PTA pt lived at home with wife- CM spoke with pt regarding transition of care needs- per conversation pt reports that [place] in [place] and not at home much- he has concerns about returning home and "re-adjusted" to home he reports that he wife works 12 shifts and will not be at home to assist him during the day- [facility] with [facility]- per pt [facility] and does not feel like he needs it as he feels like he can go home with wife once she is there on Friday when she is off for the weekend. Pt also does not feel like he will need HH services- and declines and HH referrals at this time- pt states he has a cane at home- does not want a RW or any other DME- Prevena wound VAC to come off at discharge per MD note. Informed pt that it would be up to doctor when pt was medically stable for discharge.   Expected Discharge Date:  12/10/17               Expected Discharge Plan:  Home w Home Health Services  In-House Referral:  Clinical Social Work  Discharge planning Services  CM Consult  Post Acute Care Choice:  Home Health Choice offered to:  Patient  DME Arranged:  N/A DME Agency:  NA  HH Arranged:  PT, OT HH Agency:  [facility]  Status of Service:  Completed, signed off  If discussed at Long Length of Stay Meetings, dates discussed:    Discharge Disposition: home/home health   Additional Comments:  12/10/17- 1100- Nikko Quast RN, CM- pt for d/c home today- orders have been placed for HHPT/OT- pt also needs a PCP- spoke with pt again at bedside regarding HH - [facility]- choice offered however  pt does not have a preference offered [facility]- pt is agreeable. Pt still declining a RW for home and states he will use his cane. Discussed PCP needs- pt is in [facility] and is agreeable to f/u here- call made to clinic and f/u appointment made for April 17 at 9:10- [facility].  [facility]- referral accepted. Pt to return home with wife later today, prevena wound vacs have been removed per PA note.   Darrold SpanWebster, Vlad Mayberry Hall, RN 12/10/2017, 11:20 AM

## 2017-12-10 NOTE — Progress Notes (Addendum)
  Progress Note    12/10/2017 7:10 AM 7 Days Post-Op  Subjective:  No complaints; just woke up  Afebrile HR 50's-100's NSR 120's-150's systolic 94% RA  Vitals:   12/09/17 2313 12/10/17 0436  BP: 132/63 (!) 159/75  Pulse: 77 62  Resp: 12 17  Temp: 98.4 F (36.9 C) 98.5 F (36.9 C)  SpO2: 94% 94%    Physical Exam: General:  No distress Lungs:  Non labored Incisions:  Laparotomy incision is clean and dry; bilateral groins-Pravena vacs were removed and incisions are clean and dry. Extremities:  Bilateral feet are warm Abdomen:  Soft, NT/ND  CBC    Component Value Date/Time   WBC 11.7 (H) 12/06/2017 0421   RBC 3.15 (L) 12/06/2017 0421   HGB 9.3 (L) 12/06/2017 0421   HCT 28.2 (L) 12/06/2017 0421   PLT 214 12/06/2017 0421   MCV 89.5 12/06/2017 0421   MCH 29.5 12/06/2017 0421   MCHC 33.0 12/06/2017 0421   RDW 14.4 12/06/2017 0421   LYMPHSABS 1.5 12/02/2017 0711   MONOABS 0.8 12/02/2017 0711   EOSABS 0.2 12/02/2017 0711   BASOSABS 0.1 12/02/2017 0711    BMET    Component Value Date/Time   NA 138 12/09/2017 0241   K 3.9 12/09/2017 0241   CL 104 12/09/2017 0241   CO2 21 (L) 12/09/2017 0241   GLUCOSE 84 12/09/2017 0241   BUN 13 12/09/2017 0241   CREATININE 1.19 12/09/2017 0241   CALCIUM 8.3 (L) 12/09/2017 0241   GFRNONAA >60 12/09/2017 0241   GFRAA >60 12/09/2017 0241    INR    Component Value Date/Time   INR 1.17 12/03/2017 1445     Intake/Output Summary (Last 24 hours) at 12/10/2017 0710 Last data filed at 12/10/2017 0600 Gross per 24 hour  Intake 2720 ml  Output 3050 ml  Net -330 ml     Assessment:  67 y.o. male is s/p:  Aortobifemoral bypass grafting  7 Days Post-Op  Plan: -pt doing well today -PT worked with pt and recommending HHPT; I talked with the pt and he is ready for discharge today.  Will set up HHPT.  Face to face completed.  Discussed with pt the importance of PT coming out to the house -hypertensive:  Pt on metoprolol and po  amlodipine was added.  Pt was given IV lasix yesterday as well. -removed his Pravena Vacs today and his incisions look great.  Discussed groin wound care in detail with the pt. -DVT prophylaxis:  Lovenox -f/u with Dr. Myra GianottiBrabham in a couple of weeks.  -aspirin 81mg  started today as well as lipitor 10mg  daily -home with Toprol xl 25mg  daily, Norvasc 10mg  daily, aspirin 81mg  daily, lipitor 10mg  daily; percocet 5/325 q6h prn pain -PT WILL NEED PCP SET UP FOR HIM PRIOR TO DISCHARGE FOR FOLLOW UP.    Doreatha MassedSamantha Chanette Demo, PA-C [facility] [phone] 12/10/2017 7:10 AM

## 2017-12-10 NOTE — Discharge Instructions (Signed)
[facility]  Discharge Instructions   Open Aortic Surgery  Please refer to the following instructions for your post-procedure care. Your surgeon or Physician Assistant will discuss any changes with you.  Activity  Avoid lifting more than eight pounds (a gallon of milk) until after your first post-operative visit. You are encouraged to walk as much as you can. You can slowly return to normal activities but must avoid strenuous activity and heavy lifting until your doctor tells you it's okay. Heavy lifting can hurt the incision and cause a hernia. Avoid activities such as vacuuming or swinging a golf club. It is normal to feel tired for several weeks after your surgery. Do not drive until your doctor gives the okay and you are no longer taking prescription pain medications. It is also normal to have difficulty with sleep habits, eating and bowl movements after surgery. These will go away with time.  Bathing/Showering  Shower daily after you go home. Do not soak in a bathtub, hot tub, or swim until the incision heals.  Incision Care  Shower every day. Clean your incision with mild soap and water. Pat the area dry with a clean towel. You do not need a bandage unless otherwise instructed. Do not apply any ointments or creams to your incision. You may have skin glue on your incision. Do not peel it off. It will come off on its own in about one week. If you have staples or sutures along your incision, they will be removed at your post op appointment.  If you have groin incisions, wash the groin wounds with soap and water daily and pat dry. (No tub bath-only shower)  Then put a dry gauze or washcloth in the groin to keep this area dry to help prevent wound infection.  Do this daily and as needed.  Do not use Vaseline or neosporin on your incisions.  Only use soap and water on your incisions and then protect and keep dry.  Keep a dry gauze between your left great and  2nd toe and change daily and as needed.  Diet  Resume your normal diet. There are no special food restriction following this procedure. A low fat/low cholesterol diet is recommended for all patients with vascular disease. After your aortic surgery, it's normal to feel full faster than usual and to not feel as hungry as you normally would. You will probably lose weight initially following your surgery. It's best to eat small, frequent meals over the course of the day. Call the office if you find that you are unable to eat even small meals.   In order to heal from your surgery, it is CRITICAL to get adequate nutrition. Your body requires vitamins, minerals, and protein. Vegetables are the best source of vitamins and minerals. If you have pain, you may take over-the-counter pain reliever such as acetaminophen (Tylenol). If you were prescribed a stronger pain medication, please be aware these medication can cause nausea and constipation. Prevent nausea by taking the medication with a snack or meal. Avoid constipation by drinking plenty of fluids and eating foods with a high amount of fiber, such as fruits, vegetables and grains. Take 100mg  of the over-the-counter stool softener Colace twice a day as needed to help with constipation. A laxative, such as Milk of Magnesia, may be recommended for you at this time. Do not take a laxative unless your surgeon or P.A. tells you it's OK.  Do not take Tylenol if you are  taking stronger pain medications (such as Percocet).  Follow Up  Our office will schedule a follow up appointment 2-3 weeks after discharge.  Please call us immediately for any of the following conditions    .     Severe or worsening pain in your legs or feet or in your abdomen back or chest. Increased pain, redness drainage (pus) from your incision site. Increased abdominal pain, bloating, nausea, vomiting, or persistent diarrhea. Fever of 101 degrees or higher. Swelling in your leg  (s).  Reduce your risk of vascular disease  Stop smoking. If you would like help, call [facility] at 1-800-QUIT-NOW (343-080-4368) or [place] at [phone]. Manage your cholesterol Maintain a desired weight Control your diabetes Keep your blood pressure down  If you have any questions please call the office at [phone].

## 2017-12-10 NOTE — Progress Notes (Signed)
Patient is ready for discharge. Patient has all of his belongings and has had all of his questions regarding discharge answered. Patient IV's have been removed with catheter intact and no complications, he tolerated removal well. Patient has been taken off of telemetry and CCMD has been notified, box removed from patient. He will be transported home by his girlfriend and he will leave the unit by wheelchair and will meet his girlfriend at the front entrance of the hospital.

## 2017-12-10 NOTE — Discharge Summary (Addendum)
AAA Discharge Summary    Artist Bloom 04/07/1951 67 y.o. male  [id number]  Admission Date: 11/26/2017  Discharge Date: 12/10/17  Physician: Nada Libman, MD  Admission Diagnosis: Foot Cellulitis   HPI:   This is a 67 y.o. male who denies any past medical history though notes he does not see a physician, now presenting to the emergency department for evaluation of blackened left great toe with progressive swelling, erythema, and tenderness involving the left foot.  Patient reports that he noted a wound at the left great toe in January, more recently developed swelling, redness, and tenderness involving the left forefoot, was seen in the emergency department for this, and discharged home with doxycycline.  Despite the oral antibiotic, the patient reports continued progressive swelling, erythema, and tenderness of the left forefoot.  He reports some serous drainage from the great toe.  He denies chest pain or palpitations.  Denies fevers or chills.  No known history of diabetes.  ED Course: Upon arrival to the ED, patient is found to be afebrile, saturating well on room air, and hypertensive to 200/90.  Radiographs of the left foot demonstrate diffuse soft tissue swelling involving the forefoot and great toe with no evidence for osteomyelitis, and no change since the prior study.  Chemistry panel and CBC are unremarkable, and lactic acid is reassuringly normal.  Blood cultures were collected, patient was started on IV antibiotics, and orthopedic surgery was consulted by the ED physician.  He remains hypertensive, and no apparent respiratory distress, and will be admitted to the medical-surgical unit for ongoing evaluation and management of left great toe gangrene with surrounding cellulitis.   Hospital Course:  The patient was admitted to the hospital and had an ortho consult.   He did not have pulses bilaterally and a vascular consult was obtained.  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    ABI's on 11/28/17:  RIGHT    LEFT    PRESSURE WAVEFORM  PRESSURE WAVEFORM  BRACHIAL 177 triphasic BRACHIAL 179 triphasic  DP 55 monophasic DP 0 absent  AT   AT    PT 54 monophasic PT 0 absent  PER   PER    GREAT TOE  NA GREAT TOE  NA    RIGHT LEFT  ABI 0.31 0.    He underwent a stress test, which was positive.   Stress test findings noted. Large inferior ischemia. 1-2% MACCE risk for vascular bypass surgery. Continue aspirin, lipitor, metoprolol. If critical coronary stenosis treated with PCI, dual antiplatelet therapy for at least 3-6 months would be necessary. Will discuss with vascular surgery, if that would be acceptable from perioperative bleeding risk standpoint.  He also had a 2D echocardiogram on 11/30/17: Study Conclusions  - Left ventricle: The cavity size was normal. There was moderate   concentric hypertrophy. The estimated ejection fraction was in   the range of 65% to 70%. Mild hypokinesis. Mild hypokinesis of   the midinferior myocardium. Features are consistent with grade II   diastolic dysfunction, elevated LAP. - Left atrium: The atrium was mildly dilated. - Pulmonary arteries: PA peak pressure: 40 mm Hg (S).  Carotid duplex on 11/30/17: Preliminary report:  Right:40-59% ICA stenosis.  Left: 40-59% ICA stenosis, distally.  Unable to insonate vertebral artery flow secondary to patient's need to dangle leg because of ischemic leg pain.  He underwent cardiac catheterization on 12/01/17: Diagnostic  Dominance: Right  Left Main  Vessel  is normal in caliber. Vessel is angiographically normal.  Left Anterior Descending  Vessel is normal in caliber. Vessel is angiographically normal. The vessel is tortuous.  Left Circumflex  Vessel is normal in caliber. Vessel is angiographically normal.  Right Coronary Artery  Vessel is normal in caliber. Vessel is angiographically normal. The  vessel is tortuous.   Also preoperatively, he was treated for hypertensive urgency.   On 12/03/17 he was taken to the operating room on 12/03/2017 and underwent: Procedure:#1:Aortobifemoral bypass graft using a 14 x 7 bifurcated [place] graft #2: Reimplantation of the inferior mesenteric artery #3: Bilateral femoral Praveena wound VAC #4: Aortic endarterectomy     Findings: Proximal anastomosis was end to end.  Femoral anastomosis to the distal common femoral artery slightly out onto the profundofemoral artery bilaterally.  The inferior mesenteric artery was reimplanted on the left lateral side of the tube portion of the aortic graft.  He was hypertensive and required Clevidipine.  He was started on scheduled lopressor.  He was on PCA and given a couple days of IV Toradol.    On POD 3, he was transferred to the telemetry unit.  He was started on clear liquids.    On POD 4, He had palpable DP pulses bilaterally.  He did have increased swelling of BLE with left>right.  A duplex was ordered and was negative for DVT.  He was diuresed post operatively, which helped with swelling. Dry dressing between toes.  Norvasc was added to his regimen.    The remainder of the hospital course consisted of increasing mobilization and increasing intake of solids without difficulty.  CBC    Component Value Date/Time   WBC 11.7 (H) 12/06/2017 0421   RBC 3.15 (L) 12/06/2017 0421   HGB 9.3 (L) 12/06/2017 0421   HCT 28.2 (L) 12/06/2017 0421   PLT 214 12/06/2017 0421   MCV 89.5 12/06/2017 0421   MCH 29.5 12/06/2017 0421   MCHC 33.0 12/06/2017 0421   RDW 14.4 12/06/2017 0421   LYMPHSABS 1.5 12/02/2017 0711   MONOABS 0.8 12/02/2017 0711   EOSABS 0.2 12/02/2017 0711   BASOSABS 0.1 12/02/2017 0711    BMET    Component Value Date/Time   NA 138 12/09/2017 0241   K 3.9 12/09/2017 0241   CL 104 12/09/2017 0241   CO2 21 (L) 12/09/2017  0241   GLUCOSE 84 12/09/2017 0241   BUN 13 12/09/2017 0241   CREATININE 1.19 12/09/2017 0241   CALCIUM 8.3 (L) 12/09/2017 0241   GFRNONAA >60 12/09/2017 0241   GFRAA >60 12/09/2017 0241       Discharge Diagnosis:  Foot Cellulitis  Secondary Diagnosis: Patient Active Problem List   Diagnosis Date Noted  . Aortic occlusion (HCC) 12/03/2017  . Gangrene of toe of left foot (HCC) 11/26/2017  . Hypertension 11/26/2017  . Cellulitis of foot, left 11/26/2017  . Hypertensive urgency    Past Medical History:  Diagnosis Date  . Back pain   . Hypertension      Allergies as of 12/10/2017   No Known Allergies     Medication List    STOP taking these medications   clotrimazole-betamethasone cream Commonly known as:  LOTRISONE   doxycycline 100 MG capsule Commonly known as:  MONODOX   ibuprofen 200 MG tablet Commonly known as:  ADVIL,MOTRIN     TAKE these medications   amLODipine 10 MG tablet Commonly known as:  NORVASC Take 1 tablet (10 mg total) by mouth daily.   aspirin EC  81 MG tablet Take 1 tablet (81 mg total) by mouth daily.   atorvastatin 10 MG tablet Commonly known as:  LIPITOR Take 1 tablet (10 mg total) by mouth daily.   gabapentin 100 MG capsule Commonly known as:  NEURONTIN Take 1 capsule (100 mg total) by mouth 3 (three) times daily.   metoprolol succinate 25 MG 24 hr tablet Commonly known as:  TOPROL-XL Take 1 tablet (25 mg total) by mouth daily.   oxyCODONE-acetaminophen 5-325 MG tablet Commonly known as:  PERCOCET/ROXICET Take 1 tablet by mouth every 6 (six) hours as needed for moderate pain or severe pain.       Instructions:  [facility] Discharge Instructions Open Aortic Surgery  Please refer to the following instructions for your post-procedure care. Your surgeon or Physician Assistant will discuss any changes with you.  Activity  Avoid lifting more than eight pounds (a gallon of milk) until after  your first post-operative visit. You are encouraged to walk as much as you can. You can slowly return to normal activities but must avoid strenuous activity and heavy lifting until your doctor tells you it's OK. Heavy lifting can hurt the incision and cause a hernia. Avoid activities such as vacuuming or swinging a golf club. It is normal to feel tired for several weeks after your surgery. Do not drive until your doctor gives the OK and you are no longer taking prescription pain medications. It is also normal to have difficulty with sleep habits, eating and bowl movements after surgery. These will go away with time.  Bathing/Showering  You may shower after you go home. Do not soak in a bathtub, hot tub, or swim until the incision heals.  Incision Care  Shower every day. Clean your incision with mild soap and water. Pat the area dry with a clean towel. You do not need a bandage unless otherwise instructed. Do not apply any ointments or creams to your incision. You may have skin glue on your incision. Do not peel it off. It will come off on its own in about one week. If you have staples or sutures along your incision, they will be removed at your post op appointment.  If you have groin incisions, wash the groin wounds with soap and water daily and pat dry. (No tub bath-only shower)  Then put a dry gauze or washcloth in the groin to keep this area dry to help prevent wound infection.  Do this daily and as needed.  Do not use Vaseline or neosporin on your incisions.  Only use soap and water on your incisions and then protect and keep dry.  Diet  Resume your normal diet. There are no special food restriction following this procedure. A low fat/low cholesterol diet is recommended for all patients with vascular disease. After your aortic surgery, it's normal to feel full faster than usual and to not feel as hungry as you normally would. You will probably lose weight initially following your surgery. It's best  to eat small, frequent meals over the course of the day. Call the office if you find that you are unable to eat even small meals. In order to heal from your surgery, it is CRITICAL to get adequate nutrition. Your body requires vitamins, minerals, and protein. Vegetables are the best source of vitamins and minerals. causing pain, you may take over-the-counter pain reliever such as acetaminophen (Tylenol). If you were prescribed a stronger pain medication, please be aware these medication can cause  nausea and constipation. Prevent nausea by taking the medication with a snack or meal. Avoid constipation by drinking plenty of fluids and eating foods with a high amount of fiber, such as fruits, vegetables and grains. Take 100mg  of the over-the-counter stool softener Colace twice a day as needed to help with constipation. A laxative, such as Milk of Magnesia, may be recommended for you at this time. Do not take a laxative unless your surgeon or Physician Assistant. tells you it's OK. Do not take Tylenol if you are taking stronger pain medications (such as Percocet).  Follow Up  Our office will schedule a follow up appointment 2-3 weeks after discharge.  Please call us immediately for any of the following conditions    .     Severe or worsening pain in your legs or feet or in your abdomen back or chest. Increased pain, redness drainage (pus) from your incision site. Increased abdominal pain, bloating, nausea, vomiting, or persistent diarrhea. Fever of 101 degrees or higher. Swelling in your leg (s).  Reduce your risk of vascular disease  Stop smoking. If you would like help, call [facility] at 1-800-QUIT-NOW ((639)291-29751-(904)641-3219) or [place] at [phone]. Manage your cholesterol Maintain a desired weight Control your diabetes Keep your blood pressure down  If you have any questions please call the office at [phone].   Prescriptions given: 1.  Roxicet #30 No Refill 2.  Aspirin 81mg  daily  (OTC) 3.  Toprol XL 25mg  daily #30 2RF (refills per PCP) 4.  Norvasc 10mg  daily #30 2RF (refills per PCP) 5.  Lipitor 10mg  qhs #30 2RF  Disposition: home with HHPT/OT  Patient's condition: is Good  Follow up: 1. Dr. Myra GianottiBrabham in 2 weeks 2. Will need f/u with PCP   Doreatha MassedSamantha Bevan Disney, PA-C [facility] [phone] 12/10/2017  7:48 AM   - For VQI Registry use -  Post-op:  Time to Extubation: [x]  In OR, [ ]  < 12 hrs, [ ]  12-24 hrs, [ ]  >=24 hrs Vasopressors Req. Post-op: No ICU Stay:  day in ICU Transfusion: No   If yes, n/a units given MI: No, [ ]  Troponin only, [ ]  EKG or Clinical New Arrhythmia: No  Complications: CHF: No Resp failure: No, [ ]  Pneumonia, [ ]  Ventilator Chg in renal function: No, [ ]  Inc. Cr > 0.5, [ ]  Temp. Dialysis, [ ]  Permanent dialysis Leg ischemia: No, no Surgery needed, [ ]  Yes, Surgery needed, [ ]  Amputation Bowel ischemia: No, [ ]  Medical Rx, [ ]  Surgical Rx Wound complication: No, [ ]  Superficial separation/infection, [ ]  Return to OR Return to OR: No  Return to OR for bleeding: No Stroke: No, [ ]  Minor, [ ]  Major  Discharge medications: Statin use:  Yes If No:   ASA use:  Yes  If No:   Plavix use:  No  Beta blocker use:  Yes  ACEI use:  No ARB use:  No CCB use:  Yes. Coumadin use:  Yes

## 2017-12-31 ENCOUNTER — Emergency Department (HOSPITAL_COMMUNITY): Payer: Medicare Other

## 2017-12-31 ENCOUNTER — Emergency Department (HOSPITAL_COMMUNITY)
Admission: EM | Admit: 2017-12-31 | Discharge: 2017-12-31 | Disposition: A | Discharge status: Home / Self Care | Payer: Medicare Other | Attending: Emergency Medicine | Admitting: Emergency Medicine

## 2017-12-31 ENCOUNTER — Encounter (INDEPENDENT_AMBULATORY_CARE_PROVIDER_SITE_OTHER): Payer: Self-pay | Admitting: Nurse Practitioner

## 2017-12-31 ENCOUNTER — Other Ambulatory Visit: Payer: Self-pay

## 2017-12-31 ENCOUNTER — Ambulatory Visit (INDEPENDENT_AMBULATORY_CARE_PROVIDER_SITE_OTHER): Payer: Medicare Other | Admitting: Nurse Practitioner

## 2017-12-31 ENCOUNTER — Encounter (HOSPITAL_COMMUNITY): Payer: Self-pay | Admitting: Emergency Medicine

## 2017-12-31 ENCOUNTER — Emergency Department (HOSPITAL_BASED_OUTPATIENT_CLINIC_OR_DEPARTMENT_OTHER)
Admit: 2017-12-31 | Discharge: 2017-12-31 | Disposition: A | Discharge status: Home / Self Care | Payer: Medicare Other | Attending: Emergency Medicine | Admitting: Emergency Medicine

## 2017-12-31 VITALS — BP 149/88 | HR 88 | Temp 98.2°F | Ht 65.5 in | Wt 179.6 lb

## 2017-12-31 DIAGNOSIS — F17210 Nicotine dependence, cigarettes, uncomplicated: Secondary | ICD-10-CM | POA: Diagnosis not present

## 2017-12-31 DIAGNOSIS — S91102A Unspecified open wound of left great toe without damage to nail, initial encounter: Secondary | ICD-10-CM | POA: Diagnosis not present

## 2017-12-31 DIAGNOSIS — L03032 Cellulitis of left toe: Secondary | ICD-10-CM

## 2017-12-31 DIAGNOSIS — I96 Gangrene, not elsewhere classified: Secondary | ICD-10-CM

## 2017-12-31 DIAGNOSIS — M79675 Pain in left toe(s): Secondary | ICD-10-CM | POA: Diagnosis not present

## 2017-12-31 DIAGNOSIS — Z96643 Presence of artificial hip joint, bilateral: Secondary | ICD-10-CM | POA: Insufficient documentation

## 2017-12-31 DIAGNOSIS — I10 Essential (primary) hypertension: Secondary | ICD-10-CM | POA: Insufficient documentation

## 2017-12-31 DIAGNOSIS — Z79899 Other long term (current) drug therapy: Secondary | ICD-10-CM | POA: Insufficient documentation

## 2017-12-31 DIAGNOSIS — Z7982 Long term (current) use of aspirin: Secondary | ICD-10-CM | POA: Diagnosis not present

## 2017-12-31 DIAGNOSIS — M7989 Other specified soft tissue disorders: Secondary | ICD-10-CM | POA: Diagnosis not present

## 2017-12-31 LAB — COMPREHENSIVE METABOLIC PANEL
ALBUMIN: 3.6 g/dL (ref 3.5–5.0)
ALT: 17 U/L (ref 17–63)
ANION GAP: 11 (ref 5–15)
AST: 17 U/L (ref 15–41)
Alkaline Phosphatase: 80 U/L (ref 38–126)
BILIRUBIN TOTAL: 0.4 mg/dL (ref 0.3–1.2)
BUN: 16 mg/dL (ref 6–20)
CO2: 20 mmol/L — ABNORMAL LOW (ref 22–32)
Calcium: 9 mg/dL (ref 8.9–10.3)
Chloride: 107 mmol/L (ref 101–111)
Creatinine, Ser: 0.98 mg/dL (ref 0.61–1.24)
Glucose, Bld: 94 mg/dL (ref 65–99)
POTASSIUM: 3.7 mmol/L (ref 3.5–5.1)
Sodium: 138 mmol/L (ref 135–145)
TOTAL PROTEIN: 7 g/dL (ref 6.5–8.1)

## 2017-12-31 LAB — CBC WITH DIFFERENTIAL/PLATELET
BASOS ABS: 0.1 10*3/uL (ref 0.0–0.1)
Basophils Relative: 1 %
Eosinophils Absolute: 0.3 10*3/uL (ref 0.0–0.7)
Eosinophils Relative: 4 %
HEMATOCRIT: 40.4 % (ref 39.0–52.0)
Hemoglobin: 13.2 g/dL (ref 13.0–17.0)
LYMPHS PCT: 33 %
Lymphs Abs: 2.4 10*3/uL (ref 0.7–4.0)
MCH: 28.7 pg (ref 26.0–34.0)
MCHC: 32.7 g/dL (ref 30.0–36.0)
MCV: 87.8 fL (ref 78.0–100.0)
Monocytes Absolute: 0.6 10*3/uL (ref 0.1–1.0)
Monocytes Relative: 8 %
NEUTROS ABS: 4.1 10*3/uL (ref 1.7–7.7)
NEUTROS PCT: 54 %
PLATELETS: 242 10*3/uL (ref 150–400)
RBC: 4.6 MIL/uL (ref 4.22–5.81)
RDW: 14.2 % (ref 11.5–15.5)
WBC: 7.5 10*3/uL (ref 4.0–10.5)

## 2017-12-31 LAB — I-STAT CG4 LACTIC ACID, ED: Lactic Acid, Venous: 1.04 mmol/L (ref 0.5–1.9)

## 2017-12-31 MED ORDER — TADALAFIL 20 MG PO TABS: 20.0000 mg | ORAL_TABLET | Freq: Every day | ORAL | 0 refills | Status: DC | PRN

## 2017-12-31 MED ORDER — SULFAMETHOXAZOLE-TRIMETHOPRIM 800-160 MG PO TABS: 1.0000 | ORAL_TABLET | Freq: Two times a day (BID) | ORAL | 0 refills | Status: AC

## 2017-12-31 MED ORDER — OXYCODONE-ACETAMINOPHEN 5-325 MG PO TABS
1.0000 | ORAL_TABLET | ORAL | Status: AC | PRN
  Administered 2017-12-31 (×2): 1 via ORAL
  Filled 2017-12-31 (×2): qty 1

## 2017-12-31 MED ORDER — OXYCODONE-ACETAMINOPHEN 5-325 MG PO TABS: 1.0000 | ORAL_TABLET | Freq: Four times a day (QID) | ORAL | 0 refills | Status: DC | PRN

## 2017-12-31 NOTE — Consult Note (Signed)
[facility]  Patient name: Austin DurandStephen [name] MRN: [id number] DOB: 01/05/1951 Sex: male   REASON FOR VISIT:    Left toe  HISOTRY OF PRESENT ILLNESS:    Austin DurandStephen Austin [name] is a 67 y.o. 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  This was done for a gangrenous left great toe which has been present for approximately 2 months.  We elected to let the toe demarcate and see if it had an option for improving after his bypass surgery.  He was sent to the ER by home health because the toe has not improved and there was a question of purulent drainage.  He denies any fevers or chills.  He states that the toe has not gotten any better.   PAST MEDICAL HISTORY:   Past Medical History:  Diagnosis Date  . Back pain   . Hypertension      FAMILY HISTORY:   History reviewed. No pertinent family history.  SOCIAL HISTORY:   Social History   Tobacco Use  . Smoking status: Current Every Day Smoker  . Smokeless tobacco: Never Used  Substance Use Topics  . Alcohol use: No     ALLERGIES:   No Known Allergies   CURRENT MEDICATIONS:   No current facility-administered medications for this encounter.    Current Outpatient Medications  Medication Sig Dispense Refill  . amLODipine (NORVASC) 10 MG tablet Take 1 tablet (10 mg total) by mouth daily. 30 tablet 2  . atorvastatin (LIPITOR) 10 MG tablet Take 1 tablet (10 mg total) by mouth daily. 30 tablet 2  . gabapentin (NEURONTIN) 100 MG capsule Take 1 capsule (100 mg total) by mouth 3 (three) times daily. 60 capsule 0  . metoprolol succinate (TOPROL-XL) 25 MG 24 hr tablet Take 1 tablet (25 mg total) by mouth daily. 30 tablet 2  . naproxen sodium (ALEVE) 220 MG tablet Take 220 mg by mouth 2 (two) times daily as needed (pain).    Marland Kitchen. aspirin EC 81 MG tablet Take 1 tablet (81 mg total) by mouth daily. (Patient not taking: Reported on 12/31/2017)    . oxyCODONE-acetaminophen (PERCOCET)  5-325 MG tablet Take 1 tablet by mouth every 6 (six) hours as needed. 20 tablet 0  . sulfamethoxazole-trimethoprim (BACTRIM DS,SEPTRA DS) 800-160 MG tablet Take 1 tablet by mouth 2 (two) times daily for 7 days. 14 tablet 0  . tadalafil (CIALIS) 20 MG tablet Take 1 tablet (20 mg total) by mouth daily as needed for erectile dysfunction. 10 tablet 0    REVIEW OF SYSTEMS:   [X]  denotes positive finding, [ ]  denotes negative finding Cardiac  Comments:  Chest pain or chest pressure:    Shortness of breath upon exertion:    Short of breath when lying flat:    Irregular heart rhythm:        Vascular    Pain in calf, thigh, or hip brought on by ambulation:    Pain in feet at night that wakes you up from your sleep:     Blood clot in your veins:    Leg swelling:  x       Pulmonary    Oxygen at home:    Productive cough:     Wheezing:         Neurologic    Sudden weakness in arms or legs:     Sudden numbness in arms or legs:     Sudden onset of difficulty speaking or slurred speech:  Temporary loss of vision in one eye:     Problems with dizziness:         Gastrointestinal    Blood in stool:     Vomited blood:         Genitourinary    Burning when urinating:     Blood in urine:        Psychiatric    Major depression:         Hematologic    Bleeding problems:    Problems with blood clotting too easily:        Skin    Rashes or ulcers: x       Constitutional    Fever or chills:      PHYSICAL EXAM:   Vitals:   12/31/17 1415 12/31/17 1430 12/31/17 1503 12/31/17 1756  BP: (!) 159/86 (!) 174/90 (!) 180/87 (!) 170/105  Pulse:    80  Resp: 13 13 (!) 21 16  Temp:      TempSrc:      SpO2:    97%    GENERAL: The patient is a well-nourished male, in no acute distress. The vital signs are documented above. CARDIAC: There is a regular rate and rhythm.  VASCULAR: Palpable femoral pulses PULMONARY: Non-labored respirations ABDOMEN: Soft and non-tender.  Midline incision  is well-healed without hernia MUSCULOSKELETAL: There are no major deformities or cyanosis. NEUROLOGIC: No focal weakness or paresthesias are detected. SKIN: Necrotic left second toe with opening in the crease between the first and second toe.  There is drainage but no obvious pus. PSYCHIATRIC: The patient has a normal affect.  STUDIES:         +-------+-----------+-----------+------------+------------+ ABI/TBIToday's ABIToday's TBIPrevious ABIPrevious TBI +-------+-----------+-----------+------------+------------+ Right 1.12    0.7    0.31           +-------+-----------+-----------+------------+------------+ Left  0.79          0             +-------+-----------+-----------+------------+------------+  MEDICAL ISSUES:   Gangrene, left great toe: I discussed with the patient that I believe he has failed attempts at trying to salvage the toe and that he needs to proceed with amputation.  This is not an emergent situation as he is not toxic.  He would like to wait until after the Easter holiday.  I told him that I am not available next week but would get 1 of my partners to do his amputation.  I told him that this may also require removal of the left second toe.  His ABI on the left is 0.79.  He likely has disease within the superficial femoral artery which is why his ABIs is not normal.  I suspect however that he has adequate blood flow to heal a toe amputation site.  I will have my office contact him to schedule his amputation next week.  He is in agreement with this.  I will place him on antibiotics in the interim.  He was also given pain medication as well as Cialis at his request.    Durene Cal, MD [facility] [phone] Pager [phone]

## 2017-12-31 NOTE — ED Notes (Signed)
Pt in radiology 

## 2017-12-31 NOTE — Progress Notes (Addendum)
ABI's have been completed. Right 1.12 Left 0.79  Results were given to Dr. Eudelia Bunchardama.  12/31/17 4:00 PM Olen CordialGreg Mitchelle Goerner RVT

## 2017-12-31 NOTE — ED Provider Notes (Signed)
Pt seen by Dr Eudelia Bunchardama.  Please see his note.    7 day opiate supply on 3/27 noted on PMP aware  Pt is stable for discharge.  Will follow up as an outpatient.   Linwood DibblesKnapp, Laiba Fuerte, MD 12/31/17 [phone]

## 2017-12-31 NOTE — ED Notes (Signed)
Dr. Cardama at bedside.  

## 2017-12-31 NOTE — ED Notes (Signed)
Pt given food and drink.

## 2017-12-31 NOTE — Progress Notes (Signed)
Assessment & Plan:  Jeannett SeniorStephen was seen today for hospitalization follow-up.  Diagnoses and all orders for this visit:  Cellulitis of toe of left foot Patient was instructed to drive to the ED for further evaluation.   Patient has been counseled on age-appropriate routine health concerns for screening and prevention. These are reviewed and up-to-date. Referrals have been placed accordingly. Immunizations are up-to-date or declined.    Subjective:   Chief Complaint  Patient presents with  . Hospitalization Follow-up    cellulitis of toe    HPI Doran DurandStephen Frame 67 y.o. male presents to office today for hospital follow up.  Cellulitis Patient was admitted to the hospital on 11/26/2017 with left foot cellulitis. He had previously been seen in the emergency department prior to his hospital admission and had been treated for the same cellulitis with doxycycline however he failed conservative management and was therefore admitted.  Radiographs and demonstrated diffuse soft tissue swelling involving the forefoot and great toe.  He was initially treated for left great toe gangrene with surrounding cellulitis however CTA revealed aortic occlusion and he required aortobifemoral bypass grafting.  During his hospital admission he also underwent a stress test which was positive and notable for large inferior ischemia.  He underwent cardiac catheter on 12/01/2017 with no coronary intervention postop. He is currently in the office today with complaints of Left great toe pain. Currently I am able to express significant purulent and serosanguinous drainage from the toe. His foot is warm to touch. I am unable to palpate his DPP at this time due to swelling. At this time I do not feel comfortable treating patient with oral antibiotics due to severity of possible infection. I have instructed the patient and his wife to go to the ED for further evaluation and treatment. I have also notified Dr. Estanislado SpireBrabham's office of his  office visit here today.    Review of Systems  Constitutional: Negative for fever, malaise/fatigue and weight loss.  Eyes: Negative.  Negative for blurred vision, double vision and photophobia.  Respiratory: Negative.  Negative for cough and shortness of breath.   Cardiovascular: Negative.  Negative for chest pain, palpitations and leg swelling.  Gastrointestinal: Negative.  Negative for heartburn, nausea and vomiting.  Neurological: Negative.  Negative for dizziness, focal weakness, seizures and headaches.  Psychiatric/Behavioral: Negative.  Negative for suicidal ideas.    Past Medical History:  Diagnosis Date  . Back pain   . Hypertension     Past Surgical History:  Procedure Laterality Date  . AORTA - BILATERAL FEMORAL ARTERY BYPASS GRAFT N/A 12/03/2017   Procedure: AORTA BIFEMORAL BYPASS GRAFT USING 17X7 MM X 40CM HEMASHIELD GOLD GRAFT REIMPLANTATION IMA;  Surgeon: Nada LibmanBrabham, Vance W, MD;  Location: [facility] OR;  Service: Vascular;  Laterality: N/A;  . APPLICATION OF WOUND VAC Bilateral 12/03/2017   Procedure: APPLICATION OF WOUND VAC;  Surgeon: Nada LibmanBrabham, Vance W, MD;  Location: [facility] OR;  Service: Vascular;  Laterality: Bilateral;  . BACK SURGERY    . LEFT HEART CATH AND CORONARY ANGIOGRAPHY N/A 12/01/2017   Procedure: LEFT HEART CATH AND CORONARY ANGIOGRAPHY;  Surgeon: Elder NegusPatwardhan, Manish J, MD;  Location: [facility] INVASIVE CV LAB;  Service: Cardiovascular;  Laterality: N/A;  . TOTAL HIP ARTHROPLASTY     bil    No family history on file.  Social History Reviewed with no changes to be made today.   No facility-administered medications prior to visit.    Outpatient Medications Prior to Visit  Medication Sig Dispense Refill  .  amLODipine (NORVASC) 10 MG tablet Take 1 tablet (10 mg total) by mouth daily. 30 tablet 2  . atorvastatin (LIPITOR) 10 MG tablet Take 1 tablet (10 mg total) by mouth daily. 30 tablet 2  . gabapentin (NEURONTIN) 100 MG capsule Take 1 capsule (100 mg total) by mouth 3  (three) times daily. 60 capsule 0  . metoprolol succinate (TOPROL-XL) 25 MG 24 hr tablet Take 1 tablet (25 mg total) by mouth daily. 30 tablet 2  . oxyCODONE-acetaminophen (PERCOCET/ROXICET) 5-325 MG tablet Take 1 tablet by mouth every 6 (six) hours as needed for moderate pain or severe pain. (Patient not taking: Reported on 12/31/2017) 30 tablet 0  . aspirin EC 81 MG tablet Take 1 tablet (81 mg total) by mouth daily. (Patient not taking: Reported on 12/31/2017)      No Known Allergies     Objective:    BP (!) 149/88 (BP Location: Right Arm, Patient Position: Sitting, Cuff Size: Normal)   Pulse 88   Temp 98.2 F (36.8 C) (Oral)   Ht 5' 5.5" (1.664 m)   Wt 179 lb 9.6 oz (81.5 kg)   SpO2 98%   BMI 29.43 kg/m  Wt Readings from Last 3 Encounters:  12/31/17 179 lb 9.6 oz (81.5 kg)  12/10/17 200 lb 12.8 oz (91.1 kg)  09/29/17 195 lb (88.5 kg)    Physical Exam  Constitutional: He is oriented to person, place, and time. He appears well-developed and well-nourished. He is cooperative.  HENT:  Head: Normocephalic and atraumatic.  Cardiovascular: Normal rate, regular rhythm and normal heart sounds. Exam reveals no gallop and no friction rub.  No murmur heard. Pulmonary/Chest: Effort normal and breath sounds normal. No stridor. No tachypnea. No respiratory distress. He has no decreased breath sounds. He has no wheezes. He has no rhonchi. He has no rales. He exhibits no tenderness.  Musculoskeletal: Normal range of motion. He exhibits edema and tenderness. He exhibits no deformity.       Left foot: There is tenderness and swelling.       Feet:  Neurological: He is alert and oriented to person, place, and time. Coordination normal.  Skin: Skin is warm and dry.  Psychiatric: He has a normal mood and affect. His behavior is normal. Judgment and thought content normal.  Nursing note and vitals reviewed.        Patient has been counseled extensively about nutrition and exercise as well as  the importance of adherence with medications and regular follow-up. The patient was given clear instructions to go to ER or return to medical center if symptoms don't improve, worsen or new problems develop. The patient verbalized understanding.   Follow-up: No follow-ups on file.   Claiborne Rigg, FNP-BC [facility] [place], [place] [phone]   12/31/2017, 12:24 PM

## 2017-12-31 NOTE — ED Provider Notes (Signed)
[facility] Provider Note  CSN: [id number] Arrival date & time: 12/31/17 1006  Chief Complaint(s) Toe Pain  HPI Austin [name] is a 67 y.o. 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  Patient reports that swelling in his legs has significantly improved however the necrotic toe has not improved.  He reports that he has been having purulence draining from a wound on the inner webspace between the great toe and the second toe.  He denies any associated erythema.  Denies any fevers or chills.  Reports that the pain  to the toe is exacerbated with ambulation.  Improved with immobility.  He denies any additional trauma.  States that he has been doing well after his operation and his wounds have been healing.  Denies any abdominal or leg pain.  No chest pain or shortness of breath.  Denies any other physical complaints.    HPI  Past Medical History Past Medical History:  Diagnosis Date  . Back pain   . Hypertension    Patient Active Problem List   Diagnosis Date Noted  . Aortic occlusion (HCC) 12/03/2017  . Gangrene of toe of left foot (HCC) 11/26/2017  . Hypertension 11/26/2017  . Cellulitis of foot, left 11/26/2017  . Hypertensive urgency    Home Medication(s) Prior to Admission medications   Medication Sig Start Date End Date Taking? Authorizing Provider  amLODipine (NORVASC) 10 MG tablet Take 1 tablet (10 mg total) by mouth daily. 12/10/17  Yes Rhyne, Samantha J, PA-C  atorvastatin (LIPITOR) 10 MG tablet Take 1 tablet (10 mg total) by mouth daily. 12/10/17  Yes Rhyne, Samantha J, PA-C  gabapentin (NEURONTIN) 100 MG capsule Take 1 capsule (100 mg total) by mouth 3 (three) times daily. 10/08/17  Yes Street, KanevilleMercedes, PA-C  metoprolol succinate (TOPROL-XL) 25 MG 24  hr tablet Take 1 tablet (25 mg total) by mouth daily. 12/10/17  Yes Rhyne, Samantha J, PA-C  naproxen sodium (ALEVE) 220 MG tablet Take 220 mg by mouth 2 (two) times daily as needed (pain).   Yes [provider]  aspirin EC 81 MG tablet Take 1 tablet (81 mg total) by mouth daily. Patient not taking: Reported on 12/31/2017 12/10/17   Dara Lordshyne, Samantha J, PA-C  oxyCODONE-acetaminophen (PERCOCET/ROXICET) 5-325 MG tablet Take 1 tablet by mouth every 6 (six) hours as needed for moderate pain or severe pain. Patient not taking: Reported on 12/31/2017 12/10/17   Dara Lordshyne, Samantha J, PA-C                                                                                                                                    Past Surgical History Past Surgical History:  Procedure Laterality Date  . AORTA - BILATERAL FEMORAL ARTERY BYPASS  GRAFT N/A 12/03/2017   Procedure: AORTA BIFEMORAL BYPASS GRAFT USING 17X7 MM X 40CM HEMASHIELD GOLD GRAFT REIMPLANTATION IMA;  Surgeon: Nada Libman, MD;  Location: [facility] OR;  Service: Vascular;  Laterality: N/A;  . APPLICATION OF WOUND VAC Bilateral 12/03/2017   Procedure: APPLICATION OF WOUND VAC;  Surgeon: Nada Libman, MD;  Location: [facility] OR;  Service: Vascular;  Laterality: Bilateral;  . BACK SURGERY    . LEFT HEART CATH AND CORONARY ANGIOGRAPHY N/A 12/01/2017   Procedure: LEFT HEART CATH AND CORONARY ANGIOGRAPHY;  Surgeon: Elder Negus, MD;  [facility]: [facility] INV[facility] CV LAB;  Service: Cardiovascular;  Laterality: N/A;  . TOTAL HIP ARTHROPLASTY     bil   Family History History reviewed. No pertinent family history.  Social History Social History   Tobacco Use  . Smoking status: Current Every Day Smoker  . Smokeless tobacco: Never Used  Substance Use Topics  . Alcohol use: No  . Drug use: Yes    Types: Marijuana   Allergies Patient has no known allergies.  Review of Systems Review of Systems All other systems are reviewed and are negative for acute  change except as noted in the HPI  Physical Exam Vital Signs  I have reviewed the triage vital signs BP (!) 166/90   Pulse 80   Temp 98.7 F (37.1 C) (Oral)   Resp 10   SpO2 100%   Physical Exam  Constitutional: He is oriented to person, place, and time. He appears well-developed and well-nourished. No distress.  HENT:  Head: Normocephalic and atraumatic.  Nose: Nose normal.  Eyes: Pupils are equal, round, and reactive to light. Conjunctivae and EOM are normal. Right eye exhibits no discharge. Left eye exhibits no discharge. No scleral icterus.  Neck: Normal range of motion. Neck supple.  Cardiovascular: Normal rate and regular rhythm. Exam reveals no gallop and no friction rub.  No murmur heard. Pulses:      Radial pulses are 2+ on the right side, and 2+ on the left side.       Dorsalis pedis pulses are 0 on the right side, and 0 on the left side.       Posterior tibial pulses are 1+ on the right side, and 1+ on the left side.  Pulmonary/Chest: Effort normal and breath sounds normal. No stridor. No respiratory distress. He has no rales.  Abdominal: Soft. He exhibits no distension. There is no tenderness.    Musculoskeletal: He exhibits no edema or tenderness.       Feet:  Neurological: He is alert and oriented to person, place, and time.  Skin: Skin is warm and dry. No rash noted. He is not diaphoretic. No erythema.  Psychiatric: He has a normal mood and affect.  Vitals reviewed.       ED Results and Treatments Labs (all labs ordered are listed, but only abnormal results are displayed) Labs Reviewed  COMPREHENSIVE METABOLIC PANEL - Abnormal; Notable for the following components:      Result Value   CO2 20 (*)    All other components within normal limits  CBC WITH DIFFERENTIAL/PLATELET  I-STAT CG4 LACTIC ACID, ED  I-STAT CG4 LACTIC ACID, ED  EKG  EKG Interpretation  Date/Time:    Ventricular Rate:    PR Interval:    QRS Duration:   QT Interval:    QTC Calculation:   R Axis:     Text Interpretation:        Radiology Dg Foot 2 Views Left  Result Date: 12/31/2017 CLINICAL DATA:  Foot pain.  Open wound on the great toe. EXAM: LEFT FOOT - 2 VIEW COMPARISON:  Radiographs dated 11/26/2017, 11/19/2017 and 09/29/2017 FINDINGS: There is gas in the soft tissues of great toe. There is no bone destruction or periosteal reaction or other sign of osteomyelitis. 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 Osteoarthritis of the first TMT joint. Arterial vascular calcifications at the ankle. IMPRESSION: 1. No evidence of osteomyelitis or other acute bone abnormality. 2. Gas in the soft tissues of the distal great toe suggesting gangrene. Electronically Signed   By: Francene Boyers M.D.   On: 12/31/2017 13:06   Pertinent labs & imaging results that were available during my care of the patient were reviewed by me and considered in my medical decision making (see chart for details).  Medications Ordered in ED Medications  oxyCODONE-acetaminophen (PERCOCET/ROXICET) 5-325 MG per tablet 1 tablet (1 tablet Oral Given 12/31/17 1044)                                                                                                                                    Procedures Procedures  (including critical care time)  Medical Decision Making / ED Course I have reviewed the nursing notes for this encounter and the patient's prior records (if available in EHR or on provided paperwork).    Left toe gangrene which is mostly dry however there is portion of wet gangrene on the proximal medial aspect of the left great toe.  No surrounding cellulitis or tenderness to palpation.  Plain film with out evidence of osteomyelitis.  Labs without leukocytosis.   No electrolyte derangements or renal insufficiency.  Lactic acid within normal limits.  Will obtain ABIs  Consulted vascular surgery who will send PA to evaluate the patient in the emergency department and provide further recommendations.   Patient care turned over to Dr Lynelle Doctor at 1600. Patient case and results discussed in detail; please see their note for further ED managment.     Final Clinical Impression(s) / ED Diagnoses Final diagnoses:  Gangrenous toe (HCC)      This chart was dictated using voice recognition software.  Despite best efforts to proofread,  errors can occur which can change the documentation meaning.   Nira Conn, MD 12/31/17 [phone]

## 2017-12-31 NOTE — Discharge Instructions (Addendum)
Follow up with Dr Myra GianottiBrabham as scheduled

## 2017-12-31 NOTE — ED Triage Notes (Addendum)
PT states sent here for evaluation of left greart toe. States he needs MD Brabham paged by MD. Pt states he had surgery on his heart but is here for toe pain. Pt states he had a heart valve fixed and they thought that would fix his toe. Great toe looks necrotic with active drainage.

## 2017-12-31 NOTE — H&P (View-Only) (Signed)
[facility]  Patient name: Austin [name] MRN: [id number] DOB: 01/05/1951 Sex: male   REASON FOR VISIT:    Left toe  HISOTRY OF PRESENT ILLNESS:    Austin DurandStephen Magda is a 67 y.o. 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  This was done for a gangrenous left great toe which has been present for approximately 2 months.  We elected to let the toe demarcate and see if it had an option for improving after his bypass surgery.  He was sent to the ER by home health because the toe has not improved and there was a question of purulent drainage.  He denies any fevers or chills.  He states that the toe has not gotten any better.   PAST MEDICAL HISTORY:   Past Medical History:  Diagnosis Date  . Back pain   . Hypertension      FAMILY HISTORY:   History reviewed. No pertinent family history.  SOCIAL HISTORY:   Social History   Tobacco Use  . Smoking status: Current Every Day Smoker  . Smokeless tobacco: Never Used  Substance Use Topics  . Alcohol use: No     ALLERGIES:   No Known Allergies   CURRENT MEDICATIONS:   No current facility-administered medications for this encounter.    Current Outpatient Medications  Medication Sig Dispense Refill  . amLODipine (NORVASC) 10 MG tablet Take 1 tablet (10 mg total) by mouth daily. 30 tablet 2  . atorvastatin (LIPITOR) 10 MG tablet Take 1 tablet (10 mg total) by mouth daily. 30 tablet 2  . gabapentin (NEURONTIN) 100 MG capsule Take 1 capsule (100 mg total) by mouth 3 (three) times daily. 60 capsule 0  . metoprolol succinate (TOPROL-XL) 25 MG 24 hr tablet Take 1 tablet (25 mg total) by mouth daily. 30 tablet 2  . naproxen sodium (ALEVE) 220 MG tablet Take 220 mg by mouth 2 (two) times daily as needed (pain).    Marland Kitchen. aspirin EC 81 MG tablet Take 1 tablet (81 mg total) by mouth daily. (Patient not taking: Reported on 12/31/2017)    . oxyCODONE-acetaminophen (PERCOCET)  5-325 MG tablet Take 1 tablet by mouth every 6 (six) hours as needed. 20 tablet 0  . sulfamethoxazole-trimethoprim (BACTRIM DS,SEPTRA DS) 800-160 MG tablet Take 1 tablet by mouth 2 (two) times daily for 7 days. 14 tablet 0  . tadalafil (CIALIS) 20 MG tablet Take 1 tablet (20 mg total) by mouth daily as needed for erectile dysfunction. 10 tablet 0    REVIEW OF SYSTEMS:   [X]  denotes positive finding, [ ]  denotes negative finding Cardiac  Comments:  Chest pain or chest pressure:    Shortness of breath upon exertion:    Short of breath when lying flat:    Irregular heart rhythm:        Vascular    Pain in calf, thigh, or hip brought on by ambulation:    Pain in feet at night that wakes you up from your sleep:     Blood clot in your veins:    Leg swelling:  x       Pulmonary    Oxygen at home:    Productive cough:     Wheezing:         Neurologic    Sudden weakness in arms or legs:     Sudden numbness in arms or legs:     Sudden onset of difficulty speaking or slurred speech:  Temporary loss of vision in one eye:     Problems with dizziness:         Gastrointestinal    Blood in stool:     Vomited blood:         Genitourinary    Burning when urinating:     Blood in urine:        Psychiatric    Major depression:         Hematologic    Bleeding problems:    Problems with blood clotting too easily:        Skin    Rashes or ulcers: x       Constitutional    Fever or chills:      PHYSICAL EXAM:   Vitals:   12/31/17 1415 12/31/17 1430 12/31/17 1503 12/31/17 1756  BP: (!) 159/86 (!) 174/90 (!) 180/87 (!) 170/105  Pulse:    80  Resp: 13 13 (!) 21 16  Temp:      TempSrc:      SpO2:    97%    GENERAL: The patient is a well-nourished male, in no acute distress. The vital signs are documented above. CARDIAC: There is a regular rate and rhythm.  VASCULAR: Palpable femoral pulses PULMONARY: Non-labored respirations ABDOMEN: Soft and non-tender.  Midline incision  is well-healed without hernia MUSCULOSKELETAL: There are no major deformities or cyanosis. NEUROLOGIC: No focal weakness or paresthesias are detected. SKIN: Necrotic left second toe with opening in the crease between the first and second toe.  There is drainage but no obvious pus. PSYCHIATRIC: The patient has a normal affect.  STUDIES:         +-------+-----------+-----------+------------+------------+ ABI/TBIToday's ABIToday's TBIPrevious ABIPrevious TBI +-------+-----------+-----------+------------+------------+ Right 1.12    0.7    0.31           +-------+-----------+-----------+------------+------------+ Left  0.79          0             +-------+-----------+-----------+------------+------------+  MEDICAL ISSUES:   Gangrene, left great toe: I discussed with the patient that I believe he has failed attempts at trying to salvage the toe and that he needs to proceed with amputation.  This is not an emergent situation as he is not toxic.  He would like to wait until after the Easter holiday.  I told him that I am not available next week but would get 1 of my partners to do his amputation.  I told him that this may also require removal of the left second toe.  His ABI on the left is 0.79.  He likely has disease within the superficial femoral artery which is why his ABIs is not normal.  I suspect however that he has adequate blood flow to heal a toe amputation site.  I will have my office contact him to schedule his amputation next week.  He is in agreement with this.  I will place him on antibiotics in the interim.  He was also given pain medication as well as Cialis at his request.    Durene Cal, MD [facility] [phone] Pager [phone]

## 2018-01-01 ENCOUNTER — Other Ambulatory Visit: Payer: Self-pay | Admitting: *Deleted

## 2018-01-01 NOTE — Progress Notes (Signed)
Call to patient and [facility]:30 am on 01/07/18 for procedure. NPO past MN night prior and must have driver for home. Expect a call and follow the detailed instructions received from the hospital pre-admission department about this surgery. Verbalized understanding.

## 2018-01-05 ENCOUNTER — Encounter (HOSPITAL_COMMUNITY): Payer: Self-pay | Admitting: *Deleted

## 2018-01-05 ENCOUNTER — Other Ambulatory Visit: Payer: Self-pay

## 2018-01-06 MED ORDER — VANCOMYCIN HCL IN DEXTROSE 1-5 GM/200ML-% IV SOLN
1000.0000 mg | INTRAVENOUS | Status: DC
  Filled 2018-01-06: qty 200

## 2018-01-06 MED ORDER — CEFAZOLIN SODIUM-DEXTROSE 2-4 GM/100ML-% IV SOLN
2.0000 g | INTRAVENOUS | Status: AC
  Administered 2018-01-07: 2 g via INTRAVENOUS
  Filled 2018-01-06: qty 100

## 2018-01-06 NOTE — Anesthesia Preprocedure Evaluation (Addendum)
Anesthesia Evaluation  Patient identified by MRN, date of birth, ID band Patient awake    Reviewed: Allergy & Precautions, H&P , NPO status , Patient's Chart, lab work & pertinent test results, reviewed documented beta blocker date and time   Airway Mallampati: II  TM Distance: >3 FB Neck ROM: Full    Dental  (+) Edentulous Upper, Edentulous Lower   Pulmonary Current Smoker,    breath sounds clear to auscultation       Cardiovascular Exercise Tolerance: Good hypertension, Pt. on medications and Pt. on home beta blockers + Peripheral Vascular Disease   Rhythm:Regular Rate:Normal  '19 Cath - No CAD  '19 TTE - Moderate concentric LVH. EF 65% to 70%. Mild hypokinesis of the midinferior myocardium. Grade II   diastolic dysfunction. Mildly dilated LA. PASP 30mHg.   Neuro/Psych negative neurological ROS  negative psych ROS   GI/Hepatic negative GI ROS, (+)     substance abuse  marijuana use,   Endo/Other  negative endocrine ROS  Renal/GU negative Renal ROS  negative genitourinary   Musculoskeletal   Abdominal   Peds  Hematology negative hematology ROS (+)   Anesthesia Other Findings   Reproductive/Obstetrics negative OB ROS                             Anesthesia Physical  Anesthesia Plan  ASA: III  Anesthesia Plan: MAC   Post-op Pain Management:  Regional for Post-op pain   Induction: Intravenous  PONV Risk Score and Plan: Propofol infusion and Treatment may vary due to age or medical condition  Airway Management Planned: Natural Airway and Simple Face Mask  Additional Equipment:   Intra-op Plan:   Post-operative Plan:   Informed Consent: I have reviewed the patients History and Physical, chart, labs and discussed the procedure including the risks, benefits and alternatives for the proposed anesthesia with the patient or authorized representative who has indicated his/her  understanding and acceptance.   Dental advisory given  Plan Discussed with: CRNA and Anesthesiologist  Anesthesia Plan Comments:         Anesthesia Quick Evaluation

## 2018-01-07 ENCOUNTER — Telehealth (INDEPENDENT_AMBULATORY_CARE_PROVIDER_SITE_OTHER): Payer: Self-pay | Admitting: Physician Assistant

## 2018-01-07 ENCOUNTER — Encounter (HOSPITAL_COMMUNITY): Payer: Self-pay | Admitting: Urology

## 2018-01-07 ENCOUNTER — Ambulatory Visit (HOSPITAL_COMMUNITY): Payer: Medicare Other | Admitting: Anesthesiology

## 2018-01-07 ENCOUNTER — Ambulatory Visit (HOSPITAL_COMMUNITY)
Admission: RE | Admit: 2018-01-07 | Discharge: 2018-01-07 | Disposition: A | Discharge status: Home / Self Care | Payer: Medicare Other | Source: Ambulatory Visit | Attending: Vascular Surgery | Admitting: Vascular Surgery

## 2018-01-07 ENCOUNTER — Encounter (HOSPITAL_COMMUNITY)
Admission: RE | Disposition: A | Discharge status: Home / Self Care | Payer: Self-pay | Source: Ambulatory Visit | Attending: Vascular Surgery

## 2018-01-07 DIAGNOSIS — I10 Essential (primary) hypertension: Secondary | ICD-10-CM | POA: Insufficient documentation

## 2018-01-07 DIAGNOSIS — I70262 Atherosclerosis of native arteries of extremities with gangrene, left leg: Secondary | ICD-10-CM | POA: Diagnosis not present

## 2018-01-07 DIAGNOSIS — G8918 Other acute postprocedural pain: Secondary | ICD-10-CM | POA: Diagnosis not present

## 2018-01-07 DIAGNOSIS — Z79899 Other long term (current) drug therapy: Secondary | ICD-10-CM | POA: Insufficient documentation

## 2018-01-07 DIAGNOSIS — I96 Gangrene, not elsewhere classified: Secondary | ICD-10-CM | POA: Diagnosis not present

## 2018-01-07 DIAGNOSIS — F17200 Nicotine dependence, unspecified, uncomplicated: Secondary | ICD-10-CM | POA: Insufficient documentation

## 2018-01-07 HISTORY — PX: AMPUTATION: SHX166

## 2018-01-07 LAB — SURGICAL PCR SCREEN
MRSA, PCR: POSITIVE — AB
Staphylococcus aureus: POSITIVE — AB

## 2018-01-07 SURGERY — AMPUTATION DIGIT
Anesthesia: Monitor Anesthesia Care | Laterality: Left

## 2018-01-07 MED ORDER — CHLORHEXIDINE GLUCONATE 4 % EX LIQD: 60.0000 mL | Freq: Once | CUTANEOUS | Status: DC

## 2018-01-07 MED ORDER — MIDAZOLAM HCL 5 MG/5ML IJ SOLN
INTRAMUSCULAR | Status: DC | PRN
  Administered 2018-01-07: 2 mg via INTRAVENOUS

## 2018-01-07 MED ORDER — LACTATED RINGERS IV SOLN
INTRAVENOUS | Status: DC | PRN
  Administered 2018-01-07: 07:00:00 via INTRAVENOUS

## 2018-01-07 MED ORDER — OXYCODONE HCL 5 MG PO TABS: 5.0000 mg | ORAL_TABLET | Freq: Once | ORAL | Status: DC | PRN

## 2018-01-07 MED ORDER — PROPOFOL 10 MG/ML IV BOLUS
INTRAVENOUS | Status: AC
  Filled 2018-01-07: qty 20

## 2018-01-07 MED ORDER — FENTANYL CITRATE (PF) 250 MCG/5ML IJ SOLN
INTRAMUSCULAR | Status: AC
  Filled 2018-01-07: qty 5

## 2018-01-07 MED ORDER — 0.9 % SODIUM CHLORIDE (POUR BTL) OPTIME
TOPICAL | Status: DC | PRN
  Administered 2018-01-07: 1000 mL

## 2018-01-07 MED ORDER — FENTANYL CITRATE (PF) 100 MCG/2ML IJ SOLN: 25.0000 ug | INTRAMUSCULAR | Status: DC | PRN

## 2018-01-07 MED ORDER — ONDANSETRON HCL 4 MG/2ML IJ SOLN
INTRAMUSCULAR | Status: DC | PRN
  Administered 2018-01-07: 4 mg via INTRAVENOUS

## 2018-01-07 MED ORDER — MIDAZOLAM HCL 2 MG/2ML IJ SOLN
INTRAMUSCULAR | Status: AC
  Filled 2018-01-07: qty 2

## 2018-01-07 MED ORDER — OXYCODONE HCL 5 MG/5ML PO SOLN: 5.0000 mg | Freq: Once | ORAL | Status: DC | PRN

## 2018-01-07 MED ORDER — BUPIVACAINE-EPINEPHRINE (PF) 0.5% -1:200000 IJ SOLN
INTRAMUSCULAR | Status: DC | PRN
  Administered 2018-01-07: 30 mL via PERINEURAL

## 2018-01-07 MED ORDER — LIDOCAINE 2% (20 MG/ML) 5 ML SYRINGE
INTRAMUSCULAR | Status: AC
  Filled 2018-01-07: qty 5

## 2018-01-07 MED ORDER — PROMETHAZINE HCL 25 MG/ML IJ SOLN: 6.2500 mg | INTRAMUSCULAR | Status: DC | PRN

## 2018-01-07 MED ORDER — ONDANSETRON HCL 4 MG/2ML IJ SOLN
INTRAMUSCULAR | Status: AC
  Filled 2018-01-07: qty 2

## 2018-01-07 MED ORDER — LIDOCAINE 2% (20 MG/ML) 5 ML SYRINGE
INTRAMUSCULAR | Status: DC | PRN
  Administered 2018-01-07: 20 mg via INTRAVENOUS

## 2018-01-07 MED ORDER — SODIUM CHLORIDE 0.9 % IV SOLN: INTRAVENOUS | Status: DC

## 2018-01-07 MED ORDER — FENTANYL CITRATE (PF) 250 MCG/5ML IJ SOLN
INTRAMUSCULAR | Status: DC | PRN
  Administered 2018-01-07: 50 ug via INTRAVENOUS

## 2018-01-07 MED ORDER — PROPOFOL 500 MG/50ML IV EMUL
INTRAVENOUS | Status: DC | PRN
  Administered 2018-01-07: 100 ug/kg/min via INTRAVENOUS

## 2018-01-07 SURGICAL SUPPLY — 33 items
BNDG COHESIVE 4X5 TAN STRL (GAUZE/BANDAGES/DRESSINGS) ×3 IMPLANT
BNDG GAUZE ELAST 4 BULKY (GAUZE/BANDAGES/DRESSINGS) ×3 IMPLANT
CANISTER SUCT 3000ML PPV (MISCELLANEOUS) ×3 IMPLANT
CLIP LIGATING EXTRA MED SLVR (CLIP) IMPLANT
CLIP LIGATING EXTRA SM BLUE (MISCELLANEOUS) IMPLANT
COVER SURGICAL LIGHT HANDLE (MISCELLANEOUS) ×3 IMPLANT
DRAPE EXTREMITY T 121X128X90 (DRAPE) ×3 IMPLANT
DRSG EMULSION OIL 3X3 NADH (GAUZE/BANDAGES/DRESSINGS) ×3 IMPLANT
ELECT REM PT RETURN 9FT ADLT (ELECTROSURGICAL) ×3
ELECTRODE REM PT RTRN 9FT ADLT (ELECTROSURGICAL) ×1 IMPLANT
GAUZE SPONGE 4X4 12PLY STRL (GAUZE/BANDAGES/DRESSINGS) IMPLANT
GAUZE SPONGE 4X4 12PLY STRL LF (GAUZE/BANDAGES/DRESSINGS) ×3 IMPLANT
GLOVE BIOGEL PI IND STRL 6.5 (GLOVE) ×3 IMPLANT
GLOVE BIOGEL PI INDICATOR 6.5 (GLOVE) ×6
GLOVE ECLIPSE 6.5 STRL STRAW (GLOVE) ×6 IMPLANT
GLOVE SS BIOGEL STRL SZ 7.5 (GLOVE) ×1 IMPLANT
GLOVE SUPERSENSE BIOGEL SZ 7.5 (GLOVE) ×2
GOWN STRL REUS W/ TWL LRG LVL3 (GOWN DISPOSABLE) ×3 IMPLANT
GOWN STRL REUS W/TWL LRG LVL3 (GOWN DISPOSABLE) ×6
KIT BASIN OR (CUSTOM PROCEDURE TRAY) ×3 IMPLANT
KIT TURNOVER KIT B (KITS) ×3 IMPLANT
NEEDLE 22X1 1/2 (OR ONLY) (NEEDLE) IMPLANT
NS IRRIG 1000ML POUR BTL (IV SOLUTION) ×3 IMPLANT
PACK GENERAL/GYN (CUSTOM PROCEDURE TRAY) ×3 IMPLANT
PAD ARMBOARD 7.5X6 YLW CONV (MISCELLANEOUS) ×3 IMPLANT
SUT ETHILON 2 0 PSLX (SUTURE) ×6 IMPLANT
SUT ETHILON 3 0 PS 1 (SUTURE) ×6 IMPLANT
SUT VIC AB 3-0 SH 27 (SUTURE)
SUT VIC AB 3-0 SH 27X BRD (SUTURE) IMPLANT
SYR CONTROL 10ML LL (SYRINGE) IMPLANT
TOWEL GREEN STERILE (TOWEL DISPOSABLE) ×3 IMPLANT
UNDERPAD 30X30 (UNDERPADS AND DIAPERS) ×3 IMPLANT
WATER STERILE IRR 1000ML POUR (IV SOLUTION) ×3 IMPLANT

## 2018-01-07 NOTE — Anesthesia Postprocedure Evaluation (Signed)
Anesthesia Post Note  Patient: Austin [name]  Procedure(s) Performed: AMPUTATION  GREAT TOE LEFT FOOT (Left )     Patient location during evaluation: PACU Anesthesia Type: MAC Level of consciousness: awake and alert Pain management: pain level controlled Vital Signs Assessment: post-procedure vital signs reviewed and stable Respiratory status: spontaneous breathing, nonlabored ventilation and respiratory function stable Cardiovascular status: stable and blood pressure returned to baseline Anesthetic complications: no    Last Vitals:  Vitals:   01/07/18 0847 01/07/18 0903  BP: (!) 118/49 (!) 142/71  Pulse: (!) 55 (!) 59  Resp: (!) 21 14  Temp:    SpO2: 99% 97%    Last Pain:  Vitals:   01/07/18 0903  TempSrc:   PainSc: 0-No pain                 [name]

## 2018-01-07 NOTE — Interval H&P Note (Signed)
History and Physical Interval Note:  01/07/2018 7:10 AM  Austin DurandStephen [name]  has presented today for surgery, with the diagnosis of NON VIABLE TISSUE LEFT GREAT TOE  The various methods of treatment have been discussed with the patient and family. After consideration of risks, benefits and other options for treatment, the patient has consented to  Procedure(s): AMPUTATION  GREAT TOE POSSIBLE SECOND TOE LEFT FOOT (Left) as a surgical intervention .  The patient's history has been reviewed, patient examined, no change in status, stable for surgery.  I have reviewed the patient's chart and labs.  Questions were answered to the patient's satisfaction.     Gretta Beganodd Aarvi Stotts

## 2018-01-07 NOTE — Telephone Encounter (Signed)
Gm you saw patient on 4/17  And you told him to go to the Hospital and his wife Adrianne called  regarding a medication that you were suppose to called in to [facility] at [place]   Gabapentin please, call her at  [phone] Thank you

## 2018-01-07 NOTE — Transfer of Care (Signed)
Immediate Anesthesia Transfer of Care Note  Patient: Austin [name]  Procedure(s) Performed: AMPUTATION  GREAT TOE LEFT FOOT (Left )  Patient Location: PACU  Anesthesia Type:MAC combined with regional for post-op pain  Level of Consciousness: drowsy and patient cooperative  Airway & Oxygen Therapy: Patient Spontanous Breathing and Patient connected to face mask oxygen  Post-op Assessment: Report given to RN and Post -op Vital signs reviewed and stable  Post vital signs: Reviewed and stable  Last Vitals:  Vitals Value Taken Time  BP 139/58 01/07/2018  8:32 AM  Temp 36.4 C 01/07/2018  8:32 AM  Pulse 57 01/07/2018  8:38 AM  Resp 15 01/07/2018  8:38 AM  SpO2 94 % 01/07/2018  8:38 AM  Vitals shown include unvalidated device data.  Last Pain:  Vitals:   01/07/18 0832  TempSrc:   PainSc: 0-No pain      Patients Stated Pain Goal: 5 (28/20/60 1561)  Complications: No apparent anesthesia complications

## 2018-01-07 NOTE — Op Note (Signed)
    OPERATIVE REPORT  DATE OF SURGERY: 01/07/2018  PATIENT: Austin [name], 67 y.o. male MRN: [id number]  DOB: 06/10/1951  PRE-OPERATIVE DIAGNOSIS: Gangrene left great toe  POST-OPERATIVE DIAGNOSIS:  Same  PROCEDURE: Left great toe ray amputation  SURGEON:  Gretta Beganodd Danny Zimny, M.D.  PHYSICIAN ASSISTANT: Nurse  ANESTHESIA: Ankle block  EBL: Normal ml  Total I/O In: 800 [I.V.:800] Out: -   BLOOD ADMINISTERED: None  DRAINS: None  SPECIMEN: None  COUNTS CORRECT:  YES  PLAN OF CARE: PACU  PATIENT DISPOSITION:  PACU - hemodynamically stable  PROCEDURE DETAILS: Patient was taken to the operating placed supine position where the area of the left foot was prepped and draped you sterile fashion.  There was involvement of the gangrene down to the base of the lateral aspect of the great toe but no obvious involvement in the webspace between the first and second toe.  A tennis racquet type incision was made at the base of the great toe and to the metatarsal head.  There was no evidence of involvement in the metatarsal head.  Tendon was debrided back past the amputation site.  The periosteum was elevated off the metatarsal bone and the bone was divided with bone shear.  There was no involvement of the second toe.  There was good bleeding and this was controlled with electrocautery.  Austin [name] was used to debride the bone back further proximally.  The wound was irrigated with saline and hemostasis was obtained with electrocautery.  The wound was closed with 2-0 nylon mattress sutures.  Sterile dressing and Covan wrap were placed.  The patient was transferred to the recovery room in stable condition  Larina Earthlyodd F. Keilani Terrance, M.D., [facility] 01/07/2018 8:50 AM

## 2018-01-07 NOTE — Anesthesia Procedure Notes (Signed)
Anesthesia Regional Block: Popliteal block   Pre-Anesthetic Checklist: ,, timeout performed, Correct Patient, Correct Site, Correct Laterality, Correct Procedure, Correct Position, site marked, Risks and benefits discussed,  Surgical consent,  Pre-op evaluation,  At surgeon's request and post-op pain management  Laterality: Left  Prep: chloraprep       Needles:  Injection technique: Single-shot  Needle Type: Echogenic Needle     Needle Length: 9cm  Needle Gauge: 21     Additional Needles:   Narrative:  Start time: 01/07/2018 7:05 AM End time: 01/07/2018 7:09 AM Injection made incrementally with aspirations every 5 mL.  Performed by: Personally  Anesthesiologist: Beryle LatheBrock, Thomas E, MD  Additional Notes: No pain on injection. No increased resistance to injection. Injection made in 5cc increments. Good needle visualization. Patient tolerated the procedure well.

## 2018-01-07 NOTE — Progress Notes (Signed)
Orthopedic Tech Progress Note Patient Details:  Doran DurandStephen Ehlert 06/06/1951 [id number]  Ortho Devices Type of Ortho Device: Darco shoe Ortho Device/Splint Location: lle Ortho Device/Splint Interventions: Application   Post Interventions Patient Tolerated: Well Instructions Provided: Care of device   Nikki DomCrawford, Lugene Hitt 01/07/2018, 9:04 AM

## 2018-01-08 ENCOUNTER — Telehealth: Payer: Self-pay | Admitting: Surgery

## 2018-01-08 ENCOUNTER — Other Ambulatory Visit: Payer: Self-pay | Admitting: *Deleted

## 2018-01-08 ENCOUNTER — Encounter (HOSPITAL_COMMUNITY): Payer: Self-pay | Admitting: Vascular Surgery

## 2018-01-08 DIAGNOSIS — I96 Gangrene, not elsewhere classified: Secondary | ICD-10-CM

## 2018-01-08 MED ORDER — OXYCODONE-ACETAMINOPHEN 5-325 MG PO TABS: 1.0000 | ORAL_TABLET | Freq: Four times a day (QID) | ORAL | 0 refills | Status: DC | PRN

## 2018-01-08 NOTE — Telephone Encounter (Signed)
Spoke to pt for appt 5/20, mld lttr Transferred to triage due to pt req for pain meds

## 2018-01-08 NOTE — Telephone Encounter (Signed)
-----   Message from Sharee PimpleMarilyn K McChesney, RN sent at 01/07/2018  7:57 PM EDT ----- Regarding: 3-4 weeks with Dr. Myra GianottiBrabham   ----- Message ----- From: Forestine NaEveland, Matthew, PA-C Sent: 01/07/2018   8:46 AM To: [facility] Charge Pool  Can you schedule an appt for this pt with Dr. Myra GianottiBrabham in 3-4 weeks.  PO L 1,2 toe amp. Thanks, Dow ChemicalMatt

## 2018-01-08 NOTE — Telephone Encounter (Signed)
Rx. For pain med approved by Dr. Darrick PennaFields. Family to pick up today.

## 2018-01-09 ENCOUNTER — Other Ambulatory Visit: Payer: Self-pay | Admitting: Nurse Practitioner

## 2018-01-09 MED ORDER — GABAPENTIN 300 MG PO CAPS: 300.0000 mg | ORAL_CAPSULE | Freq: Three times a day (TID) | ORAL | 2 refills | Status: DC

## 2018-01-09 NOTE — Telephone Encounter (Signed)
CMA called patient wife to inform the Gabapentin has been sent [facility].

## 2018-01-09 NOTE — Telephone Encounter (Signed)
Please let patient know gabapentin has been sent to [facility]

## 2018-01-20 ENCOUNTER — Telehealth: Payer: Self-pay | Admitting: *Deleted

## 2018-01-20 ENCOUNTER — Other Ambulatory Visit: Payer: Self-pay | Admitting: *Deleted

## 2018-01-20 MED ORDER — TRAMADOL HCL 50 MG PO TABS: 50.0000 mg | ORAL_TABLET | Freq: Four times a day (QID) | ORAL | 0 refills | Status: DC | PRN

## 2018-01-20 NOTE — Telephone Encounter (Signed)
Patient called c/o "spisodes of sharp shooting pain in left foot s/p great toe amputation. Requesting pain medication. Denies any signs or symptoms of infection and according to patient area is healing well. Completed dose of 20 Percocet that was Rx on 01/08/18. Rx for Tramadol by Dr. Arbie Cookey. Patient instructed to use for severe pain only and other times take OTC pain medication. Instructed to follow up with PCP ZO:[id number] dosage and if anything additional for nerve pain. Verbalized understanding.

## 2018-02-02 ENCOUNTER — Other Ambulatory Visit: Payer: Self-pay

## 2018-02-02 ENCOUNTER — Ambulatory Visit (INDEPENDENT_AMBULATORY_CARE_PROVIDER_SITE_OTHER): Payer: Self-pay | Admitting: Surgery

## 2018-02-02 ENCOUNTER — Encounter: Payer: Self-pay | Admitting: Surgery

## 2018-02-02 VITALS — BP 147/95 | HR 66 | Temp 98.3°F | Resp 16 | Ht 67.0 in | Wt 183.0 lb

## 2018-02-02 DIAGNOSIS — I700 Atherosclerosis of aorta: Secondary | ICD-10-CM

## 2018-02-02 DIAGNOSIS — I7410 Embolism and thrombosis of unspecified parts of aorta: Secondary | ICD-10-CM

## 2018-02-02 MED ORDER — OXYCODONE HCL 5 MG PO TABS: 5.0000 mg | ORAL_TABLET | Freq: Three times a day (TID) | ORAL | 0 refills | Status: DC | PRN

## 2018-02-02 MED ORDER — GABAPENTIN 300 MG PO CAPS: 300.0000 mg | ORAL_CAPSULE | Freq: Two times a day (BID) | ORAL | 3 refills | Status: DC

## 2018-02-02 NOTE — Progress Notes (Signed)
   Patient name: Austin [name] MRN: [id number] DOB: 10-06-1950 Sex: male  REASON FOR VISIT:     post op  HISTORY OF PRESENT ILLNESS:   Austin [name] is a 67 y.o. 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  This was done for a gangrenous left great toe which has been present for approximately 2 months.  We elected to let the toe demarcate and see if it had an option for improving after his bypass surgery.  Unfortunately, his toe did not improve and on 01-07-2018, he underwent left great toe amputation.  There was good bleeding at the time of the operation.  He is here today for follow up     CURRENT MEDICATIONS:    Current Outpatient Medications  Medication Sig Dispense Refill  . amLODipine (NORVASC) 10 MG tablet Take 1 tablet (10 mg total) by mouth daily. 30 tablet 2  . atorvastatin (LIPITOR) 10 MG tablet Take 1 tablet (10 mg total) by mouth daily. 30 tablet 2  . gabapentin (NEURONTIN) 300 MG capsule Take 1 capsule (300 mg total) by mouth 3 (three) times daily. 90 capsule 2  . metoprolol succinate (TOPROL-XL) 25 MG 24 hr tablet Take 1 tablet (25 mg total) by mouth daily. 30 tablet 2  . naproxen sodium (ALEVE) 220 MG tablet Take 220 mg by mouth 2 (two) times daily as needed (pain).    . traMADol (ULTRAM) 50 MG tablet Take 1 tablet (50 mg total) by mouth every 6 (six) hours as needed. 20 tablet 0  . aspirin EC 81 MG tablet Take 1 tablet (81 mg total) by mouth daily. (Patient not taking: Reported on 02/02/2018)    . oxyCODONE-acetaminophen (PERCOCET) 5-325 MG tablet Take 1 tablet by mouth every 6 (six) hours as needed. (Patient not taking: Reported on 02/02/2018) 20 tablet 0   No current facility-administered medications for this visit.     REVIEW OF SYSTEMS:    denotes positive finding,  denotes negative finding Cardiac  Comments:  Chest pain or chest pressure:    Shortness of breath upon exertion:    Short of breath when lying flat:      Irregular heart rhythm:    Constitutional    Fever or chills:      PHYSICAL EXAM:   There were no vitals filed for this visit.  GENERAL: The patient is a well-nourished male, in no acute distress. The vital signs are documented above. CARDIOVASCULAR: There is a regular rate and rhythm. PULMONARY: Non-labored respirations Toe amputation incision is healing nicely.  STUDIES:   None   MEDICAL ISSUES:   Sutures will be removed today.  I gave the patient 15 5 mg oxycodone tablets.  I also increased his Neurontin to 900 mg twice daily.  He is scheduled for follow-up in 3 months.  Durene Cal, MD [facility] [phone] Pager [phone]

## 2018-02-05 ENCOUNTER — Other Ambulatory Visit: Payer: Self-pay | Admitting: Physician Assistant

## 2018-02-05 DIAGNOSIS — I700 Atherosclerosis of aorta: Secondary | ICD-10-CM

## 2018-02-05 DIAGNOSIS — I7410 Embolism and thrombosis of unspecified parts of aorta: Principal | ICD-10-CM

## 2018-02-05 MED ORDER — GABAPENTIN 300 MG PO CAPS
900.0000 mg | ORAL_CAPSULE | Freq: Two times a day (BID) | ORAL | 3 refills | Status: DC
Start: 2018-02-05 — End: 2018-06-22

## 2018-02-23 ENCOUNTER — Other Ambulatory Visit: Payer: Self-pay

## 2018-02-23 DIAGNOSIS — I96 Gangrene, not elsewhere classified: Secondary | ICD-10-CM

## 2018-03-02 ENCOUNTER — Ambulatory Visit (INDEPENDENT_AMBULATORY_CARE_PROVIDER_SITE_OTHER): Payer: Medicare Other | Admitting: Physician Assistant

## 2018-03-02 ENCOUNTER — Encounter (INDEPENDENT_AMBULATORY_CARE_PROVIDER_SITE_OTHER): Payer: Self-pay | Admitting: Physician Assistant

## 2018-03-02 ENCOUNTER — Other Ambulatory Visit: Payer: Self-pay

## 2018-03-02 VITALS — BP 160/90 | HR 72 | Temp 98.0°F | Ht 67.0 in | Wt 184.0 lb

## 2018-03-02 DIAGNOSIS — N529 Male erectile dysfunction, unspecified: Secondary | ICD-10-CM | POA: Diagnosis not present

## 2018-03-02 DIAGNOSIS — Z1159 Encounter for screening for other viral diseases: Secondary | ICD-10-CM | POA: Diagnosis not present

## 2018-03-02 DIAGNOSIS — I10 Essential (primary) hypertension: Secondary | ICD-10-CM

## 2018-03-02 DIAGNOSIS — R3915 Urgency of urination: Secondary | ICD-10-CM

## 2018-03-02 DIAGNOSIS — B351 Tinea unguium: Secondary | ICD-10-CM | POA: Diagnosis not present

## 2018-03-02 DIAGNOSIS — G546 Phantom limb syndrome with pain: Secondary | ICD-10-CM

## 2018-03-02 DIAGNOSIS — Z1211 Encounter for screening for malignant neoplasm of colon: Secondary | ICD-10-CM

## 2018-03-02 MED ORDER — TADALAFIL 20 MG PO TABS: 20.0000 mg | ORAL_TABLET | Freq: Every day | ORAL | 2 refills | Status: DC | PRN

## 2018-03-02 MED ORDER — TERBINAFINE HCL 250 MG PO TABS: 250.0000 mg | ORAL_TABLET | Freq: Every day | ORAL | 0 refills | Status: DC

## 2018-03-02 NOTE — Patient Instructions (Signed)

## 2018-03-02 NOTE — Progress Notes (Signed)
Subjective:  Patient ID: Doran DurandStephen Rinella, male    DOB: 08/08/1951  Age: 67 y.o. MRN: [id number]  CC: establish care  HPI Doran DurandStephen Watts is a 67 y.o. male with a medical history of HTN, aortic occulsion, amputation of left hallux, epididymitis, and back pain presents as a new patient to establish care. BP is found to be elevated today. Review of BP records show many elevated pressures but patient says he only had blood pressure elevations due to "some kind of pain or issue". " I never had blood pressure issues before".  Has phantom limb pain occasionally but not currently. Took oxycodone with relief of pain.      Requests refill of cialis 20 mg for erectile dysfunction. Does not seem pt has had a work up. Says there is associated urinary urgency. Has not had prostate examined and declines DRE. Otherwise, does not endorse CP, palpitations, SOB, HA, abdominal pain, f/c/n/v, rash, or GU sxs.       Outpatient Medications Prior to Visit  Medication Sig Dispense Refill  . amLODipine (NORVASC) 10 MG tablet Take 1 tablet (10 mg total) by mouth daily. 30 tablet 2  . atorvastatin (LIPITOR) 10 MG tablet Take 1 tablet (10 mg total) by mouth daily. 30 tablet 2  . gabapentin (NEURONTIN) 300 MG capsule Take 3 capsules (900 mg total) by mouth 2 (two) times daily. Take 3 tablets twice daily 180 capsule 3  . metoprolol succinate (TOPROL-XL) 25 MG 24 hr tablet Take 1 tablet (25 mg total) by mouth daily. 30 tablet 2  . aspirin EC 81 MG tablet Take 1 tablet (81 mg total) by mouth daily. (Patient not taking: Reported on 02/02/2018)    . naproxen sodium (ALEVE) 220 MG tablet Take 220 mg by mouth 2 (two) times daily as needed (pain).    Marland Kitchen. oxyCODONE (OXY IR/ROXICODONE) 5 MG immediate release tablet Take 1 tablet (5 mg total) by mouth every 8 (eight) hours as needed for severe pain. (Patient not taking: Reported on 03/02/2018) 20 tablet 0  . oxyCODONE-acetaminophen (PERCOCET) 5-325 MG tablet Take 1 tablet by mouth every 6  (six) hours as needed. (Patient not taking: Reported on 02/02/2018) 20 tablet 0  . traMADol (ULTRAM) 50 MG tablet Take 1 tablet (50 mg total) by mouth every 6 (six) hours as needed. (Patient not taking: Reported on 03/02/2018) 20 tablet 0   No facility-administered medications prior to visit.      ROS Review of Systems  Constitutional: Negative for chills, fever and malaise/fatigue.  Eyes: Negative for blurred vision.  Respiratory: Negative for shortness of breath.   Cardiovascular: Negative for chest pain and palpitations.  Gastrointestinal: Negative for abdominal pain and nausea.  Genitourinary: Negative for dysuria and hematuria.  Musculoskeletal: Negative for joint pain and myalgias.  Skin: Negative for rash.  Neurological: Negative for tingling and headaches.  Psychiatric/Behavioral: Negative for depression. The patient is not nervous/anxious.     Objective:  BP (!) 160/90 (BP Location: Right Arm, Patient Position: Sitting, Cuff Size: Normal)   Pulse 72   Temp 98 F (36.7 C) (Oral)   Ht 5\' 7"  (1.702 m)   Wt 184 lb (83.5 kg)   SpO2 97%   BMI 28.82 kg/m   BP/Weight 03/02/2018 02/02/2018 01/07/2018  Systolic BP 160 147 131  Diastolic BP 90 95 62  Wt. (Lbs) 184 183 180  BMI 28.82 28.66 28.19      Physical Exam  Constitutional: He is oriented to person, place, and time.  Well  developed, well nourished, NAD, polite  HENT:  Head: Normocephalic and atraumatic.  Eyes: No scleral icterus.  Neck: Normal range of motion. Neck supple. No thyromegaly present.  Cardiovascular: Normal rate, regular rhythm, normal heart sounds and intact distal pulses. Exam reveals no gallop and no friction rub.  No murmur heard. No LE edema bilaterally  Pulmonary/Chest: Effort normal and breath sounds normal. No respiratory distress. He has no rales.  Musculoskeletal: He exhibits no edema.  Left hallux amputation.   Neurological: He is alert and oriented to person, place, and time.  Skin: Skin  is warm and dry. No rash noted. No erythema. No pallor.  Dark and dystrophic toe nails.  Psychiatric: He has a normal mood and affect. His behavior is normal. Thought content normal.  Vitals reviewed.    Assessment & Plan:   1. Onychomycosis - Comprehensive metabolic panel - Begin terbinafine (LAMISIL) 250 MG tablet; Take 1 tablet (250 mg total) by mouth daily.  Dispense: 90 tablet; Refill: 0. Counseled to avoid alcohol consumption, says he does not drink alcohol anyhow.   2. Erectile dysfunction, unspecified erectile dysfunction type - PSA. Patient declined DRE. - Begin tadalafil (CIALIS) 20 MG tablet; Take 1 tablet (20 mg total) by mouth daily as needed for erectile dysfunction.  Dispense: 10 tablet; Refill: 2  3. Urinary urgency - PSA  4. Hypertension, unspecified type - TSH - Pt somewhat resistant to taking anti-hypertensives. I have told patient we will monitor over time and prescribe if BP remains elevated. Patient agrees with this plan.   5. Need for hepatitis C screening test - Hepatitis c antibody (reflex)  6. Special screening for malignant neoplasms, colon - Fecal occult blood, imunochemical  7. Phantom limb pain (HCC) - Ambulatory referral to Pain Clinic   Meds ordered this encounter  Medications  . terbinafine (LAMISIL) 250 MG tablet    Sig: Take 1 tablet (250 mg total) by mouth daily.    Dispense:  90 tablet    Refill:  0    Order Specific Question:   Supervising Provider    Answer:   Hoy Register [[id number]]  . tadalafil (CIALIS) 20 MG tablet    Sig: Take 1 tablet (20 mg total) by mouth daily as needed for erectile dysfunction.    Dispense:  10 tablet    Refill:  2    Order Specific Question:   Supervising Provider    Answer:   Hoy Register [[id number]]    Follow-up: Return in about 1 month (around 03/30/2018) for HTN.   Loletta Specter PA

## 2018-03-03 ENCOUNTER — Other Ambulatory Visit (INDEPENDENT_AMBULATORY_CARE_PROVIDER_SITE_OTHER): Payer: Self-pay | Admitting: Physician Assistant

## 2018-03-03 ENCOUNTER — Telehealth (INDEPENDENT_AMBULATORY_CARE_PROVIDER_SITE_OTHER): Payer: Self-pay

## 2018-03-03 DIAGNOSIS — R7989 Other specified abnormal findings of blood chemistry: Secondary | ICD-10-CM

## 2018-03-03 LAB — COMPREHENSIVE METABOLIC PANEL
ALT: 18 IU/L (ref 0–44)
AST: 21 IU/L (ref 0–40)
Albumin/Globulin Ratio: 1.8 (ref 1.2–2.2)
Albumin: 4.4 g/dL (ref 3.6–4.8)
Alkaline Phosphatase: 92 IU/L (ref 39–117)
BILIRUBIN TOTAL: 0.3 mg/dL (ref 0.0–1.2)
BUN/Creatinine Ratio: 16 (ref 10–24)
BUN: 17 mg/dL (ref 8–27)
CALCIUM: 9.4 mg/dL (ref 8.6–10.2)
CO2: 21 mmol/L (ref 20–29)
Chloride: 107 mmol/L — ABNORMAL HIGH (ref 96–106)
Creatinine, Ser: 1.09 mg/dL (ref 0.76–1.27)
GFR calc non Af Amer: 71 mL/min/{1.73_m2} (ref >59)
GFR, EST AFRICAN AMERICAN: 82 mL/min/{1.73_m2} (ref >59)
GLUCOSE: 96 mg/dL (ref 65–99)
Globulin, Total: 2.5 g/dL (ref 1.5–4.5)
Potassium: 4.2 mmol/L (ref 3.5–5.2)
Sodium: 143 mmol/L (ref 134–144)
TOTAL PROTEIN: 6.9 g/dL (ref 6.0–8.5)

## 2018-03-03 LAB — HEPATITIS C ANTIBODY (REFLEX)

## 2018-03-03 LAB — PSA: PROSTATE SPECIFIC AG, SERUM: 0.3 ng/mL (ref 0.0–4.0)

## 2018-03-03 LAB — HCV COMMENT:

## 2018-03-03 LAB — TSH: TSH: 0.086 u[IU]/mL — ABNORMAL LOW (ref 0.450–4.500)

## 2018-03-03 NOTE — Telephone Encounter (Signed)
Patient is aware that initial thyroid test is abnormal and will need thyroid panel at next OV. PSA, liver, kidney and glucose normal and Hepatitis C negative. Maryjean Mornempestt S Roberts, CMA

## 2018-03-03 NOTE — Telephone Encounter (Signed)
-----   Message from Loletta Specteroger David Gomez, PA-C sent at 03/03/2018  8:38 AM EDT ----- Initial thyroid testing abnormal. Will now need to do full panel at f/u. PSA normal, HepC negative, liver/kidney normal, and glucose normal.

## 2018-03-16 DIAGNOSIS — G546 Phantom limb syndrome with pain: Secondary | ICD-10-CM | POA: Diagnosis not present

## 2018-03-16 DIAGNOSIS — Z79899 Other long term (current) drug therapy: Secondary | ICD-10-CM | POA: Diagnosis not present

## 2018-03-30 ENCOUNTER — Ambulatory Visit (INDEPENDENT_AMBULATORY_CARE_PROVIDER_SITE_OTHER): Payer: Medicare Other | Admitting: Physician Assistant

## 2018-03-30 ENCOUNTER — Other Ambulatory Visit: Payer: Self-pay

## 2018-03-30 ENCOUNTER — Encounter (INDEPENDENT_AMBULATORY_CARE_PROVIDER_SITE_OTHER): Payer: Self-pay | Admitting: Physician Assistant

## 2018-03-30 VITALS — BP 137/80 | HR 62 | Temp 97.5°F | Ht 67.0 in | Wt 186.4 lb

## 2018-03-30 DIAGNOSIS — Z1211 Encounter for screening for malignant neoplasm of colon: Secondary | ICD-10-CM

## 2018-03-30 DIAGNOSIS — I10 Essential (primary) hypertension: Secondary | ICD-10-CM | POA: Diagnosis not present

## 2018-03-30 DIAGNOSIS — N529 Male erectile dysfunction, unspecified: Secondary | ICD-10-CM

## 2018-03-30 DIAGNOSIS — G546 Phantom limb syndrome with pain: Secondary | ICD-10-CM

## 2018-03-30 DIAGNOSIS — R7989 Other specified abnormal findings of blood chemistry: Secondary | ICD-10-CM

## 2018-03-30 MED ORDER — TADALAFIL 20 MG PO TABS: 20.0000 mg | ORAL_TABLET | Freq: Every day | ORAL | 2 refills | Status: DC | PRN

## 2018-03-30 MED ORDER — ACETAMINOPHEN-CODEINE #3 300-30 MG PO TABS: 1.0000 | ORAL_TABLET | Freq: Three times a day (TID) | ORAL | 0 refills | Status: AC | PRN

## 2018-03-30 NOTE — Progress Notes (Signed)
Subjective:  Patient ID: Austin [name], male    DOB: 1951/03/01  Age: 67 y.o. MRN: [id number]  CC: HTN  HPI Austin [name] is a 66 y.o. male with a medical history of HTN, aortic occulsion, amputation of left hallux, epididymitis, and back pain presents to f/u on HTN. BP has fluctuated over the last three months despite taking Metoprolol 25 mg and Amlodipine 10 mg. Pt attribute the fluctuation to intensity of phantom limb pain felt at the time. Left hallux amputation causing the phantom limb pain. Was referred to pain clinic and prescribed oxycodone for seven days. Oxycodone was effective but it was only for seven days. Says he did not agree with the treatment plan at pain clinic. Missed his follow up appointment. Would like to be referred elsewhere and to a pain clinic that is located close to the area of [facility].    Pt also found to have low TSH on recent lab testing. Here for thyroid panel. Would also like a printed prescription for Cialis since there was a problem with electronic transmission at the previous visit.   Outpatient Medications Prior to Visit  Medication Sig Dispense Refill  . amLODipine (NORVASC) 10 MG tablet Take 1 tablet (10 mg total) by mouth daily. 30 tablet 2  . atorvastatin (LIPITOR) 10 MG tablet Take 1 tablet (10 mg total) by mouth daily. 30 tablet 2  . gabapentin (NEURONTIN) 300 MG capsule Take 3 capsules (900 mg total) by mouth 2 (two) times daily. Take 3 tablets twice daily 180 capsule 3  . metoprolol succinate (TOPROL-XL) 25 MG 24 hr tablet Take 1 tablet (25 mg total) by mouth daily. 30 tablet 2  . terbinafine (LAMISIL) 250 MG tablet Take 1 tablet (250 mg total) by mouth daily. 90 tablet 0  . aspirin EC 81 MG tablet Take 1 tablet (81 mg total) by mouth daily. (Patient not taking: Reported on 02/02/2018)    . naproxen sodium (ALEVE) 220 MG tablet Take 220 mg by mouth 2 (two) times daily as needed (pain).    . tadalafil (CIALIS) 20 MG tablet Take 1 tablet (20 mg  total) by mouth daily as needed for erectile dysfunction. (Patient not taking: Reported on 03/30/2018) 10 tablet 2   No facility-administered medications prior to visit.      ROS Review of Systems  Constitutional: Negative for chills, fever and malaise/fatigue.  Eyes: Negative for blurred vision.  Respiratory: Negative for shortness of breath.   Cardiovascular: Negative for chest pain and palpitations.  Gastrointestinal: Negative for abdominal pain and nausea.  Genitourinary: Negative for dysuria and hematuria.       Erectile dysfunction  Musculoskeletal: Positive for joint pain. Negative for myalgias.  Skin: Negative for rash.  Neurological: Negative for tingling and headaches.  Psychiatric/Behavioral: Negative for depression. The patient is not nervous/anxious.     Objective:  BP 137/80 (BP Location: Right Arm, Patient Position: Sitting, Cuff Size: Normal)   Pulse 62   Temp (!) 97.5 F (36.4 C) (Oral)   Ht 5\' 7"  (1.702 m)   Wt 186 lb 6.4 oz (84.6 kg)   SpO2 97%   BMI 29.19 kg/m   BP/Weight 03/30/2018 03/02/2018 02/02/2018  Systolic BP 137 160 147  Diastolic BP 80 90 95  Wt. (Lbs) 186.4 184 183  BMI 29.19 28.82 28.66      Physical Exam  Constitutional: He is oriented to person, place, and time.  Well developed, well nourished, NAD, polite  HENT:  Head: Normocephalic and  atraumatic.  Eyes: No scleral icterus.  Neck: Normal range of motion. Neck supple. No thyromegaly present.  Cardiovascular: Normal rate, regular rhythm and normal heart sounds.  Pulmonary/Chest: Effort normal and breath sounds normal.  Musculoskeletal: He exhibits no edema.  Left hallux amputation, tenderness to palpation at and around the amputation site.   Neurological: He is alert and oriented to person, place, and time.  Skin: Skin is warm and dry. No rash noted. No erythema. No pallor.  Psychiatric: He has a normal mood and affect. His behavior is normal. Thought content normal.  Vitals  reviewed.    Assessment & Plan:    1. Abnormal TSH - Thyroid Panel With TSH  2. Hypertension, unspecified type - Continue on Amlodipine 10 mg and Metoprolol 25 mg  3. Phantom limb pain (HCC) - Ambulatory referral to Pain Clinic - Ambulatory Referral to Neuro Rehab - Begin acetaminophen-codeine (TYLENOL #3) 300-30 MG tablet; Take 1 tablet by mouth every 8 (eight) hours as needed for up to 7 days for moderate pain.  Dispense: 21 tablet; Refill: 0  4. Erectile dysfunction, unspecified erectile dysfunction type - Refill tadalafil (CIALIS) 20 MG tablet; Take 1 tablet (20 mg total) by mouth daily as needed for erectile dysfunction.  Dispense: 10 tablet; Refill: 2   Meds ordered this encounter  Medications  . tadalafil (CIALIS) 20 MG tablet    Sig: Take 1 tablet (20 mg total) by mouth daily as needed for erectile dysfunction.    Dispense:  10 tablet    Refill:  2    Order Specific Question:   Supervising Provider    Answer:   Hoy RegisterNEWLIN, ENOBONG [[id number]]  . acetaminophen-codeine (TYLENOL #3) 300-30 MG tablet    Sig: Take 1 tablet by mouth every 8 (eight) hours as needed for up to 7 days for moderate pain.    Dispense:  21 tablet    Refill:  0    Order Specific Question:   Supervising Provider    Answer:   Hoy RegisterNEWLIN, ENOBONG [[id number]]    Follow-up: Return in about 3 months (around 06/22/2018) for Phantom limb pain.   Loletta Specteroger David Gomez PA

## 2018-03-30 NOTE — Patient Instructions (Signed)

## 2018-03-31 ENCOUNTER — Telehealth (INDEPENDENT_AMBULATORY_CARE_PROVIDER_SITE_OTHER): Payer: Self-pay

## 2018-03-31 LAB — THYROID PANEL WITH TSH
FREE THYROXINE INDEX: 1.7 (ref 1.2–4.9)
T3 UPTAKE RATIO: 26 % (ref 24–39)
T4, Total: 6.5 ug/dL (ref 4.5–12.0)
TSH: 1.55 u[IU]/mL (ref 0.450–4.500)

## 2018-03-31 NOTE — Telephone Encounter (Signed)
-----   Message from Roger David Gomez, PA-C sent at 03/31/2018 12:20 PM EDT ----- Thyroid panel results show no issue with the thyroid. 

## 2018-03-31 NOTE — Telephone Encounter (Signed)
Left message asking patient to call the office. Tempestt S Roberts, CMA  

## 2018-04-01 ENCOUNTER — Telehealth (INDEPENDENT_AMBULATORY_CARE_PROVIDER_SITE_OTHER): Payer: Self-pay

## 2018-04-01 LAB — FECAL OCCULT BLOOD, IMMUNOCHEMICAL: Fecal Occult Bld: NEGATIVE

## 2018-04-01 NOTE — Telephone Encounter (Signed)
Patient is aware that thyroid results do not show any issue with his thyroid. Maryjean Mornempestt S Adelaine Roppolo, CMA

## 2018-04-01 NOTE — Telephone Encounter (Signed)
-----   Message from Loletta Specteroger David Gomez, PA-C sent at 03/31/2018 12:20 PM EDT ----- Thyroid panel results show no issue with the thyroid.

## 2018-04-03 ENCOUNTER — Telehealth (INDEPENDENT_AMBULATORY_CARE_PROVIDER_SITE_OTHER): Payer: Self-pay

## 2018-04-03 NOTE — Telephone Encounter (Signed)
Patient aware of negative FIT. Austin [name], CMA  

## 2018-04-03 NOTE — Telephone Encounter (Signed)
-----   Message from Loletta Specteroger David Gomez, PA-C sent at 04/01/2018  5:53 PM EDT ----- FIT negative.

## 2018-04-05 ENCOUNTER — Other Ambulatory Visit: Payer: Self-pay | Admitting: Physician Assistant

## 2018-04-08 ENCOUNTER — Other Ambulatory Visit (INDEPENDENT_AMBULATORY_CARE_PROVIDER_SITE_OTHER): Payer: Self-pay | Admitting: Physician Assistant

## 2018-04-08 ENCOUNTER — Telehealth (INDEPENDENT_AMBULATORY_CARE_PROVIDER_SITE_OTHER): Payer: Self-pay | Admitting: Physician Assistant

## 2018-04-08 MED ORDER — METOPROLOL SUCCINATE ER 25 MG PO TB24: 25.0000 mg | ORAL_TABLET | Freq: Every day | ORAL | 1 refills | Status: DC

## 2018-04-08 NOTE — Telephone Encounter (Signed)
Patient called requesting medication refill for metoprolol succinate (TOPROL-XL) 25 MG 24 hr tablet   [facility] but have not heard anything back.  Please Advice [phone]  Thank you Austin [name]

## 2018-04-08 NOTE — Telephone Encounter (Signed)
Resent prescription to [facility]. Will see if PA is sent.

## 2018-04-09 NOTE — Telephone Encounter (Signed)
Patient states he was contacted by [facility] and informed that the metoprolol is ready for pick up. Asked patient that if he has any issues when picking up medication give our office a call. Maryjean Mornempestt S Leaner Morici, CMA

## 2018-05-04 ENCOUNTER — Ambulatory Visit (HOSPITAL_COMMUNITY)
Admission: RE | Admit: 2018-05-04 | Discharge: 2018-05-04 | Disposition: A | Discharge status: Home / Self Care | Payer: Medicare Other | Source: Ambulatory Visit | Attending: Surgery | Admitting: Surgery

## 2018-05-04 ENCOUNTER — Encounter: Payer: Self-pay | Admitting: Surgery

## 2018-05-04 ENCOUNTER — Other Ambulatory Visit: Payer: Self-pay

## 2018-05-04 ENCOUNTER — Ambulatory Visit (INDEPENDENT_AMBULATORY_CARE_PROVIDER_SITE_OTHER): Payer: Medicare Other | Admitting: Surgery

## 2018-05-04 VITALS — BP 150/89 | HR 50 | Temp 97.7°F | Resp 20 | Ht 67.0 in | Wt 197.0 lb

## 2018-05-04 DIAGNOSIS — I7410 Embolism and thrombosis of unspecified parts of aorta: Secondary | ICD-10-CM | POA: Diagnosis not present

## 2018-05-04 DIAGNOSIS — I700 Atherosclerosis of aorta: Secondary | ICD-10-CM

## 2018-05-04 DIAGNOSIS — I96 Gangrene, not elsewhere classified: Secondary | ICD-10-CM | POA: Diagnosis not present

## 2018-05-04 DIAGNOSIS — I10 Essential (primary) hypertension: Secondary | ICD-10-CM | POA: Diagnosis not present

## 2018-05-04 DIAGNOSIS — I739 Peripheral vascular disease, unspecified: Secondary | ICD-10-CM | POA: Insufficient documentation

## 2018-05-04 NOTE — Progress Notes (Signed)
[facility]  Patient name: Austin [name] MRN: [id number] DOB: 02/13/1951 Sex: male   REASON FOR VISIT:    Follow up  HISOTRY OF PRESENT ILLNESS:   Timoteo ExposeStephen [name] a 67 y.o.malewho is s/p aortobifemoral bypasson 12/03/2017. This was done for a gangrenous left great toe which has been present for approximately 2 months. We elected to let the toe demarcate and see if it had an option for improving after his bypass surgery.  Unfortunately, his toe did not improve and on 01-07-2018, he underwent left great toe amputation.  There was good bleeding at the time of the operation.   He is now walking several miles without symptoms.  PAST MEDICAL HISTORY:   Past Medical History:  Diagnosis Date  . Back pain   . Hypertension      FAMILY HISTORY:   Family History  Problem Relation Age of Onset  . Cancer Mother     SOCIAL HISTORY:   Social History   Tobacco Use  . Smoking status: Current Every Day Smoker    Packs/day: 0.25    Years: 50.00    Pack years: 12.50  . Smokeless tobacco: Never Used  . Tobacco comment: A little less than 1/2 pk per day.   Substance Use Topics  . Alcohol use: No     ALLERGIES:   No Known Allergies   CURRENT MEDICATIONS:   Current Outpatient Medications  Medication Sig Dispense Refill  . amLODipine (NORVASC) 10 MG tablet Take 1 tablet (10 mg total) by mouth daily. 30 tablet 2  . aspirin EC 81 MG tablet Take 1 tablet (81 mg total) by mouth daily.    Marland Kitchen. atorvastatin (LIPITOR) 10 MG tablet Take 1 tablet (10 mg total) by mouth daily. 30 tablet 2  . gabapentin (NEURONTIN) 300 MG capsule Take 3 capsules (900 mg total) by mouth 2 (two) times daily. Take 3 tablets twice daily 180 capsule 3  . metoprolol succinate (TOPROL-XL) 25 MG 24 hr tablet Take 1 tablet (25 mg total) by mouth daily. 90 tablet 1  . terbinafine (LAMISIL) 250 MG tablet Take 1 tablet (250 mg total) by mouth daily. 90  tablet 0   No current facility-administered medications for this visit.     REVIEW OF SYSTEMS:   [X]  denotes positive finding, [ ]  denotes negative finding Cardiac  Comments:  Chest pain or chest pressure:    Shortness of breath upon exertion:    Short of breath when lying flat:    Irregular heart rhythm:        Vascular    Pain in calf, thigh, or hip brought on by ambulation:    Pain in feet at night that wakes you up from your sleep:     Blood clot in your veins:    Leg swelling:         Pulmonary    Oxygen at home:    Productive cough:     Wheezing:         Neurologic    Sudden weakness in arms or legs:     Sudden numbness in arms or legs:     Sudden onset of difficulty speaking or slurred speech:    Temporary loss of vision in one eye:     Problems with dizziness:         Gastrointestinal    Blood in stool:     Vomited blood:         Genitourinary    Burning  when urinating:     Blood in urine:        Psychiatric    Major depression:         Hematologic    Bleeding problems:    Problems with blood clotting too easily:        Skin    Rashes or ulcers:        Constitutional    Fever or chills:      PHYSICAL EXAM:   Vitals:   05/04/18 1152  BP: (!) 150/89  Pulse: (!) 50  Resp: 20  Temp: 97.7 F (36.5 C)  TempSrc: Oral  SpO2: 96%  Weight: 197 lb (89.4 kg)  Height: 5\' 7"  (1.702 m)    GENERAL: The patient is a well-nourished male, in no acute distress. The vital signs are documented above. CARDIAC: There is a regular rate and rhythm.  VASCULAR: Palpable right PT/DP.  Nonpalpable left PULMONARY: Non-labored respirations ABDOMEN: Soft and non-tender.  No incisional hernia MUSCULOSKELETAL: Left great toe amputation site has completely healed NEUROLOGIC: No focal weakness or paresthesias are detected. SKIN: There are no ulcers or rashes noted. PSYCHIATRIC: The patient has a normal affect.  STUDIES:   I have ordered and reviewed his vascular  lab studies with the following findings:  Right ABI= 1.20 (1.12), triphasic Left ABI= 0.78 (0.79), biphasic  MEDICAL ISSUES:   Status post aortobifemoral bypass graft and left great toe amputation: The patient is doing very well.  The left ABI is slightly lower than the right likely secondary to superficial femoral artery stenosis.  There are no indications for intervention at this time.  I have him scheduled for follow-up in 6 months.  He will have ABIs and a carotid duplex.    Durene CalWells Brabham, MD [facility] Tel [phone] Pager [phone]

## 2018-06-22 ENCOUNTER — Ambulatory Visit (INDEPENDENT_AMBULATORY_CARE_PROVIDER_SITE_OTHER): Payer: Medicare Other | Admitting: Physician Assistant

## 2018-06-22 ENCOUNTER — Other Ambulatory Visit: Payer: Self-pay

## 2018-06-22 ENCOUNTER — Encounter (INDEPENDENT_AMBULATORY_CARE_PROVIDER_SITE_OTHER): Payer: Self-pay | Admitting: Physician Assistant

## 2018-06-22 VITALS — BP 147/89 | HR 56 | Temp 98.2°F | Ht 67.0 in | Wt 193.4 lb

## 2018-06-22 DIAGNOSIS — I10 Essential (primary) hypertension: Secondary | ICD-10-CM | POA: Diagnosis not present

## 2018-06-22 DIAGNOSIS — N529 Male erectile dysfunction, unspecified: Secondary | ICD-10-CM

## 2018-06-22 DIAGNOSIS — Z131 Encounter for screening for diabetes mellitus: Secondary | ICD-10-CM

## 2018-06-22 DIAGNOSIS — G546 Phantom limb syndrome with pain: Secondary | ICD-10-CM | POA: Diagnosis not present

## 2018-06-22 LAB — POCT GLYCOSYLATED HEMOGLOBIN (HGB A1C): Hemoglobin A1C: 5.6 % (ref 4.0–5.6)

## 2018-06-22 MED ORDER — ASPIRIN EC 81 MG PO TBEC: 81.0000 mg | DELAYED_RELEASE_TABLET | Freq: Every day | ORAL | 3 refills | Status: DC

## 2018-06-22 MED ORDER — METOPROLOL SUCCINATE ER 25 MG PO TB24: 25.0000 mg | ORAL_TABLET | Freq: Every day | ORAL | 1 refills | Status: DC

## 2018-06-22 MED ORDER — GABAPENTIN 800 MG PO TABS: 800.0000 mg | ORAL_TABLET | Freq: Three times a day (TID) | ORAL | 1 refills | Status: DC

## 2018-06-22 MED ORDER — AMLODIPINE BESYLATE 10 MG PO TABS: 10.0000 mg | ORAL_TABLET | Freq: Every day | ORAL | 1 refills | Status: DC

## 2018-06-22 MED ORDER — SILDENAFIL CITRATE 20 MG PO TABS: 40.0000 mg | ORAL_TABLET | Freq: Every day | ORAL | 3 refills | Status: DC

## 2018-06-22 MED ORDER — ATORVASTATIN CALCIUM 10 MG PO TABS: 10.0000 mg | ORAL_TABLET | Freq: Every day | ORAL | 1 refills | Status: DC

## 2018-06-22 NOTE — Progress Notes (Signed)
Subjective:  Patient ID: Austin [name], male    DOB: [birth year]  Age: 67 y.o. MRN: [id number]  CC: phantom limb pain  HPI  Austin [name] a 67 y.o.malewith a medical history of HTN, aortic occulsion, amputation of left hallux, epididymitis, and back pain presents to f/u on phantom limb pain. Says he feels the presence of the left hallux which was amputated 01/07/18. Pt prescribed Gabapentin 900 mg BID, referred to pain clinic, and referred to neuro rehab. States he declined neuro rehab and pain clinic because he felt he did not need them. Gabapentin has decreased the frequency of his pain but pain can still be intense at times. Pain seems to "show up at the most inopportune times", such as at bedtime. Thinks time and activities will help him heal from phantom limb pain.     BP is noted to be elevated today. Attributed to having a "tiff" with his wife who accompanies patient this morning. Stopped taking amlodipine because he ran out of pills. Does not really believe he has problems with blood pressure. Does not endorse any cardiovascular complaints.     Wants a prescription for erectile dysfunction. Says he was unable to fill his previous prescription because the prescription expired. Was offered low cost Cialis by his pharmacist and would like a prescription of Cialis to be printed for him.        Outpatient Medications Prior to Visit  Medication Sig Dispense Refill  . aspirin EC 81 MG tablet Take 1 tablet (81 mg total) by mouth daily.    Marland Kitchen atorvastatin (LIPITOR) 10 MG tablet Take 1 tablet (10 mg total) by mouth daily. 30 tablet 2  . gabapentin (NEURONTIN) 300 MG capsule Take 3 capsules (900 mg total) by mouth 2 (two) times daily. Take 3 tablets twice daily 180 capsule 3  . metoprolol succinate (TOPROL-XL) 25 MG 24 hr tablet Take 1 tablet (25 mg total) by mouth daily. 90 tablet 1  . amLODipine (NORVASC) 10 MG tablet Take 1 tablet (10 mg total) by mouth daily. (Patient not taking: Reported on  06/22/2018) 30 tablet 2  . terbinafine (LAMISIL) 250 MG tablet Take 1 tablet (250 mg total) by mouth daily. 90 tablet 0   No facility-administered medications prior to visit.      ROS Review of Systems  Constitutional: Negative for chills, fever and malaise/fatigue.  Eyes: Negative for blurred vision.  Respiratory: Negative for shortness of breath.   Cardiovascular: Negative for chest pain and palpitations.  Gastrointestinal: Negative for abdominal pain and nausea.  Genitourinary: Negative for dysuria and hematuria.       Erectile dysfunction  Musculoskeletal: Negative for joint pain and myalgias.       Amputated limb pain  Skin: Negative for rash.  Neurological: Negative for tingling and headaches.  Psychiatric/Behavioral: Negative for depression. The patient is not nervous/anxious.     Objective:  BP (!) 159/85 (BP Location: Right Arm, Patient Position: Sitting, Cuff Size: Normal)   Pulse (!) 56   Temp 98.2 F (36.8 C) (Oral)   Ht 5\' 7"  (1.702 m)   Wt 193 lb 6.4 oz (87.7 kg)   SpO2 94%   BMI 30.29 kg/m   Vitals:   06/22/18 0836 06/22/18 0903  BP: (!) 159/85 (!) 147/89  Pulse: (!) 56   Temp: 98.2 F (36.8 C)   TempSrc: Oral   SpO2: 94%   Weight: 193 lb 6.4 oz (87.7 kg)   Height: 5\' 7"  (1.702 m)  Physical Exam  Constitutional: He is oriented to person, place, and time.  Well developed, well nourished, NAD, polite  HENT:  Head: Normocephalic and atraumatic.  Eyes: No scleral icterus.  Neck: Normal range of motion. Neck supple. No thyromegaly present.  Cardiovascular: Normal rate, regular rhythm and normal heart sounds.  No LE edema bilaterally  Pulmonary/Chest: Effort normal and breath sounds normal.  Musculoskeletal: He exhibits no edema.  Neurological: He is alert and oriented to person, place, and time.  Skin: Skin is warm and dry. No rash noted. No erythema. No pallor.  Psychiatric: He has a normal mood and affect. His behavior is normal. Thought  content normal.  Vitals reviewed.    Assessment & Plan:   1. Phantom limb pain (HCC) - Increase gabapentin (NEURONTIN) 800 MG tablet; Take 1 tablet (800 mg total) by mouth 3 (three) times daily.  Dispense: 270 tablet; Refill: 1  2. Hypertension, unspecified type - Refill amLODipine (NORVASC) 10 MG tablet; Take 1 tablet (10 mg total) by mouth daily.  Dispense: 90 tablet; Refill: 1 - Refill metoprolol succinate (TOPROL-XL) 25 MG 24 hr tablet; Take 1 tablet (25 mg total) by mouth daily.  Dispense: 90 tablet; Refill: 1 - Refill atorvastatin (LIPITOR) 10 MG tablet; Take 1 tablet (10 mg total) by mouth daily.  Dispense: 90 tablet; Refill: 1 - Refill aspirin EC 81 MG tablet; Take 1 tablet (81 mg total) by mouth daily.  Dispense: 90 tablet; Refill: 3 - Comprehensive metabolic panel - Lipid panel  3. Erectile dysfunction, unspecified erectile dysfunction type - Pt will have pharmacy call and provide [place] information of the PDE5i that are covered. I will send out a one month prescription and evaluate patient in one month with consideration for referral to urology.   4. Screening for diabetes mellitus - HgB A1c 5.6%   Meds ordered this encounter  Medications  . amLODipine (NORVASC) 10 MG tablet    Sig: Take 1 tablet (10 mg total) by mouth daily.    Dispense:  90 tablet    Refill:  1    Refills per PCP    Order Specific Question:   Supervising Provider    Answer:   Hoy Register [[id number]]  . metoprolol succinate (TOPROL-XL) 25 MG 24 hr tablet    Sig: Take 1 tablet (25 mg total) by mouth daily.    Dispense:  90 tablet    Refill:  1    Please consider 90 day supplies to promote better adherence    Order Specific Question:   Supervising Provider    Answer:   Hoy Register [[id number]]  . atorvastatin (LIPITOR) 10 MG tablet    Sig: Take 1 tablet (10 mg total) by mouth daily.    Dispense:  90 tablet    Refill:  1    Refills per PCP    Order Specific Question:   Supervising Provider    Answer:    Hoy Register [[id number]]  . gabapentin (NEURONTIN) 800 MG tablet    Sig: Take 1 tablet (800 mg total) by mouth 3 (three) times daily.    Dispense:  270 tablet    Refill:  1    Order Specific Question:   Supervising Provider    Answer:   Hoy Register [[id number]]  . sildenafil (REVATIO) 20 MG tablet    Sig: Take 2 tablets (40 mg total) by mouth daily.    Dispense:  30 tablet    Refill:  3    Order Specific Question:  Supervising Provider    Answer:   Hoy Register [[id number]]  . aspirin EC 81 MG tablet    Sig: Take 1 tablet (81 mg total) by mouth daily.    Dispense:  90 tablet    Refill:  3    Order Specific Question:   Supervising Provider    Answer:   Hoy Register [[id number]]    Follow-up: Return in about 4 weeks (around 07/20/2018) for HTN.   Loletta Specter PA

## 2018-06-22 NOTE — Patient Instructions (Signed)
Managing Your Hypertension Hypertension is commonly called high blood pressure. This is when the force of your blood pressing against the walls of your arteries is too strong. Arteries are blood vessels that carry blood from your heart throughout your body. Hypertension forces the heart to work harder to pump blood, and may cause the arteries to become narrow or stiff. Having untreated or uncontrolled hypertension can cause heart attack, stroke, kidney disease, and other problems. What are blood pressure readings? A blood pressure reading consists of a higher number over a lower number. Ideally, your blood pressure should be below 120/80. The first ("top") number is called the systolic pressure. It is a measure of the pressure in your arteries as your heart beats. The second ("bottom") number is called the diastolic pressure. It is a measure of the pressure in your arteries as the heart relaxes. What does my blood pressure reading mean? Blood pressure is classified into four stages. Based on your blood pressure reading, your health care provider may use the following stages to determine what type of treatment you need, if any. Systolic pressure and diastolic pressure are measured in a unit called mm Hg. Normal  Systolic pressure: below 120.  Diastolic pressure: below 80. Elevated  Systolic pressure: 120-129.  Diastolic pressure: below 80. Hypertension stage 1  Systolic pressure: 130-139.  Diastolic pressure: 80-89. Hypertension stage 2  Systolic pressure: 140 or above.  Diastolic pressure: 90 or above. What health risks are associated with hypertension? Managing your hypertension is an important responsibility. Uncontrolled hypertension can lead to:  A heart attack.  A stroke.  A weakened blood vessel (aneurysm).  Heart failure.  Kidney damage.  Eye damage.  Metabolic syndrome.  Memory and concentration problems.  What changes can I make to manage my  hypertension? Hypertension can be managed by making lifestyle changes and possibly by taking medicines. Your health care provider will help you make a plan to bring your blood pressure within a normal range. Eating and drinking  Eat a diet that is high in fiber and potassium, and low in salt (sodium), added sugar, and fat. An example eating plan is called the DASH (Dietary Approaches to Stop Hypertension) diet. To eat this way: ? Eat plenty of fresh fruits and vegetables. Try to fill half of your plate at each meal with fruits and vegetables. ? Eat whole grains, such as whole wheat pasta, brown rice, or whole grain bread. Fill about one quarter of your plate with whole grains. ? Eat low-fat diary products. ? Avoid fatty cuts of meat, processed or cured meats, and poultry with skin. Fill about one quarter of your plate with lean proteins such as fish, chicken without skin, beans, eggs, and tofu. ? Avoid premade and processed foods. These tend to be higher in sodium, added sugar, and fat.  Reduce your daily sodium intake. Most people with hypertension should eat less than 1,500 mg of sodium a day.  Limit alcohol intake to no more than 1 drink a day for nonpregnant women and 2 drinks a day for men. One drink equals 12 oz of beer, 5 oz of wine, or 1 oz of hard liquor. Lifestyle  Work with your health care provider to maintain a healthy body weight, or to lose weight. Ask what an ideal weight is for you.  Get at least 30 minutes of exercise that causes your heart to beat faster (aerobic exercise) most days of the week. Activities may include walking, swimming, or biking.  Include exercise   to strengthen your muscles (resistance exercise), such as weight lifting, as part of your weekly exercise routine. Try to do these types of exercises for 30 minutes at least 3 days a week.  Do not use any products that contain nicotine or tobacco, such as cigarettes and e-cigarettes. If you need help quitting, ask  your health care provider.  Control any long-term (chronic) conditions you have, such as high cholesterol or diabetes. Monitoring  Monitor your blood pressure at home as told by your health care provider. Your personal target blood pressure may vary depending on your medical conditions, your age, and other factors.  Have your blood pressure checked regularly, as often as told by your health care provider. Working with your health care provider  Review all the medicines you take with your health care provider because there may be side effects or interactions.  Talk with your health care provider about your diet, exercise habits, and other lifestyle factors that may be contributing to hypertension.  Visit your health care provider regularly. Your health care provider can help you create and adjust your plan for managing hypertension. Will I need medicine to control my blood pressure? Your health care provider may prescribe medicine if lifestyle changes are not enough to get your blood pressure under control, and if:  Your systolic blood pressure is 130 or higher.  Your diastolic blood pressure is 80 or higher.  Take medicines only as told by your health care provider. Follow the directions carefully. Blood pressure medicines must be taken as prescribed. The medicine does not work as well when you skip doses. Skipping doses also puts you at risk for problems. Contact a health care provider if:  You think you are having a reaction to medicines you have taken.  You have repeated (recurrent) headaches.  You feel dizzy.  You have swelling in your ankles.  You have trouble with your vision. Get help right away if:  You develop a severe headache or confusion.  You have unusual weakness or numbness, or you feel faint.  You have severe pain in your chest or abdomen.  You vomit repeatedly.  You have trouble breathing. Summary  Hypertension is when the force of blood pumping through  your arteries is too strong. If this condition is not controlled, it may put you at risk for serious complications.  Your personal target blood pressure may vary depending on your medical conditions, your age, and other factors. For most people, a normal blood pressure is less than 120/80.  Hypertension is managed by lifestyle changes, medicines, or both. Lifestyle changes include weight loss, eating a healthy, low-sodium diet, exercising more, and limiting alcohol. This information is not intended to replace advice given to you by your health care provider. Make sure you discuss any questions you have with your health care provider. Document Released: 05/27/2012 Document Revised: 07/31/2016 Document Reviewed: 07/31/2016 Elsevier Interactive Patient Education  2018 [employer]  

## 2018-06-23 ENCOUNTER — Other Ambulatory Visit (INDEPENDENT_AMBULATORY_CARE_PROVIDER_SITE_OTHER): Payer: Self-pay | Admitting: Physician Assistant

## 2018-06-23 DIAGNOSIS — I10 Essential (primary) hypertension: Secondary | ICD-10-CM

## 2018-06-23 LAB — COMPREHENSIVE METABOLIC PANEL
A/G RATIO: 1.8 (ref 1.2–2.2)
ALBUMIN: 4.2 g/dL (ref 3.6–4.8)
ALK PHOS: 86 IU/L (ref 39–117)
ALT: 15 IU/L (ref 0–44)
AST: 20 IU/L (ref 0–40)
BUN / CREAT RATIO: 14 (ref 10–24)
BUN: 16 mg/dL (ref 8–27)
Bilirubin Total: 0.4 mg/dL (ref 0.0–1.2)
CALCIUM: 9.2 mg/dL (ref 8.6–10.2)
CO2: 22 mmol/L (ref 20–29)
CREATININE: 1.17 mg/dL (ref 0.76–1.27)
Chloride: 107 mmol/L — ABNORMAL HIGH (ref 96–106)
GFR calc Af Amer: 75 mL/min/{1.73_m2} (ref >59)
GFR, EST NON AFRICAN AMERICAN: 65 mL/min/{1.73_m2} (ref >59)
GLOBULIN, TOTAL: 2.3 g/dL (ref 1.5–4.5)
Glucose: 84 mg/dL (ref 65–99)
POTASSIUM: 4.4 mmol/L (ref 3.5–5.2)
SODIUM: 140 mmol/L (ref 134–144)
Total Protein: 6.5 g/dL (ref 6.0–8.5)

## 2018-06-23 LAB — LIPID PANEL
CHOL/HDL RATIO: 4.5 ratio (ref 0.0–5.0)
Cholesterol, Total: 172 mg/dL (ref 100–199)
HDL: 38 mg/dL — ABNORMAL LOW (ref >39)
LDL CALC: 112 mg/dL — AB (ref 0–99)
Triglycerides: 110 mg/dL (ref 0–149)
VLDL Cholesterol Cal: 22 mg/dL (ref 5–40)

## 2018-06-23 MED ORDER — ATORVASTATIN CALCIUM 20 MG PO TABS: 20.0000 mg | ORAL_TABLET | Freq: Every day | ORAL | 1 refills | Status: DC

## 2018-06-24 ENCOUNTER — Telehealth (INDEPENDENT_AMBULATORY_CARE_PROVIDER_SITE_OTHER): Payer: Self-pay

## 2018-06-24 NOTE — Telephone Encounter (Signed)
-----   Message from Loletta Specter, PA-C sent at 06/23/2018  5:43 PM EDT ----- Needs to increase atorvastatin from 10mg  to 20mg  per night. I have sent new 20 mg dose to his pharmacy. For now, he can take two 10 mg pills per night.

## 2018-06-24 NOTE — Telephone Encounter (Signed)
Patient is aware that he needs to increase atorvastatin from 10mg  to 20 mg per night. New dose of 20 mg has been sent to pharmacy. But for now take two of the 10 mg per night until they are finished and then pick up new dose from pharmacy. Maryjean Morn, CMA

## 2018-07-20 ENCOUNTER — Ambulatory Visit (INDEPENDENT_AMBULATORY_CARE_PROVIDER_SITE_OTHER): Payer: Medicare Other | Admitting: Physician Assistant

## 2018-08-03 ENCOUNTER — Other Ambulatory Visit: Payer: Self-pay

## 2018-08-03 ENCOUNTER — Ambulatory Visit (INDEPENDENT_AMBULATORY_CARE_PROVIDER_SITE_OTHER): Payer: Medicare Other | Admitting: Physician Assistant

## 2018-08-03 ENCOUNTER — Encounter (INDEPENDENT_AMBULATORY_CARE_PROVIDER_SITE_OTHER): Payer: Self-pay | Admitting: Physician Assistant

## 2018-08-03 VITALS — BP 144/82 | HR 63 | Temp 97.3°F | Ht 67.0 in | Wt 197.8 lb

## 2018-08-03 DIAGNOSIS — M545 Low back pain, unspecified: Secondary | ICD-10-CM

## 2018-08-03 DIAGNOSIS — I10 Essential (primary) hypertension: Secondary | ICD-10-CM

## 2018-08-03 DIAGNOSIS — G546 Phantom limb syndrome with pain: Secondary | ICD-10-CM

## 2018-08-03 MED ORDER — IBUPROFEN 800 MG PO TABS: 800.0000 mg | ORAL_TABLET | Freq: Three times a day (TID) | ORAL | 0 refills | Status: DC | PRN

## 2018-08-03 MED ORDER — LOSARTAN POTASSIUM 50 MG PO TABS: 50.0000 mg | ORAL_TABLET | Freq: Every day | ORAL | 3 refills | Status: DC

## 2018-08-03 NOTE — Progress Notes (Signed)
Subjective:  Patient ID: Austin [name], male    DOB: April 24, 1951  Age: 67 y.o. MRN: [id number]  CC: f/u HTN  HPI [name] a 67 y.o.malewith a medical history of HTN, aortic occulsion, amputation of left hallux, epididymitis, and back pain s/p surgical repair presents to f/u on HTN. Last seen here nearly six weeks ago. BP 147/89 mmHg. Elevated BP attributed to having a "tiff" with his wife and to running out of Amlodipine. All anti-hypertensives refilled. Pt taking meds as directed. BP 153/89 mmHg initially and 144/82 mmHg on retake today.     Phantom limb pain seems to have resolved with increase of Gabapentin. Still feels toe is present but denies pain.     Would like a prescription for Ibuprofen 800 mg for bilateral lower back pain without sciatica. Says his mattress is very soft which is causing his back pain. He is a side sleeper and does not seem to support his spine properly with pillows. Looking into buying a new bed. Does not endorse any other symptoms or complaints.    Outpatient Medications Prior to Visit  Medication Sig Dispense Refill  . amLODipine (NORVASC) 10 MG tablet Take 1 tablet (10 mg total) by mouth daily. 90 tablet 1  . aspirin EC 81 MG tablet Take 1 tablet (81 mg total) by mouth daily. 90 tablet 3  . atorvastatin (LIPITOR) 20 MG tablet Take 1 tablet (20 mg total) by mouth daily. 90 tablet 1  . gabapentin (NEURONTIN) 800 MG tablet Take 1 tablet (800 mg total) by mouth 3 (three) times daily. 270 tablet 1  . metoprolol succinate (TOPROL-XL) 25 MG 24 hr tablet Take 1 tablet (25 mg total) by mouth daily. 90 tablet 1   No facility-administered medications prior to visit.      ROS Review of Systems  Constitutional: Negative for chills, fever and malaise/fatigue.  Eyes: Negative for blurred vision.  Respiratory: Negative for shortness of breath.   Cardiovascular: Negative for chest pain and palpitations.  Gastrointestinal: Negative for abdominal pain and nausea.   Genitourinary: Negative for dysuria and hematuria.  Musculoskeletal: Positive for back pain. Negative for joint pain and myalgias.  Skin: Negative for rash.  Neurological: Negative for tingling and headaches.  Psychiatric/Behavioral: Negative for depression. The patient is not nervous/anxious.     Objective:  BP (!) 153/89 (BP Location: Right Arm, Patient Position: Sitting, Cuff Size: Large)   Pulse 63   Temp (!) 97.3 F (36.3 C) (Oral)   Ht 5\' 7"  (1.702 m)   Wt 197 lb 12.8 oz (89.7 kg)   SpO2 93%   BMI 30.98 kg/m   BP/Weight 08/03/2018 06/22/2018 05/04/2018  Systolic BP 153 147 150  Diastolic BP 89 89 89  Wt. (Lbs) 197.8 193.4 197  BMI 30.98 30.29 30.85      Physical Exam  Constitutional: He is oriented to person, place, and time.  Well developed, well nourished, NAD, polite  HENT:  Head: Normocephalic and atraumatic.  Eyes: No scleral icterus.  Neck: Normal range of motion. Neck supple. No thyromegaly present.  Cardiovascular: Normal rate, regular rhythm and normal heart sounds.  Pulmonary/Chest: Effort normal and breath sounds normal.  Musculoskeletal: He exhibits no edema.  Neurological: He is alert and oriented to person, place, and time.  Normal gait.  Skin: Skin is warm and dry. No rash noted. No erythema. No pallor.  Psychiatric: He has a normal mood and affect. His behavior is normal. Thought content normal.  Vitals reviewed.  Assessment & Plan:    1. Hypertension, unspecified type - Begin losartan (COZAAR) 50 MG tablet; Take 1 tablet (50 mg total) by mouth daily.  Dispense: 90 tablet; Refill: 3 -Continue Amlodipine and Metoprolol.   2. Acute bilateral low back pain without sciatica - Begin ibuprofen (ADVIL,MOTRIN) 800 MG tablet; Take 1 tablet (800 mg total) by mouth every 8 (eight) hours as needed.  Dispense: 21 tablet; Refill: 0 - Educated patient on proper spine support for a side sleeper. Patient also to look into buying a bed with proper support.    3. Phantom limb pain (HCC) - Seems to have resolved with increase of Gabapentin. Still feels toe is present but denies pain.    Meds ordered this encounter  Medications  . ibuprofen (ADVIL,MOTRIN) 800 MG tablet    Sig: Take 1 tablet (800 mg total) by mouth every 8 (eight) hours as needed.    Dispense:  21 tablet    Refill:  0    Order Specific Question:   Supervising Provider    Answer:   Hoy RegisterNEWLIN, ENOBONG [[id number]]  . losartan (COZAAR) 50 MG tablet    Sig: Take 1 tablet (50 mg total) by mouth daily.    Dispense:  90 tablet    Refill:  3    Order Specific Question:   Supervising Provider    Answer:   Hoy RegisterNEWLIN, ENOBONG [[id number]]    Follow-up: Return in about 8 weeks (around 09/28/2018) for HTN.   Loletta Specteroger David Zenab Gronewold PA

## 2018-08-03 NOTE — Patient Instructions (Signed)
Plan de alimentación DASH  DASH Eating Plan  DASH es la sigla en inglés de "Enfoques Alimentarios para Detener la Hipertensión" (Dietary Approaches to Stop Hypertension). El plan de alimentación DASH ha demostrado bajar la presión arterial elevada (hipertensión). También puede reducir el riesgo de diabetes tipo 2, enfermedad cardíaca y accidente cerebrovascular. Este plan también puede ayudar a adelgazar.  Consejos para seguir este plan  Pautas generales  · Evite ingerir más de 2,300 mg (miligramos) de sal (sodio) por día. Si tiene hipertensión, es posible que necesite reducir la ingesta de sodio a 1,500 mg por día.  · Limite el consumo de alcohol a no más de 1 medida por día si es mujer y no está embarazada, y 2 medidas por día si es hombre. Una medida equivale a 12 oz (355 ml) de cerveza, 5 oz (148 ml) de vino o 1½ oz (44 ml) de bebidas alcohólicas de alta graduación.  · Trabaje con su médico para mantener un peso saludable o perder peso. Pregúntele cuál es el peso recomendado para usted.  · Realice al menos 30 minutos de ejercicio que haga que se acelere su corazón (ejercicio aeróbico) la mayoría de los días de la semana. Estas actividades pueden incluir caminar, nadar o andar en bicicleta.  · Trabaje con su médico o especialista en alimentación y nutrición (nutricionista) para ajustar su plan alimentario a sus necesidades calóricas personales.  Lectura de las etiquetas de los alimentos  · Verifique en las etiquetas de los alimentos, la cantidad de sodio por porción. Elija alimentos con menos del 5 por ciento del valor diario de sodio. Generalmente, los alimentos con menos de 300 mg de sodio por porción se encuadran dentro de este plan alimentario.  · Para encontrar cereales integrales, busque la palabra "integral" como primera palabra en la lista de ingredientes.  De compras  · Compre productos en los que en su etiqueta diga: “bajo contenido de sodio” o “sin agregado de sal”.   · Compre alimentos frescos. Evite los alimentos enlatados y comidas precocidas o congeladas.  Cocción  · Evite agregar sal cuando cocine. Use hierbas o aderezos sin sal, en lugar de sal de mesa o sal marina. Consulte al médico o farmacéutico antes de usar sustitutos de la sal.  · No fría los alimentos. A la hora de cocinar los alimentos opte por hornearlos, hervirlos, grillarlos y asarlos a la parrilla.  · Cocine con aceites cardiosaludables, como oliva, canola, soja o girasol.  Planificación de las comidas    · Consuma una dieta equilibrada, que incluya lo siguiente:  ? 5 o más porciones de frutas y verduras por día. Trate de que la mitad del plato de cada comida sean frutas y verduras.  ? Hasta 6 u 8 porciones de cereales integrales por día.  ? Menos de 6 onzas de carne, aves o pescado magros por día. Una porción de 3 onzas de carne tiene casi el mismo tamaño que un mazo de cartas. Un huevo equivale a 1 onza.  ? Dos porciones de productos lácteos descremados por día.  ? Una porción de frutos secos, semillas o frijoles 5 veces por semana.  ? Grasas cardiosaludables. Las grasas saludables llamadas ácidos grasos omega-3 se encuentran en alimentos como semillas de lino y pescados de agua fría, como por ejemplo, sardinas, salmón y caballa.  · Limite la cantidad que ingiere de los siguientes alimentos:  ? Alimentos enlatados o envasados.  ? Alimentos con alto contenido de grasa trans, como alimentos fritos.  ? Alimentos con alto contenido de grasa saturada, como carne con   grasa.  ? Dulces, postres, bebidas azucaradas y otros alimentos con azúcar agregada.  ? Productos lácteos enteros.  · No le agregue sal a los alimentos antes de probarlos.  · Trate de comer al menos 2 comidas vegetarianas por semana.  · Consuma más comida casera y menos de restaurante, de bufés y comida rápida.  · Cuando coma en un restaurante, pida que preparen su comida con menos sal o, en lo posible, sin nada de sal.  ¿Qué alimentos se recomiendan?   Los alimentos enumerados a continuación no constituyen una lista completa. Hable con el nutricionista sobre las mejores opciones alimenticias para usted.  Cereales  Pan de salvado o integral. Pasta de salvado o integral. Arroz integral. Avena. Quinua. Trigo burgol. Cereales integrales y con bajo contenido de sodio. Pan pita. Galletitas de agua con bajo contenido de grasa y sodio. Tortillas de harina integral.  Verduras  Verduras frescas o congeladas (crudas, al vapor, asadas o grilladas). Jugos de tomate y verduras con bajo contenido de sodio o reducidos en sodio. Salsa y pasta de tomate con bajo contenido de sodio o reducidas en sodio. Verduras enlatadas con bajo contenido de sodio o reducidas en sodio.  Frutas  Todas las frutas frescas, congeladas o disecadas. Frutas enlatadas en jugo natural (sin agregado de azúcar).  Carne y otros alimentos proteicos  Pollo o pavo sin piel. Carne de pollo o de pavo molida. Cerdo desgrasado. Pescado y mariscos. Claras de huevo. Porotos, guisantes o lentejas secos. Frutos secos, mantequilla de frutos secos y semillas sin sal. Frijoles enlatados sin sal. Cortes de carne vacuna magra, desgrasada. Embutidos magros, con bajo contenido de sodio.  Lácteos  Leche descremada (1 %) o descremada. Quesos sin grasa, con bajo contenido de grasa o descremados. Queso blanco o ricota sin grasa, con bajo contenido de sodio. Yogur semidescremado o descremado. Queso con bajo contenido de grasa y sodio.  Grasas y aceites  Margarinas untables que no contengan grasas trans. Aceite vegetal. Mayonesa y aderezos para ensaladas livianos o con bajo contenido de grasas (reducidos en sodio). Aceite de canola, cártamo, oliva, soja y girasol. [place].  Condimentos y otros alimentos  Hierbas. Especias. Mezclas de condimentos sin sal. Palomitas de maíz y pretzels sin sal. Dulces con bajo contenido de grasas.  ¿Qué alimentos no se recomiendan?   Los alimentos enumerados a continuación no constituyen una lista completa. Hable con el nutricionista sobre las mejores opciones alimenticias para usted.  Cereales  Productos de panificación hechos con grasa, como medialunas, magdalenas y algunos panes. Comidas con arroz o pasta seca listas para usar.  Verduras  Verduras con crema o fritas. Verduras en salsa de queso. Verduras enlatadas regulares (que no sean con bajo contenido de sodio o reducidas en sodio). Pasta y salsa de tomates enlatadas regulares (que no sean con bajo contenido de sodio o reducidas en sodio). Jugos de tomate y verduras regulares (que no sean con bajo contenido de sodio o reducidos en sodio). Pepinillos. Aceitunas.  Frutas  Fruta enlatada en almíbar liviano o espeso. Frutas cocidas en aceite. Frutas con salsa de crema o manteca.  Carne y otros alimentos proteicos  Cortes de carne con grasa. Costillas. Carne frita. Tocino. Salchichas. Mortadela y otras carnes procesadas. Salame. Panceta. Perros calientes (hotdogs). Salchicha de cerdo. Frutos secos y semillas con sal. Frijoles enlatados con agregado de sal. Pescado enlatado o ahumado. Huevos enteros o yemas. Pollo o pavo con piel.  Lácteos  Leche entera o al 2 %, crema y mitad leche y mitad crema. Queso crema entero   o con toda su grasa. Yogur entero o endulzado. Quesos con toda su grasa. Sustitutos de cremas no lácteas. Coberturas batidas. Quesos para untar y quesos procesados.  Grasas y aceites  Mantequilla. Margarina en barra. Manteca de cerdo. Materia grasa. Mantequilla clarificada. Grasa de panceta. Aceites tropicales como aceite de coco, palmiste o palma.  Condimentos y otros alimentos  Palomitas de maíz y pretzels con sal. Sal de cebolla, sal de ajo, sal condimentada, sal de mesa y sal marina. Salsa [place]. Salsa tártara. Salsa barbacoa. Salsa teriyaki. Salsa de soja, incluso la que tiene contenido reducido de sodio. Salsa de carne. Salsas en lata y  envasadas. Salsa de pescado. Salsa de ostras. Salsa rosada. Rábano picante envasado. [place]ét[place]up. Mostaza. Saborizantes y tiernizantes para carne. Caldo en cubitos. Salsa picante y salsa tabasco. Escabeches envasados o ya preparados. Aderezos para tacos prefabricados o envasados. Salsas. Aderezos comunes para ensalada.  Dónde encontrar más información:  · Instit[employer] Nacional del [place], los Pulmones y la Sangre (National Heart, Lung, and Blood Institute): www.nhlbi.nih.gov  · Asociación Est[place]ense del [place] (American Heart Association): www.heart.org  Resumen  · El plan de alimentación DASH ha demostrado bajar la presión arterial elevada (hipertensión). También puede reducir el riesgo de diabetes tipo 2, enfermedad cardíaca y accidente cerebrovascular.  · Con el plan de alimentación DASH, deberá limitar el consumo de sal (sodio) a 2,300 mg por día. Si tiene hipertensión, es posible que necesite reducir la ingesta de sodio a 1,500 mg por día.  · Cuando siga el plan de alimentación DASH, trate de comer más frutas frescas y verduras, cereales integrales, carnes magras, lácteos descremados y grasas cardiosaludables.  · Trabaje con su médico o especialista en alimentación y nutrición (nutricionista) para ajustar su plan alimentario a sus necesidades calóricas personales.  Esta información no tiene como fin reemplazar el consejo del médico. Asegúrese de hacerle al médico cualquier pregunta que tenga.  Document Released: 08/22/2011 Document Revised: 12/23/2016 Document Reviewed: 12/23/2016  Elsevier Interactive Patient Education © 2018 Elsevier Inc.

## 2018-09-15 ENCOUNTER — Telehealth (INDEPENDENT_AMBULATORY_CARE_PROVIDER_SITE_OTHER): Payer: Self-pay | Admitting: Physician Assistant

## 2018-09-15 ENCOUNTER — Other Ambulatory Visit (INDEPENDENT_AMBULATORY_CARE_PROVIDER_SITE_OTHER): Payer: Self-pay | Admitting: Physician Assistant

## 2018-09-15 DIAGNOSIS — M545 Low back pain, unspecified: Secondary | ICD-10-CM

## 2018-09-15 MED ORDER — IBUPROFEN 800 MG PO TABS: 800.0000 mg | ORAL_TABLET | Freq: Three times a day (TID) | ORAL | 0 refills | Status: DC | PRN

## 2018-09-15 NOTE — Telephone Encounter (Signed)
[facility] Maryjean Mornempestt S Roberts, CMA

## 2018-09-15 NOTE — Telephone Encounter (Signed)
FWD to PCP. Tempestt S Roberts, CMA  

## 2018-09-15 NOTE — Telephone Encounter (Signed)
Patient called to request medication refill for   ibuprofen (ADVIL,MOTRIN) 800 MG tablet   Patient uses [facility] [place]  Please advise [phone]  Thank you Austin [name]

## 2018-09-15 NOTE — Telephone Encounter (Signed)
Refill for Ibuprofen made and sent to [facility].

## 2018-09-18 ENCOUNTER — Other Ambulatory Visit: Payer: Self-pay | Admitting: *Deleted

## 2018-09-18 DIAGNOSIS — I700 Atherosclerosis of aorta: Secondary | ICD-10-CM

## 2018-09-18 DIAGNOSIS — I7410 Embolism and thrombosis of unspecified parts of aorta: Principal | ICD-10-CM

## 2018-10-05 ENCOUNTER — Ambulatory Visit (INDEPENDENT_AMBULATORY_CARE_PROVIDER_SITE_OTHER): Payer: Medicare Other | Admitting: Internal Medicine

## 2018-11-02 ENCOUNTER — Ambulatory Visit (INDEPENDENT_AMBULATORY_CARE_PROVIDER_SITE_OTHER): Payer: Medicare HMO | Admitting: Primary Care

## 2018-11-02 ENCOUNTER — Encounter (INDEPENDENT_AMBULATORY_CARE_PROVIDER_SITE_OTHER): Payer: Self-pay | Admitting: Primary Care

## 2018-11-02 ENCOUNTER — Other Ambulatory Visit: Payer: Self-pay

## 2018-11-02 VITALS — BP 152/86 | HR 76 | Temp 97.6°F | Ht 67.0 in | Wt 196.4 lb

## 2018-11-02 DIAGNOSIS — I7410 Embolism and thrombosis of unspecified parts of aorta: Secondary | ICD-10-CM

## 2018-11-02 DIAGNOSIS — M545 Low back pain, unspecified: Secondary | ICD-10-CM

## 2018-11-02 DIAGNOSIS — I10 Essential (primary) hypertension: Secondary | ICD-10-CM

## 2018-11-02 DIAGNOSIS — G546 Phantom limb syndrome with pain: Secondary | ICD-10-CM

## 2018-11-02 DIAGNOSIS — N529 Male erectile dysfunction, unspecified: Secondary | ICD-10-CM | POA: Diagnosis not present

## 2018-11-02 DIAGNOSIS — E782 Mixed hyperlipidemia: Secondary | ICD-10-CM

## 2018-11-02 DIAGNOSIS — I700 Atherosclerosis of aorta: Secondary | ICD-10-CM

## 2018-11-02 MED ORDER — AMLODIPINE BESYLATE 10 MG PO TABS: 10.0000 mg | ORAL_TABLET | Freq: Every day | ORAL | 1 refills | Status: DC

## 2018-11-02 MED ORDER — ATORVASTATIN CALCIUM 20 MG PO TABS: 20.0000 mg | ORAL_TABLET | Freq: Every day | ORAL | 1 refills | Status: DC

## 2018-11-02 MED ORDER — IBUPROFEN 800 MG PO TABS: 800.0000 mg | ORAL_TABLET | Freq: Three times a day (TID) | ORAL | 0 refills | Status: DC | PRN

## 2018-11-02 MED ORDER — LOSARTAN POTASSIUM 50 MG PO TABS: 50.0000 mg | ORAL_TABLET | Freq: Every day | ORAL | 3 refills | Status: DC

## 2018-11-02 MED ORDER — METOPROLOL SUCCINATE ER 25 MG PO TB24: 25.0000 mg | ORAL_TABLET | Freq: Every day | ORAL | 1 refills | Status: DC

## 2018-11-02 MED ORDER — GABAPENTIN 800 MG PO TABS: 800.0000 mg | ORAL_TABLET | Freq: Three times a day (TID) | ORAL | 1 refills | Status: DC

## 2018-11-02 NOTE — Patient Instructions (Signed)

## 2018-11-02 NOTE — Progress Notes (Signed)
Established Patient Office Visit  Subjective:  Patient ID: Austin [name], male    DOB: [birth year]  Age: 68 y.o. MRN: [id number]  CC:  Chief Complaint  Patient presents with  . Follow-up    HTN    HPI Austin [name] presents for routine f/u. He history of HTN, aortic occlusion resolved , amputation of left hallux,and epididymitis. Elevated BP attributed to diet however, he has agreed with his wife he will improve eating habits.. All medication  refilled.    Past Medical History:  Diagnosis Date  . Back pain   . Hypertension     Past Surgical History:  Procedure Laterality Date  . AMPUTATION Left 01/07/2018   Procedure: AMPUTATION  GREAT TOE LEFT FOOT;  Surgeon: Larina Earthly, MD;  Location: [facility] OR;  Service: Vascular;  Laterality: Left;  . AORTA - BILATERAL FEMORAL ARTERY BYPASS GRAFT N/A 12/03/2017   Procedure: AORTA BIFEMORAL BYPASS GRAFT USING 17X7 MM X 40CM HEMASHIELD GOLD GRAFT REIMPLANTATION IMA;  Surgeon: Nada Libman, MD;  Location: [facility] OR;  Service: Vascular;  Laterality: N/A;  . APPLICATION OF WOUND VAC Bilateral 12/03/2017   Procedure: APPLICATION OF WOUND VAC;  Surgeon: Nada Libman, MD;  Location: [facility] OR;  Service: Vascular;  Laterality: Bilateral;  . BACK SURGERY     lumbar area  . COLONOSCOPY    . LEFT HEART CATH AND CORONARY ANGIOGRAPHY N/A 12/01/2017   Procedure: LEFT HEART CATH AND CORONARY ANGIOGRAPHY;  Surgeon: Elder Negus, MD;  Location: [facility] INVASIVE CV LAB;  Service: Cardiovascular;  Laterality: N/A;  . TOTAL HIP ARTHROPLASTY     bil    Family History  Problem Relation Age of Onset  . Cancer Mother     Social History   Socioeconomic History  . Marital status: Single    Spouse name: Not on file  . Number of children: Not on file  . Years of education: Not on file  . Highest education level: Not on file  Occupational History  . Not on file  Social Needs  . Financial resource strain: Not on file  . Food insecurity:    Worry: Not on  file    Inability: Not on file  . Transportation needs:    Medical: Not on file    Non-medical: Not on file  Tobacco Use  . Smoking status: Current Every Day Smoker    Packs/day: 0.25    Years: 50.00    Pack years: 12.50  . Smokeless tobacco: Never Used  . Tobacco comment: A little less than 1/2 pk per day.   Substance and Sexual Activity  . Alcohol use: No  . Drug use: Yes    Types: Marijuana  . Sexual activity: Not on file  Lifestyle  . Physical activity:    Days per week: Not on file    Minutes per session: Not on file  . Stress: Not on file  Relationships  . Social connections:    Talks on phone: Not on file    Gets together: Not on file    Attends religious service: Not on file    Active member of club or organization: Not on file    Attends meetings of clubs or organizations: Not on file    Relationship status: Not on file  . Intimate partner violence:    Fear of current or ex partner: Not on file    Emotionally abused: Not on file    Physically abused: Not on file  Forced sexual activity: Not on file  Other Topics Concern  . Not on file  Social History Narrative  . Not on file    Outpatient Medications Prior to Visit  Medication Sig Dispense Refill  . aspirin EC 81 MG tablet Take 1 tablet (81 mg total) by mouth daily. 90 tablet 3  . tadalafil, PAH, (ADCIRCA) 20 MG tablet Take 20 mg by mouth as needed.    Marland Kitchen. amLODipine (NORVASC) 10 MG tablet Take 1 tablet (10 mg total) by mouth daily. 90 tablet 1  . atorvastatin (LIPITOR) 20 MG tablet Take 1 tablet (20 mg total) by mouth daily. 90 tablet 1  . gabapentin (NEURONTIN) 800 MG tablet Take 1 tablet (800 mg total) by mouth 3 (three) times daily. 270 tablet 1  . ibuprofen (ADVIL,MOTRIN) 800 MG tablet Take 1 tablet (800 mg total) by mouth every 8 (eight) hours as needed. 21 tablet 0  . losartan (COZAAR) 50 MG tablet Take 1 tablet (50 mg total) by mouth daily. 90 tablet 3  . metoprolol succinate (TOPROL-XL) 25 MG 24 hr  tablet Take 1 tablet (25 mg total) by mouth daily. 90 tablet 1   No facility-administered medications prior to visit.     No Known Allergies  ROS Review of Systems  Constitutional: Negative.   HENT: Negative.   Eyes: Negative.   Respiratory: Negative.   Gastrointestinal: Negative.   Endocrine: Negative.   Genitourinary: Negative.   Musculoskeletal: Positive for back pain.  Skin: Negative.   Allergic/Immunologic: Negative.   Neurological: Negative.   Hematological: Negative.   Psychiatric/Behavioral: Negative.       Objective:    Physical Exam  Constitutional: He is oriented to person, place, and time. He appears well-developed.  Eyes: Pupils are equal, round, and reactive to light. EOM are normal.  Neck: Normal range of motion.  Cardiovascular: Normal rate.  Pulmonary/Chest: Effort normal and breath sounds normal.  Abdominal: Soft.  Musculoskeletal: Normal range of motion.  Neurological: He is alert and oriented to person, place, and time.  Skin: Skin is warm and dry.    BP (!) 152/86 (BP Location: Right Arm, Patient Position: Sitting, Cuff Size: Normal)   Pulse 76   Temp 97.6 F (36.4 C) (Oral)   Ht 5\' 7"  (1.702 m)   Wt 196 lb 6.4 oz (89.1 kg)   SpO2 94%   BMI 30.76 kg/m  Wt Readings from Last 3 Encounters:  11/02/18 196 lb 6.4 oz (89.1 kg)  08/03/18 197 lb 12.8 oz (89.7 kg)  06/22/18 193 lb 6.4 oz (87.7 kg)     There are no preventive care reminders to display for this patient.  There are no preventive care reminders to display for this patient.  Lab Results  Component Value Date   TSH 1.550 03/30/2018   Lab Results  Component Value Date   WBC 7.5 12/31/2017   HGB 13.2 12/31/2017   HCT 40.4 12/31/2017   MCV 87.8 12/31/2017   PLT 242 12/31/2017   Lab Results  Component Value Date   NA 140 06/22/2018   K 4.4 06/22/2018   CO2 22 06/22/2018   GLUCOSE 84 06/22/2018   BUN 16 06/22/2018   CREATININE 1.17 06/22/2018   BILITOT 0.4 06/22/2018    ALKPHOS 86 06/22/2018   AST 20 06/22/2018   ALT 15 06/22/2018   PROT 6.5 06/22/2018   ALBUMIN 4.2 06/22/2018   CALCIUM 9.2 06/22/2018   ANIONGAP 11 12/31/2017   Lab Results  Component Value Date  CHOL 172 06/22/2018   Lab Results  Component Value Date   HDL 38 (L) 06/22/2018   Lab Results  Component Value Date   LDLCALC 112 (H) 06/22/2018   Lab Results  Component Value Date   TRIG 110 06/22/2018   Lab Results  Component Value Date   CHOLHDL 4.5 06/22/2018   Lab Results  Component Value Date   HGBA1C 5.6 06/22/2018      Assessment & Plan:   Problem List Items Addressed This Visit    Hypertension - Primary   Relevant Medications   Refilled Bp meds , discussed salt/seasoning and fried foods    amLODipine (NORVASC) 10 MG tablet   atorvastatin (LIPITOR) 20 MG tablet   losartan (COZAAR) 50 MG tablet   metoprolol succinate (TOPROL-XL) 25 MG 24 hr tablet   Aortic occlusion (HCC)   Resolved     Erectile dysfunction, unspecified erectile dysfunction type    Refilled  Tadalafil alpha blocker indicated for ED and BPH/HTN    Phantom limb pain (HCC)    Amputated right great toe   States    gabapentin (NEURONTIN) 800 MG tablet helps    Acute bilateral low back pain without sciatica       Relevant Medications   ibuprofen (ADVIL,MOTRIN) 800 MG tablet      Meds ordered this encounter  Medications  . amLODipine (NORVASC) 10 MG tablet    Sig: Take 1 tablet (10 mg total) by mouth daily.    Dispense:  90 tablet    Refill:  1    Refills per PCP  . atorvastatin (LIPITOR) 20 MG tablet    Sig: Take 1 tablet (20 mg total) by mouth daily.    Dispense:  90 tablet    Refill:  1  . gabapentin (NEURONTIN) 800 MG tablet    Sig: Take 1 tablet (800 mg total) by mouth 3 (three) times daily.    Dispense:  270 tablet    Refill:  1  . ibuprofen (ADVIL,MOTRIN) 800 MG tablet    Sig: Take 1 tablet (800 mg total) by mouth every 8 (eight) hours as needed.    Dispense:  21 tablet     Refill:  0  . losartan (COZAAR) 50 MG tablet    Sig: Take 1 tablet (50 mg total) by mouth daily.    Dispense:  90 tablet    Refill:  3  . metoprolol succinate (TOPROL-XL) 25 MG 24 hr tablet    Sig: Take 1 tablet (25 mg total) by mouth daily.    Dispense:  90 tablet    Refill:  1    Please consider 90 day supplies to promote better adherence    Follow-up: Return in about 4 weeks (around 11/30/2018) for HTN/ bring BP log .    Grayce Sessions, NP

## 2018-11-09 ENCOUNTER — Ambulatory Visit (HOSPITAL_COMMUNITY)
Admission: RE | Admit: 2018-11-09 | Discharge: 2018-11-09 | Disposition: A | Discharge status: Home / Self Care | Payer: Medicare HMO | Source: Ambulatory Visit | Attending: Surgery | Admitting: Surgery

## 2018-11-09 ENCOUNTER — Ambulatory Visit (INDEPENDENT_AMBULATORY_CARE_PROVIDER_SITE_OTHER)
Admission: RE | Admit: 2018-11-09 | Discharge: 2018-11-09 | Disposition: A | Discharge status: Home / Self Care | Payer: Medicare HMO | Source: Ambulatory Visit | Attending: Surgery | Admitting: Surgery

## 2018-11-09 ENCOUNTER — Ambulatory Visit (INDEPENDENT_AMBULATORY_CARE_PROVIDER_SITE_OTHER): Payer: Medicare HMO | Admitting: Physician Assistant

## 2018-11-09 ENCOUNTER — Encounter: Payer: Self-pay | Admitting: Physician Assistant

## 2018-11-09 ENCOUNTER — Other Ambulatory Visit: Payer: Self-pay

## 2018-11-09 VITALS — BP 145/86 | HR 58 | Temp 97.4°F | Resp 18 | Ht 67.0 in | Wt 195.2 lb

## 2018-11-09 DIAGNOSIS — I7410 Embolism and thrombosis of unspecified parts of aorta: Secondary | ICD-10-CM

## 2018-11-09 DIAGNOSIS — I700 Atherosclerosis of aorta: Secondary | ICD-10-CM

## 2018-11-09 NOTE — Progress Notes (Signed)
[facility] [place] HISTORY AND PHYSICAL   History of Present Illness:  Austin [name] a 68 y.o.malewho is s/p aortobifemoral bypasson 12/03/2017. This was done for a gangrenous left great toe which has been present for approximately 2 months. We elected to let the toe demarcate and see if it had an option for improving after his bypass surgery.Unfortunately, his toe did not improve and on 01-07-2018, he underwent left great toe amputation. There was good bleeding at the time of the operation.   He has healed his left amputation site well and no new open wounds.  He walks more than a mile at a time daily.    He is here for repeat carotid duplex.  He denies amaurosis, weakness and aphasia.        Past Medical History:  Diagnosis Date  . Back pain   . Hypertension     Past Surgical History:  Procedure Laterality Date  . AMPUTATION Left 01/07/2018   Procedure: AMPUTATION  GREAT TOE LEFT FOOT;  Surgeon: Larina Earthly, MD;  Location: [facility] OR;  Service: Vascular;  Laterality: Left;  . AORTA - BILATERAL FEMORAL ARTERY BYPASS GRAFT N/A 12/03/2017   Procedure: AORTA BIFEMORAL BYPASS GRAFT USING 17X7 MM X 40CM HEMASHIELD GOLD GRAFT REIMPLANTATION IMA;  Surgeon: Nada Libman, MD;  Location: [facility] OR;  Service: Vascular;  Laterality: N/A;  . APPLICATION OF WOUND VAC Bilateral 12/03/2017   Procedure: APPLICATION OF WOUND VAC;  Surgeon: Nada Libman, MD;  Location: [facility] OR;  Service: Vascular;  Laterality: Bilateral;  . BACK SURGERY     lumbar area  . COLONOSCOPY    . LEFT HEART CATH AND CORONARY ANGIOGRAPHY N/A 12/01/2017   Procedure: LEFT HEART CATH AND CORONARY ANGIOGRAPHY;  Surgeon: Elder Negus, MD;  Location: [facility] INVASIVE CV LAB;  Service: Cardiovascular;  Laterality: N/A;  . TOTAL HIP ARTHROPLASTY     bil    ROS:   General:  No weight loss, Fever, chills  HEENT: No recent headaches, no nasal bleeding, no visual changes, no sore throat  Neurologic: No  dizziness, blackouts, seizures. No recent symptoms of stroke or mini- stroke. No recent episodes of slurred speech, or temporary blindness.  Cardiac: No recent episodes of chest pain/pressure, no shortness of breath at rest.  No shortness of breath with exertion.  Denies history of atrial fibrillation or irregular heartbeat  Vascular: No history of rest pain in feet.  No history of claudication.  No history of non-healing ulcer, No history of DVT   Pulmonary: No home oxygen, no productive cough, no hemoptysis,  No asthma or wheezing  Musculoskeletal:  [ ]  Arthritis, [ ]  Low back pain,  [ ]  Joint pain  Hematologic:No history of hypercoagulable state.  No history of easy bleeding.  No history of anemia  Gastrointestinal: No hematochezia or melena,  No gastroesophageal reflux, no trouble swallowing  Urinary: [ ]  chronic Kidney disease, [ ]  on HD - [ ]  MWF or [ ]  TTHS, [ ]  Burning with urination, [ ]  Frequent urination, [ ]  Difficulty urinating;   Skin: No rashes  Psychological: No history of anxiety,  No history of depression  Social History Social History   Tobacco Use  . Smoking status: Current Every Day Smoker    Packs/day: 0.25    Years: 50.00    Pack years: 12.50  . Smokeless tobacco: Never Used  . Tobacco comment: A little less than 1/2 pk per day.   Substance Use Topics  .  Alcohol use: No  . Drug use: Yes    Types: Marijuana    Family History Family History  Problem Relation Age of Onset  . Cancer Mother     Allergies  No Known Allergies   Current Outpatient Medications  Medication Sig Dispense Refill  . amLODipine (NORVASC) 10 MG tablet Take 1 tablet (10 mg total) by mouth daily. 90 tablet 1  . atorvastatin (LIPITOR) 20 MG tablet Take 1 tablet (20 mg total) by mouth daily. 90 tablet 1  . gabapentin (NEURONTIN) 800 MG tablet Take 1 tablet (800 mg total) by mouth 3 (three) times daily. 270 tablet 1  . ibuprofen (ADVIL,MOTRIN) 800 MG tablet Take 1 tablet (800 mg  total) by mouth every 8 (eight) hours as needed. 21 tablet 0  . losartan (COZAAR) 50 MG tablet Take 1 tablet (50 mg total) by mouth daily. 90 tablet 3  . metoprolol succinate (TOPROL-XL) 25 MG 24 hr tablet Take 1 tablet (25 mg total) by mouth daily. 90 tablet 1  . tadalafil, PAH, (ADCIRCA) 20 MG tablet Take 20 mg by mouth as needed.    Marland Kitchen aspirin EC 81 MG tablet Take 1 tablet (81 mg total) by mouth daily. (Patient not taking: Reported on 11/09/2018) 90 tablet 3   No current facility-administered medications for this visit.     Physical Examination  Vitals:   11/09/18 1357  BP: (!) 145/86  Pulse: (!) 58  Resp: 18  Temp: (!) 97.4 F (36.3 C)  TempSrc: Oral  SpO2: 95%  Weight: 195 lb 3.5 oz (88.6 kg)  Height:  (1.702 m)    Body mass index is 30.58 kg/m.  General:  Alert and oriented, no acute distress HEENT: Normal Neck: No bruit or JVD Pulmonary: Clear to auscultation bilaterally Cardiac: Regular Rate and Rhythm without murmur Abdomen: Soft, non-tender, non-distended, no mass, no scars Skin: No rash Extremity Pulses:  2+ radial, brachial, femoral, dorsalis pedis, posterior tibial pulses bilaterally Musculoskeletal: No deformity or edema  Neurologic: Upper and lower extremity motor 5/5 and symmetric  DATA:  ABI Findings: +---------+------------------+-----+---------+--------+ Right    Rt Pressure (mmHg)IndexWaveform Comment  +---------+------------------+-----+---------+--------+ Brachial 159                                      +---------+------------------+-----+---------+--------+ ATA      149               0.94 triphasic         +---------+------------------+-----+---------+--------+ PTA      150               0.94 triphasic         +---------+------------------+-----+---------+--------+ Great Toe114               0.72                    +---------+------------------+-----+---------+--------+  +--------+------------------+-----+----------+-------+ Left    Lt Pressure (mmHg)IndexWaveform  Comment +--------+------------------+-----+----------+-------+ [id number]                                      +--------+------------------+-----+----------+-------+ ATA     100               0.63 monophasic        +--------+------------------+-----+----------+-------+ PTA     0  0.00 absent            +--------+------------------+-----+----------+-------+ PERO    103               0.65 monophasic        +--------+------------------+-----+----------+-------+  +-------+-----------+-----------+------------+------------+ ABI/TBIToday's ABIToday's TBIPrevious ABIPrevious TBI +-------+-----------+-----------+------------+------------+ Right  0.94       0.72       1.20        0.70         +-------+-----------+-----------+------------+------------+ Left   0.65       amputation 0.78        amputation   +-------+-----------+-----------+------------+------------+   Right Carotid Findings: +----------+--------+--------+--------+--------------------------+--------+           PSV cm/sEDV cm/sStenosisDescribe                  Comments +----------+--------+--------+--------+--------------------------+--------+ CCA Prox  118     16                                                 +----------+--------+--------+--------+--------------------------+--------+ CCA Mid   86      15                                                 +----------+--------+--------+--------+--------------------------+--------+ CCA Distal52      15              smooth and homogeneous             +----------+--------+--------+--------+--------------------------+--------+ ICA Prox  163     43      40-59%  irregular and heterogenous          +----------+--------+--------+--------+--------------------------+--------+ ICA Mid   124     29                                                 +----------+--------+--------+--------+--------------------------+--------+ ICA Distal72      19                                                 +----------+--------+--------+--------+--------------------------+--------+ ECA       121     17      >50%                                       +----------+--------+--------+--------+--------------------------+--------+  +----------+--------+-------+----------------+-------------------+           PSV cm/sEDV cmsDescribe        Arm Pressure (mmHG) +----------+--------+-------+----------------+-------------------+ [id number]             Multiphasic, WNL                    +----------+--------+-------+----------------+-------------------+  +---------+--------+--+--------+---------+ VertebralPSV cm/s51EDV cm/sAntegrade +---------+--------+--+--------+---------+    Left Carotid Findings: +----------+--------+--------+--------+-----------------------+--------+           PSV cm/sEDV cm/sStenosisDescribe  Comments +----------+--------+--------+--------+-----------------------+--------+ CCA Prox  96      19                                              +----------+--------+--------+--------+-----------------------+--------+ CCA Mid   84      20                                              +----------+--------+--------+--------+-----------------------+--------+ CCA Distal49      14              smooth and homogeneous          +----------+--------+--------+--------+-----------------------+--------+ ICA Prox  95      30      1-39%   heterogenous and smooth         +----------+--------+--------+--------+-----------------------+--------+ ICA Mid   101     37      1-39%   heterogenous and smooth          +----------+--------+--------+--------+-----------------------+--------+ ICA Distal123     35                                              +----------+--------+--------+--------+-----------------------+--------+ ECA       133     23      >50%                                    +----------+--------+--------+--------+-----------------------+--------+  +----------+--------+--------+----------------+-------------------+ SubclavianPSV cm/sEDV cm/sDescribe        Arm Pressure (mmHG) +----------+--------+--------+----------------+-------------------+           105             Multiphasic, WNL                    +----------+--------+--------+----------------+-------------------+  +---------+--------+--+--------+--+---------+ VertebralPSV cm/s57EDV cm/s13Antegrade +---------+--------+--+--------+--+---------+    Summary: Right Carotid: Velocities in the right ICA are consistent with a 1-39% stenosis.                The ECA appears >50% stenosed. Peak systolic velocity ratios                suggest >50% ECA stenosis, however no significant plaque is seen                in the proximal ECA.  Left Carotid: Peak systolic velocity ratios suggest >50% ECA stenosis, however               no significant plaque is seen in the proximal ECA. Velocities in               the proximal ICA suggest 1-39% ICA stenosis, high end of range.  Vertebrals:  Bilateral vertebral arteries demonstrate antegrade flow. Subclavians: Normal flow hemodynamics were seen in bilateral subclavian              arteries.   ASSESSMENT:  Status post aortobifemoral bypass graft and left great toe amputation  The left ABI is slightly lower than the right likely secondary to superficial femoral artery stenosis asymptomatic.  Left previous ABI  0.78 now 0.65.    Carotid stenosis < 39% stenosis B ICA  PLAN:  He continues to be asymptomatic and active on a daily basis.  He will f/u for  repeat abi's  In 6 months.  He will call if he has concerns.     Mosetta Pigeon PA-C [facility] [place] Office: [phone]  MD in clinic [place]

## 2019-01-01 ENCOUNTER — Other Ambulatory Visit (INDEPENDENT_AMBULATORY_CARE_PROVIDER_SITE_OTHER): Payer: Self-pay | Admitting: Primary Care

## 2019-01-01 DIAGNOSIS — M545 Low back pain, unspecified: Secondary | ICD-10-CM

## 2019-02-28 IMAGING — DX DG ABD PORTABLE 1V
1 series · 1 of 1 positions shown · non-contrast
Comparison: None in PACs

CLINICAL DATA: Status post femoral bypass surgery.

EXAM:
PORTABLE ABDOMEN - 1 VIEW

[abdomen kub]
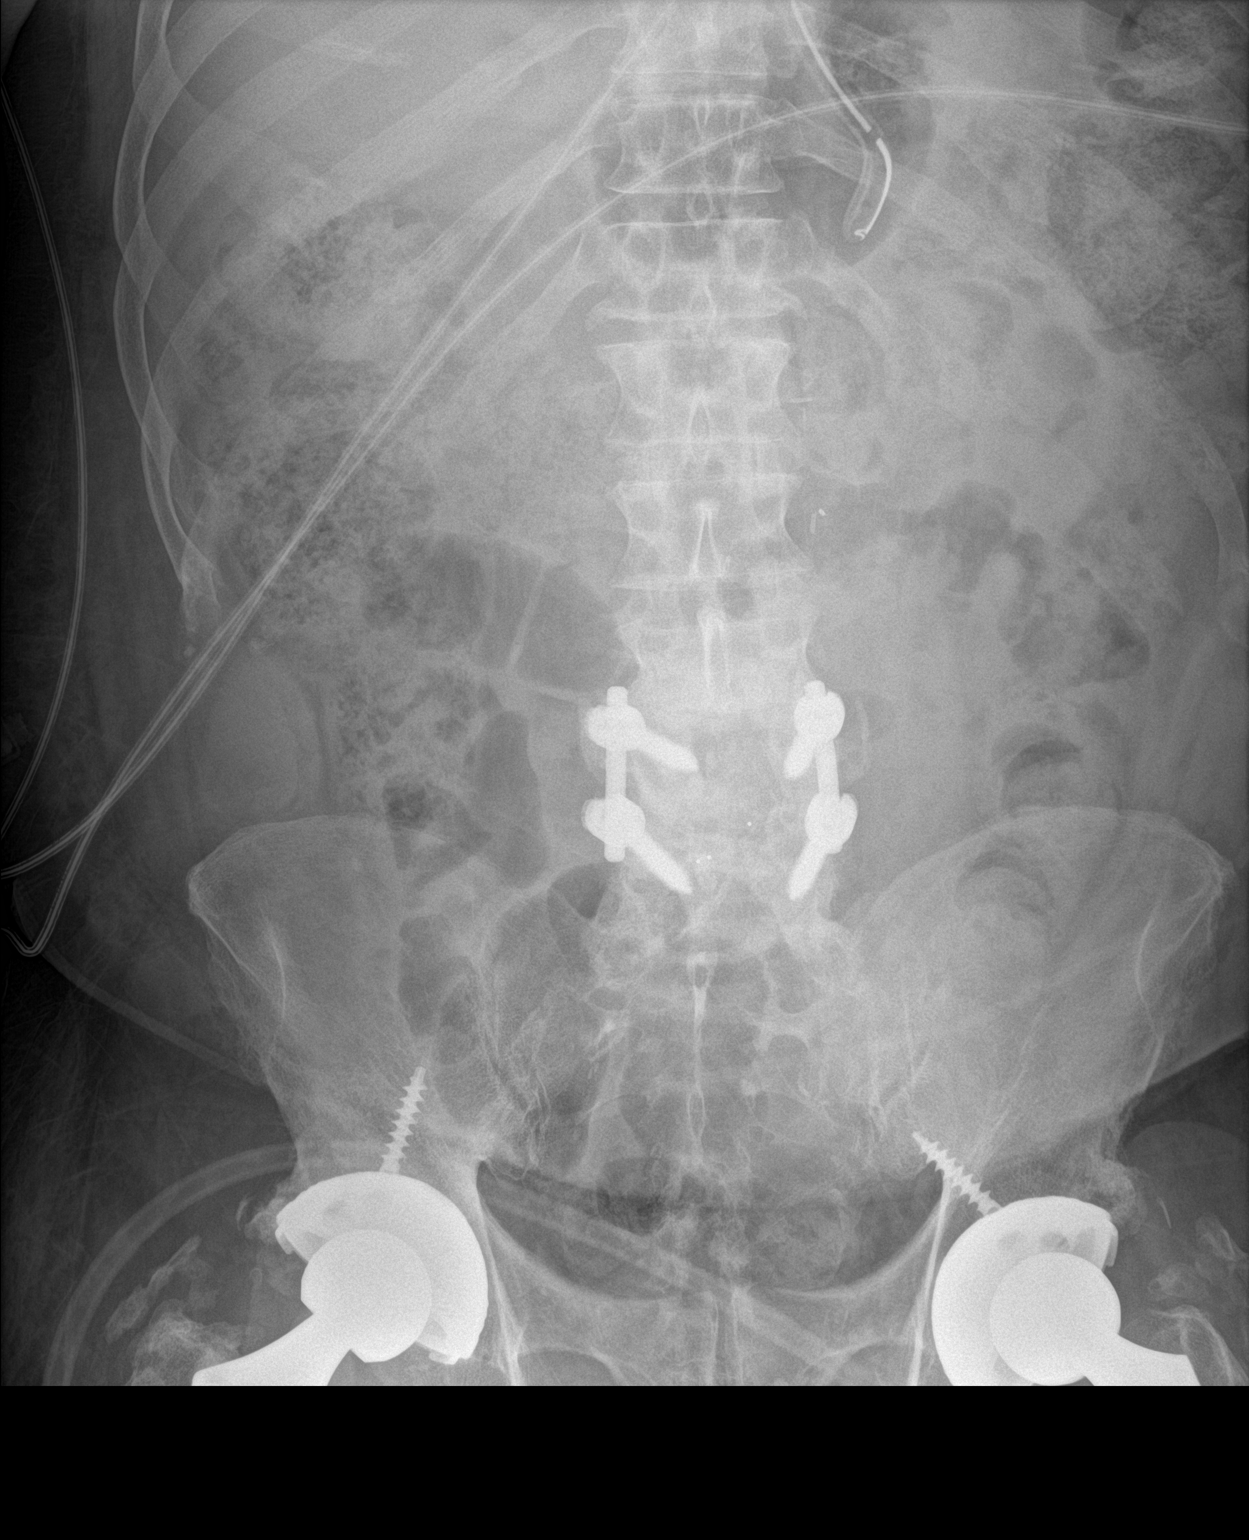

[1 of 1 positions shown; findings below may reference images not displayed]

FINDINGS: The tip and proximal port of the esophagogastric tube lie within the
proximal gastric body. The colonic stool burden is moderate. There
is no small or large bowel obstruction. The patient has undergone
previous PLIF at L4-5 and bilateral hip joint prosthesis placement.
IMPRESSION: The esophagogastric tube tip and proximal port lie in the proximal
gastric body.

## 2019-03-18 ENCOUNTER — Telehealth (INDEPENDENT_AMBULATORY_CARE_PROVIDER_SITE_OTHER): Payer: Self-pay

## 2019-03-18 DIAGNOSIS — N529 Male erectile dysfunction, unspecified: Secondary | ICD-10-CM

## 2019-03-18 MED ORDER — TADALAFIL (PAH) 20 MG PO TABS: 20.0000 mg | ORAL_TABLET | ORAL | 0 refills | Status: AC | PRN

## 2019-03-18 NOTE — Telephone Encounter (Signed)
Patient called requesting refill of Tadalafil sent to [facility]. Refill sent. Patient is aware. Nat Christen, CMA

## 2019-03-21 ENCOUNTER — Other Ambulatory Visit (INDEPENDENT_AMBULATORY_CARE_PROVIDER_SITE_OTHER): Payer: Self-pay | Admitting: Primary Care

## 2019-03-21 DIAGNOSIS — M545 Low back pain, unspecified: Secondary | ICD-10-CM

## 2019-03-22 NOTE — Telephone Encounter (Signed)
FWD to PCP. Austin [name], CMA  

## 2019-03-23 ENCOUNTER — Other Ambulatory Visit (INDEPENDENT_AMBULATORY_CARE_PROVIDER_SITE_OTHER): Payer: Self-pay | Admitting: Primary Care

## 2019-04-19 ENCOUNTER — Ambulatory Visit (INDEPENDENT_AMBULATORY_CARE_PROVIDER_SITE_OTHER): Payer: Medicare HMO | Admitting: Primary Care

## 2019-04-26 ENCOUNTER — Ambulatory Visit (INDEPENDENT_AMBULATORY_CARE_PROVIDER_SITE_OTHER): Payer: Medicare HMO | Admitting: Primary Care

## 2019-04-26 ENCOUNTER — Other Ambulatory Visit: Payer: Self-pay

## 2019-04-26 ENCOUNTER — Encounter (INDEPENDENT_AMBULATORY_CARE_PROVIDER_SITE_OTHER): Payer: Self-pay | Admitting: Primary Care

## 2019-04-26 VITALS — BP 160/91 | HR 63 | Temp 97.6°F | Ht 67.0 in | Wt 191.6 lb

## 2019-04-26 DIAGNOSIS — I10 Essential (primary) hypertension: Secondary | ICD-10-CM | POA: Diagnosis not present

## 2019-04-26 DIAGNOSIS — F17200 Nicotine dependence, unspecified, uncomplicated: Secondary | ICD-10-CM

## 2019-04-26 DIAGNOSIS — F5221 Male erectile disorder: Secondary | ICD-10-CM

## 2019-04-26 DIAGNOSIS — R6882 Decreased libido: Secondary | ICD-10-CM

## 2019-04-26 DIAGNOSIS — E782 Mixed hyperlipidemia: Secondary | ICD-10-CM

## 2019-04-26 DIAGNOSIS — R351 Nocturia: Secondary | ICD-10-CM | POA: Diagnosis not present

## 2019-04-26 DIAGNOSIS — G546 Phantom limb syndrome with pain: Secondary | ICD-10-CM | POA: Diagnosis not present

## 2019-04-26 MED ORDER — HYDROCHLOROTHIAZIDE 25 MG PO TABS: 25.0000 mg | ORAL_TABLET | Freq: Every day | ORAL | 3 refills | Status: AC

## 2019-04-26 MED ORDER — AMLODIPINE BESYLATE 10 MG PO TABS: 10.0000 mg | ORAL_TABLET | Freq: Every day | ORAL | 1 refills | Status: AC

## 2019-04-26 MED ORDER — METOPROLOL SUCCINATE ER 25 MG PO TB24: 25.0000 mg | ORAL_TABLET | Freq: Every day | ORAL | 1 refills | Status: AC

## 2019-04-26 MED ORDER — LOSARTAN POTASSIUM 50 MG PO TABS: 50.0000 mg | ORAL_TABLET | Freq: Every day | ORAL | 3 refills | Status: AC

## 2019-04-26 MED ORDER — ATORVASTATIN CALCIUM 20 MG PO TABS: 20.0000 mg | ORAL_TABLET | Freq: Every day | ORAL | 1 refills | Status: AC

## 2019-04-26 MED ORDER — GABAPENTIN 800 MG PO TABS: 800.0000 mg | ORAL_TABLET | Freq: Three times a day (TID) | ORAL | 1 refills | Status: AC
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
## 2019-04-26 NOTE — Patient Instructions (Signed)

## 2019-04-27 LAB — COMPREHENSIVE METABOLIC PANEL
ALT: 13 IU/L (ref 0–44)
AST: 16 IU/L (ref 0–40)
Albumin/Globulin Ratio: 1.6 (ref 1.2–2.2)
Albumin: 4 g/dL (ref 3.8–4.8)
Alkaline Phosphatase: 95 IU/L (ref 39–117)
BUN/Creatinine Ratio: 14 (ref 10–24)
BUN: 13 mg/dL (ref 8–27)
Bilirubin Total: 0.3 mg/dL (ref 0.0–1.2)
CO2: 20 mmol/L (ref 20–29)
Calcium: 9.2 mg/dL (ref 8.6–10.2)
Chloride: 108 mmol/L — ABNORMAL HIGH (ref 96–106)
Creatinine, Ser: 0.95 mg/dL (ref 0.76–1.27)
GFR calc Af Amer: 96 mL/min/{1.73_m2} (ref >59)
GFR calc non Af Amer: 83 mL/min/{1.73_m2} (ref >59)
Globulin, Total: 2.5 g/dL (ref 1.5–4.5)
Glucose: 83 mg/dL (ref 65–99)
Potassium: 4.2 mmol/L (ref 3.5–5.2)
Sodium: 142 mmol/L (ref 134–144)
Total Protein: 6.5 g/dL (ref 6.0–8.5)

## 2019-04-27 LAB — LIPID PANEL
Chol/HDL Ratio: 4.2 ratio (ref 0.0–5.0)
Cholesterol, Total: 162 mg/dL (ref 100–199)
HDL: 39 mg/dL — ABNORMAL LOW (ref >39)
LDL Calculated: 109 mg/dL — ABNORMAL HIGH (ref 0–99)
Triglycerides: 71 mg/dL (ref 0–149)
VLDL Cholesterol Cal: 14 mg/dL (ref 5–40)

## 2019-04-27 LAB — PSA: Prostate Specific Ag, Serum: 0.3 ng/mL (ref 0.0–4.0)

## 2019-04-27 LAB — TESTOSTERONE: Testosterone: 405 ng/dL (ref 264–916)

## 2019-04-29 ENCOUNTER — Other Ambulatory Visit: Payer: Self-pay

## 2019-04-29 DIAGNOSIS — I700 Atherosclerosis of aorta: Secondary | ICD-10-CM

## 2019-05-10 ENCOUNTER — Ambulatory Visit: Payer: Medicare HMO | Admitting: Family

## 2019-05-10 ENCOUNTER — Encounter (HOSPITAL_COMMUNITY): Payer: Medicare HMO

## 2019-05-10 ENCOUNTER — Ambulatory Visit (INDEPENDENT_AMBULATORY_CARE_PROVIDER_SITE_OTHER): Payer: Medicare HMO | Admitting: Primary Care

## 2019-06-04 ENCOUNTER — Telehealth (HOSPITAL_COMMUNITY): Payer: Self-pay

## 2019-06-04 NOTE — Telephone Encounter (Signed)

## 2019-06-07 ENCOUNTER — Encounter: Payer: Self-pay | Admitting: Family

## 2019-06-07 ENCOUNTER — Ambulatory Visit (HOSPITAL_COMMUNITY)
Admission: RE | Admit: 2019-06-07 | Discharge: 2019-06-07 | Disposition: A | Discharge status: Home / Self Care | Payer: Medicare HMO | Source: Ambulatory Visit | Attending: Vascular Surgery | Admitting: Vascular Surgery

## 2019-06-07 ENCOUNTER — Other Ambulatory Visit: Payer: Self-pay

## 2019-06-07 ENCOUNTER — Ambulatory Visit (INDEPENDENT_AMBULATORY_CARE_PROVIDER_SITE_OTHER): Payer: Medicare HMO | Admitting: Family

## 2019-06-07 VITALS — BP 170/85 | HR 51 | Temp 97.7°F | Resp 18 | Ht 67.0 in | Wt 193.0 lb

## 2019-06-07 DIAGNOSIS — I700 Atherosclerosis of aorta: Secondary | ICD-10-CM

## 2019-06-07 DIAGNOSIS — I7410 Embolism and thrombosis of unspecified parts of aorta: Secondary | ICD-10-CM | POA: Diagnosis not present

## 2019-06-07 DIAGNOSIS — F17200 Nicotine dependence, unspecified, uncomplicated: Secondary | ICD-10-CM

## 2019-06-07 NOTE — Progress Notes (Signed)
Virtual Visit via Telephone Note   I connected with Austin [name] on 06/07/2019 using the [employer] by telephone and verified that I was speaking with the correct person using two identifiers. Patient was located at his home and accompanied by a family memver. I am located at the [facility] office/clinic.   The limitations of evaluation and management by telemedicine and the availability of in person appointments have been previously discussed with the patient and are documented in the patients chart. The patient expressed understanding and consented to proceed.  PCP: Grayce SessionsEdwards, Michelle P, NP  Chief Complaint: follow up peripheral artery occlusive disease  History of Present Illness: Austin [name] is a 68 y.o. male who is s/p aortobifemoral bypasson 12/03/2017 by Dr. Myra GianottiBrabham. This was done for a gangrenous left great toe which has been present for approximately 2 months. We elected to let the toe demarcate and see if it had an option for improving after his bypass surgery.Unfortunately, his toe did not improve and on 01-07-2018, he underwent left great toe amputation by Dr. Arbie CookeyEarly. There was good bleeding at the time of the operation.       Pt was last evaluated on 11-09-18 by M. Colllins PA-C. At that time pt had healed his left amputation site well with no new open wounds.  He was walking more than a mile at a time daily.   He had a repeat carotid duplex which showed 40-59% right ICA stenosis and 1-39% left ICA stenosis.  He denied amaurosis, weakness and aphasia.  He walks about a mile daily  Diabetic: No Tobacco use: smoker  (1/2 ppd x for about 50 yrs)  Pt meds include: Statin :Yes Betablocker: Yes ASA: Yes Other anticoagulants/antiplatelets: no    Past Medical History:  Diagnosis Date  . Back pain   . Hypertension     Past Surgical History:  Procedure Laterality Date  . AMPUTATION Left 01/07/2018   Procedure: AMPUTATION  GREAT TOE LEFT FOOT;  Surgeon: Larina EarthlyEarly, Todd F, MD;   [facility];  Service: Vascular;  Laterality: Left;  . AORTA - BILATERAL FEMORAL ARTERY BYPASS GRAFT N/A 12/03/2017   Procedure: AORTA BIFEMORAL BYPASS GRAFT USING 17X7 MM X 40CM HEMASHIELD GOLD GRAFT REIMPLANTATION IMA;  Surgeon: Nada LibmanBrabham, Vance W, MD;  Location: [facility] OR;  Service: Vascular;  Laterality: N/A;  . APPLICATION OF WOUND VAC Bilateral 12/03/2017   Procedure: APPLICATION OF WOUND VAC;  Surgeon: Nada LibmanBrabham, Vance W, MD;  Location: [facility] OR;  Service: Vascular;  Laterality: Bilateral;  . BACK SURGERY     lumbar area  . COLONOSCOPY    . LEFT HEART CATH AND CORONARY ANGIOGRAPHY N/A 12/01/2017   Procedure: LEFT HEART CATH AND CORONARY ANGIOGRAPHY;  Surgeon: Elder NegusPatwardhan, Manish J, MD;  Location: [facility] INVASIVE [facility];  Service: Cardiovascular;  Laterality: N/A;  . TOTAL HIP ARTHROPLASTY     bil   Social History   Socioeconomic History  . Marital status: Single    Spouse name: Not on file  . Number of children: Not on file  . Years of education: Not on file  . Highest education level: Not on file  Occupational History  . Not on file  Social Needs  . Financial resource strain: Not on file  . Food insecurity    Worry: Not on file    Inability: Not on file  . Transportation needs    Medical: Not on file    Non-medical: Not on file  Tobacco Use  . Smoking status: Current  Every Day Smoker    Packs/day: 0.25    Years: 50.00    Pack years: 12.50  . Smokeless tobacco: Never Used  . Tobacco comment: A little less than 1/2 pk per day.   Substance and Sexual Activity  . Alcohol use: No  . Drug use: Yes    Types: Marijuana  . Sexual activity: Not on file  Lifestyle  . Physical activity    Days per week: Not on file    Minutes per session: Not on file  . Stress: Not on file  Relationships  . Social Herbalist on phone: Not on file    Gets together: Not on file    Attends religious service: Not on file    Active member of club or organization: Not on file    Attends  meetings of clubs or organizations: Not on file    Relationship status: Not on file  . Intimate partner violence    Fear of current or ex partner: Not on file    Emotionally abused: Not on file    Physically abused: Not on file    Forced sexual activity: Not on file  Other Topics Concern  . Not on file  Social History Narrative  . Not on file     Current Meds  Medication Sig  . amLODipine (NORVASC) 10 MG tablet Take 1 tablet (10 mg total) by mouth daily.  Marland Kitchen aspirin EC 81 MG tablet Take 1 tablet (81 mg total) by mouth daily.  Marland Kitchen atorvastatin (LIPITOR) 20 MG tablet Take 1 tablet (20 mg total) by mouth daily.  Marland Kitchen gabapentin (NEURONTIN) 800 MG tablet Take 1 tablet (800 mg total) by mouth 3 (three) times daily.  . hydrochlorothiazide (HYDRODIURIL) 25 MG tablet Take 1 tablet (25 mg total) by mouth daily.  Marland Kitchen ibuprofen (ADVIL) 600 MG tablet Take 1 tablet (600 mg total) by mouth every 8 (eight) hours as needed.  Marland Kitchen losartan (COZAAR) 50 MG tablet Take 1 tablet (50 mg total) by mouth daily.  . metoprolol succinate (TOPROL-XL) 25 MG 24 hr tablet Take 1 tablet (25 mg total) by mouth daily.  . tadalafil, PAH, (ADCIRCA) 20 MG tablet Take 1 tablet (20 mg total) by mouth as needed.    12 system ROS was negative unless otherwise noted in HPI   Observations/Objective:  ABI Findings (06-07-19): +---------+------------------+-----+-------------------+--------+ Right    Rt Pressure (mmHg)IndexWaveform           Comment  +---------+------------------+-----+-------------------+--------+ Brachial 168                                                +---------+------------------+-----+-------------------+--------+ PTA      121               0.72 dampened monophasic         +---------+------------------+-----+-------------------+--------+ DP       118               0.70 dampened monophasic         +---------+------------------+-----+-------------------+--------+ Great Toe0                  0.00 Absent                      +---------+------------------+-----+-------------------+--------+  +---------+------------------+-----+-------------------+----------+ Left     Lt Pressure (mmHg)IndexWaveform  Comment    +---------+------------------+-----+-------------------+----------+ Brachial 163                                                  +---------+------------------+-----+-------------------+----------+ PTA      0                 0.00 absent                        +---------+------------------+-----+-------------------+----------+ DP       101               0.60 dampened monophasic           +---------+------------------+-----+-------------------+----------+ Great Toe                                          amputation +---------+------------------+-----+-------------------+----------+  +-------+-----------+-----------+------------+------------+ ABI/TBIToday's ABIToday's TBIPrevious ABIPrevious TBI +-------+-----------+-----------+------------+------------+ Right  0.72       0.00       0.94        0.72         +-------+-----------+-----------+------------+------------+ Left   0.60       amputation 0.65        amputation   +-------+-----------+-----------+------------+------------+  Right ABIs and TBIs appear decreased compared to prior study on 11/09/2018. Left ABIs appear essentially unchanged compared to prior study on 11/09/2018.   Summary: Right: Resting right ankle-brachial index indicates moderate right lower extremity arterial disease. The right toe-brachial index is abnormal. RT great toe pressure = 0 mmHg.  Left: Resting left ankle-brachial index indicates moderate left lower extremity arterial disease.     Assessment and Plan: Austin [name] is a 68 y.o. male who is s/p aortobifemoral bypasson 12/03/2017 by Dr. Myra Gianotti. This was done for a gangrenous left great toe which has been present for  approximately 2 months. We elected to let the toe demarcate and see if it had an option for improving after his bypass surgery.Unfortunately, his toe did not improve and on 01-07-2018, he underwent left great toe amputation by Dr. Arbie Cookey. There was good bleeding at the time of the operation.  He walks about a mile daily with no claudication symptoms.  He states that he has no sores or wounds in his feet or legs.   I discussed with Dr. Myra Gianotti the significant decline in the right LE ABI from 0.94 to 0.72. Will schedule pt to return for bilateral aortoiliac duplex and bilateral LE arterial duplex as soon as possible, see Dr. Myra Gianotti afterward.    Follow Up Instructions:  Will schedule pt to return for bilateral aortoiliac duplex and bilateral LE arterial duplex as soon as possible, see Dr. Myra Gianotti afterward.    I counseled patient re smoking cessation, and patient was mailed some information re smoking cessation.   I discussed the assessment and treatment plan with the patient. The patient was provided an opportunity to ask questions and all were answered. The patient agreed with the plan and demonstrated an understanding of the instructions.   The patient was advised to call back or seek an in-person evaluation if the symptoms worsen or if the condition fails to improve as anticipated.  I spent 18 minutes with the patient via telephone encounter.   Signed, Rosalita Chessman Josanne Boerema  [facility] Office: [phone]  06/07/2019, 12:37 PM

## 2019-06-07 NOTE — Patient Instructions (Signed)

## 2019-07-08 ENCOUNTER — Other Ambulatory Visit: Payer: Self-pay

## 2019-07-08 DIAGNOSIS — I700 Atherosclerosis of aorta: Secondary | ICD-10-CM

## 2019-07-12 ENCOUNTER — Ambulatory Visit: Payer: Medicare HMO | Admitting: Surgery

## 2019-07-12 ENCOUNTER — Encounter (HOSPITAL_COMMUNITY): Payer: Medicare HMO

## 2019-07-12 ENCOUNTER — Inpatient Hospital Stay (HOSPITAL_COMMUNITY): Admission: RE | Admit: 2019-07-12 | Payer: Medicare HMO | Source: Ambulatory Visit

## 2020-03-16 ENCOUNTER — Encounter: Payer: Self-pay | Admitting: Surgery

## 2020-04-15 ENCOUNTER — Other Ambulatory Visit (INDEPENDENT_AMBULATORY_CARE_PROVIDER_SITE_OTHER): Payer: Self-pay | Admitting: Primary Care

## 2020-04-15 DIAGNOSIS — M545 Low back pain, unspecified: Secondary | ICD-10-CM

## 2020-05-01 ENCOUNTER — Other Ambulatory Visit: Payer: Self-pay

## 2020-05-01 DIAGNOSIS — I700 Atherosclerosis of aorta: Secondary | ICD-10-CM

## 2020-05-15 ENCOUNTER — Ambulatory Visit: Payer: Medicare HMO | Admitting: Surgery

## 2020-05-15 ENCOUNTER — Inpatient Hospital Stay (HOSPITAL_COMMUNITY): Admission: RE | Admit: 2020-05-15 | Payer: Medicare HMO | Source: Ambulatory Visit

## 2020-05-20 ENCOUNTER — Other Ambulatory Visit (INDEPENDENT_AMBULATORY_CARE_PROVIDER_SITE_OTHER): Payer: Self-pay | Admitting: Primary Care

## 2020-05-20 DIAGNOSIS — N529 Male erectile dysfunction, unspecified: Secondary | ICD-10-CM

## 2020-05-20 NOTE — Telephone Encounter (Signed)
Requested  medications are  due for refill today yes  Requested medications are on the active medication list yes  Last refill 4/15  Last visit 04/2019   Future visit scheduled no  Notes to clinic: Visit in Aug stated he was not a candidate for this med, no visit in 13 months, no upcoming visit scheduled. Please assess.

## 2020-06-20 ENCOUNTER — Other Ambulatory Visit (INDEPENDENT_AMBULATORY_CARE_PROVIDER_SITE_OTHER): Payer: Self-pay | Admitting: Primary Care

## 2020-06-20 DIAGNOSIS — I10 Essential (primary) hypertension: Secondary | ICD-10-CM

## 2020-06-20 NOTE — Telephone Encounter (Signed)
Requested medications are due for refill today? Yes  Requested medications are on active medication list? Yes  Last Refill:   04/26/2019  # 90 with 3 refills   Future visit scheduled?   No   Notes to Clinic:  Medication failed Rx refill protocol due to no valid encounter in the last 6 months and no labs within the past 180 days.  The last office visit was on 04/26/2019 and the last labs were performed on 04/26/2019.

## 2020-06-20 NOTE — Telephone Encounter (Signed)
Requested medications are due for refill today?  Yes  Requested medications are on active medication list?  Yes  Last Refill:  05/06/2019  # 90 with 3 refills   Future visit scheduled?  No   Notes to Clinic: Medication failed Rx refill protocol due to no valid encounter in the past 6 months and no labs within the past 180 days.  The last office visit was on 04/26/2019 and the last labs were performed on 04/26/2019.

## 2021-05-12 ENCOUNTER — Ambulatory Visit (INDEPENDENT_AMBULATORY_CARE_PROVIDER_SITE_OTHER): Payer: Medicare HMO

## 2021-05-12 ENCOUNTER — Telehealth (INDEPENDENT_AMBULATORY_CARE_PROVIDER_SITE_OTHER): Payer: Self-pay | Admitting: Primary Care

## 2021-05-12 DIAGNOSIS — Z5329 Procedure and treatment not carried out because of patient's decision for other reasons: Secondary | ICD-10-CM

## 2021-05-12 NOTE — Telephone Encounter (Signed)
Called Patient to complete medicare wellness visit. Patient declined to complete the visit as he states he is no longer a Patient of [facility].   Patient states his PCP is no longer Gwinda Passe and that he now sees Dr Fatima Sanger with [facility]

## 2021-05-12 NOTE — Progress Notes (Deleted)
Subjective:   Austin [name] is a 70 y.o. male who presents for an Initial Medicare Annual Wellness Visit.  I connected with  Austin [name] on 05/12/21 by a video enabled telemedicine application and verified that I am speaking with the correct person using two identifiers.   I discussed the limitations of evaluation and management by telemedicine. The patient expressed understanding and agreed to proceed.   Review of Systems    Defer to PCP.       Objective:    There were no vitals filed for this visit. There is no height or weight on file to calculate BMI.  Advanced Directives 05/04/2018 02/02/2018 01/07/2018 11/26/2017 11/19/2017 09/29/2017  Does Patient Have a Medical Advance Directive? No Yes No No No No  Type of Advance Directive - Healthcare Power of Attorney - - - -  Would patient like information on creating a medical advance directive? - - No - Patient declined No - Patient declined No - Patient declined -    Current Medications (verified) Outpatient Encounter Medications as of 05/12/2021  Medication Sig   amLODipine (NORVASC) 10 MG tablet Take 1 tablet (10 mg total) by mouth daily.   aspirin EC 81 MG tablet Take 1 tablet (81 mg total) by mouth daily.   atorvastatin (LIPITOR) 20 MG tablet Take 1 tablet (20 mg total) by mouth daily.   gabapentin (NEURONTIN) 800 MG tablet Take 1 tablet (800 mg total) by mouth 3 (three) times daily.   hydrochlorothiazide (HYDRODIURIL) 25 MG tablet Take 1 tablet (25 mg total) by mouth daily.   ibuprofen (ADVIL) 600 MG tablet Take 1 tablet (600 mg total) by mouth every 8 (eight) hours as needed.   losartan (COZAAR) 50 MG tablet Take 1 tablet (50 mg total) by mouth daily.   metoprolol succinate (TOPROL-XL) 25 MG 24 hr tablet Take 1 tablet (25 mg total) by mouth daily.   tadalafil, PAH, (ADCIRCA) 20 MG tablet Take 1 tablet (20 mg total) by mouth as needed.   No facility-administered encounter medications on file as of 05/12/2021.    Allergies  (verified) Patient has no known allergies.   History: Past Medical History:  Diagnosis Date   Back pain    Hypertension    Past Surgical History:  Procedure Laterality Date   AMPUTATION Left 01/07/2018   Procedure: AMPUTATION  GREAT TOE LEFT FOOT;  Surgeon: Larina Earthly, MD;  Location: [facility] OR;  Service: Vascular;  Laterality: Left;   AORTA - BILATERAL FEMORAL ARTERY BYPASS GRAFT N/A 12/03/2017   Procedure: AORTA BIFEMORAL BYPASS GRAFT USING 17X7 MM X 40CM HEMASHIELD GOLD GRAFT REIMPLANTATION IMA;  Surgeon: Nada Libman, MD;  Location: [facility] OR;  Service: Vascular;  Laterality: N/A;   APPLICATION OF WOUND VAC Bilateral 12/03/2017   Procedure: APPLICATION OF WOUND VAC;  Surgeon: Nada Libman, MD;  Location: [facility] OR;  Service: Vascular;  Laterality: Bilateral;   BACK SURGERY     lumbar area   COLONOSCOPY     LEFT HEART CATH AND CORONARY ANGIOGRAPHY N/A 12/01/2017   Procedure: LEFT HEART CATH AND CORONARY ANGIOGRAPHY;  Surgeon: Elder Negus, MD;  Location: [facility] INV[facility] CV LAB;  Service: Cardiovascular;  Laterality: N/A;   TOTAL HIP ARTHROPLASTY     bil   Family History  Problem Relation Age of Onset   Cancer Mother    Social History   Socioeconomic History   Marital status: Single    Spouse name: Not on file   Number of children:  Not on file   Years of education: Not on file   Highest education level: Not on file  Occupational History   Not on file  Tobacco Use   Smoking status: Every Day    Packs/day: 0.25    Years: 50.00    Pack years: 12.50    Types: Cigarettes   Smokeless tobacco: Never   Tobacco comments:    A little less than 1/2 pk per day.   Vaping Use   Vaping Use: Never used  Substance and Sexual Activity   Alcohol use: No   Drug use: Yes    Types: Marijuana   Sexual activity: Not on file  Other Topics Concern   Not on file  Social History Narrative   Not on file   Social Determinants of Health   Financial Resource Strain: Not on file  Food  Insecurity: Not on file  Transportation Needs: Not on file  Physical Activity: Not on file  Stress: Not on file  Social Connections: Not on file    Tobacco Counseling Ready to quit: Not Answered Counseling given: Not Answered Tobacco comments: A little less than 1/2 pk per day.    Clinical Intake:                 Diabetic?***         Activities of Daily Living No flowsheet data found.  Patient Care Team: Grayce Sessions, NP as PCP - General (Internal Medicine)  Indicate any recent Medical Services you may have received from other than [facility] providers in the past year (date may be approximate).     Assessment:   This is a routine wellness examination for Austin [name].  Hearing/Vision screen No results found.  Dietary issues and exercise activities discussed:     Goals Addressed   None   Depression Screen PHQ 2/9 Scores 04/26/2019 11/02/2018 08/03/2018 06/22/2018 03/30/2018 03/02/2018  PHQ - 2 Score 0 0 0 0 0 0    Fall Risk Fall Risk  04/26/2019 11/02/2018 08/03/2018 03/02/2018  Falls in the past year? 0 0 1 No  Injury with Fall? - - 0 -    FALL RISK PREVENTION PERTAINING TO THE HOME:  Any stairs in or around the home? {YES/NO:21197} If so, are there any without handrails? {YES/NO:21197} Home free of loose throw rugs in walkways, pet beds, electrical cords, etc? {YES/NO:21197} Adequate lighting in your home to reduce risk of falls? {YES/NO:21197}  ASSISTIVE DEVICES UTILIZED TO PREVENT FALLS:  Life alert? {YES/NO:21197} Use of a cane, walker or w/c? {YES/NO:21197} Grab bars in the bathroom? {YES/NO:21197} Shower chair or bench in shower? {YES/NO:21197} Elevated toilet seat or a handicapped toilet? {YES/NO:21197}  TIMED UP AND GO:  Was the test performed? {YES/NO:21197}.  Length of time to ambulate 10 feet: *** sec.   {Appearance of HYWV:3710626}  Cognitive Function:        Immunizations  There is no immunization history on file for this  patient.  {TDAP status:2101805}  {Flu Vaccine status:2101806}  {Pneumococcal vaccine status:2101807}  {Covid-19 vaccine status:2101808}  Qualifies for Shingles Vaccine? {YES/NO:21197}  Zostavax completed {YES/NO:21197}  {Shingrix Completed?:2101804}  Screening Tests Health Maintenance  Topic Date Due   COVID-19 Vaccine (1) Never done   Zoster Vaccines- Shingrix (1 of 2) Never done   PNA vac Low Risk Adult (1 of 2 - PCV13) Never done   TETANUS/TDAP  02/15/2020   Fecal DNA (Cologuard)  04/01/2021   INFLUENZA VACCINE  04/16/2021   Hepatitis C Screening  Completed  HPV VACCINES  Aged Out    Health Maintenance  Health Maintenance Due  Topic Date Due   COVID-19 Vaccine (1) Never done   Zoster Vaccines- Shingrix (1 of 2) Never done   PNA vac Low Risk Adult (1 of 2 - PCV13) Never done   TETANUS/TDAP  02/15/2020   Fecal DNA (Cologuard)  04/01/2021   INFLUENZA VACCINE  04/16/2021    {Colorectal cancer screening:2101809}  Lung Cancer Screening: (Low Dose CT Chest recommended if Age 66-80 years, 30 pack-year currently smoking OR have quit w/in 15years.) {DOES NOT does:27190::"does not"} qualify.   Lung Cancer Screening Referral: ***  Additional Screening:  Hepatitis C Screening: {DOES NOT does:27190::"does not"} qualify; Completed ***  Vision Screening: Recommended annual ophthalmology exams for early detection of glaucoma and other disorders of the eye. Is the patient up to date with their annual eye exam?  {YES/NO:21197} Who is the provider or what is the name of the office in which the patient attends annual eye exams? *** If pt is not established with a provider, would they like to be referred to a provider to establish care? {YES/NO:21197}.   Dental Screening: Recommended annual dental exams for proper oral hygiene  Community Resource Referral / Chronic Care Management: CRR required this visit?  {YES/NO:21197}  CCM required this visit?  {YES/NO:21197}      Plan:     I have personally reviewed and noted the following in the patient's chart:   Medical and social history Use of alcohol, tobacco or illicit drugs  Current medications and supplements including opioid prescriptions. {Opioid Prescriptions:[phone]} Functional ability and status Nutritional status Physical activity Advanced directives List of other physicians Hospitalizations, surgeries, and ER visits in previous 12 months Vitals Screenings to include cognitive, depression, and falls Referrals and appointments  In addition, I have reviewed and discussed with patient certain preventive protocols, quality metrics, and best practice recommendations. A written personalized care plan for preventive services as well as general preventive health recommendations were provided to patient.     Tilford Pillar, CMA   05/12/2021   Nurse Notes:  Mr. Parson , Thank you for taking time to come for your Medicare Wellness Visit. I appreciate your ongoing commitment to your health goals. Please review the following plan we discussed and let me know if I can assist you in the future.   These are the goals we discussed:  Goals   None     This is a list of the screening recommended for you and due dates:  Health Maintenance  Topic Date Due   COVID-19 Vaccine (1) Never done   Zoster (Shingles) Vaccine (1 of 2) Never done   Pneumonia vaccines (1 of 2 - PCV13) Never done   Tetanus Vaccine  02/15/2020   Cologuard (Stool DNA test)  04/01/2021   Flu Shot  04/16/2021   Hepatitis C Screening: USPSTF Recommendation to screen - Ages 18-79 yo.  Completed   HPV Vaccine  Aged Out

## 2021-05-12 NOTE — Patient Instructions (Signed)
Health Maintenance, Male Adopting a healthy lifestyle and getting preventive care are important in promoting health and wellness. Ask your health care provider about: The right schedule for you to have regular tests and exams. Things you can do on your own to prevent diseases and keep yourself healthy. What should I know about diet, weight, and exercise? Eat a healthy diet  Eat a diet that includes plenty of vegetables, fruits, low-fat dairy products, and lean protein. Do not eat a lot of foods that are high in solid fats, added sugars, or sodium.  Maintain a healthy weight Body mass index (BMI) is a measurement that can be used to identify possible weight problems. It estimates body fat based on height and weight. Your health care provider can help determine your BMI and help you achieve or maintain ahealthy weight. Get regular exercise Get regular exercise. This is one of the most important things you can do for your health. Most adults should: Exercise for at least 150 minutes each week. The exercise should increase your heart rate and make you sweat (moderate-intensity exercise). Do strengthening exercises at least twice a week. This is in addition to the moderate-intensity exercise. Spend less time sitting. Even light physical activity can be beneficial. Watch cholesterol and blood lipids Have your blood tested for lipids and cholesterol at 68 years of age, then havethis test every 5 years. You may need to have your cholesterol levels checked more often if: Your lipid or cholesterol levels are high. You are older than 68 years of age. You are at high risk for heart disease. What should I know about cancer screening? Many types of cancers can be detected early and may often be prevented. Depending on your health history and family history, you may need to have cancer screening at various ages. This may include screening for: Colorectal cancer. Prostate cancer. Skin cancer. Lung  cancer. What should I know about heart disease, diabetes, and high blood pressure? Blood pressure and heart disease High blood pressure causes heart disease and increases the risk of stroke. This is more likely to develop in people who have high blood pressure readings, are of African descent, or are overweight. Talk with your health care provider about your target blood pressure readings. Have your blood pressure checked: Every 3-5 years if you are 18-39 years of age. Every year if you are 40 years old or older. If you are between the ages of 65 and 75 and are a current or former smoker, ask your health care provider if you should have a one-time screening for abdominal aortic aneurysm (AAA). Diabetes Have regular diabetes screenings. This checks your fasting blood sugar level. Have the screening done: Once every three years after age 45 if you are at a normal weight and have a low risk for diabetes. More often and at a younger age if you are overweight or have a high risk for diabetes. What should I know about preventing infection? Hepatitis B If you have a higher risk for hepatitis B, you should be screened for this virus. Talk with your health care provider to find out if you are at risk forhepatitis B infection. Hepatitis C Blood testing is recommended for: Everyone born from 1945 through 1965. Anyone with known risk factors for hepatitis C. Sexually transmitted infections (STIs) You should be screened each year for STIs, including gonorrhea and chlamydia, if: You are sexually active and are younger than 68 years of age. You are older than 68 years of age   and your health care provider tells you that you are at risk for this type of infection. Your sexual activity has changed since you were last screened, and you are at increased risk for chlamydia or gonorrhea. Ask your health care provider if you are at risk. Ask your health care provider about whether you are at high risk for HIV.  Your health care provider may recommend a prescription medicine to help prevent HIV infection. If you choose to take medicine to prevent HIV, you should first get tested for HIV. You should then be tested every 3 months for as long as you are taking the medicine. Follow these instructions at home: Lifestyle Do not use any products that contain nicotine or tobacco, such as cigarettes, e-cigarettes, and chewing tobacco. If you need help quitting, ask your health care provider. Do not use street drugs. Do not share needles. Ask your health care provider for help if you need support or information about quitting drugs. Alcohol use Do not drink alcohol if your health care provider tells you not to drink. If you drink alcohol: Limit how much you have to 0-2 drinks a day. Be aware of how much alcohol is in your drink. In the [place] one drink equals one 12 oz bottle of beer (355 mL), one 5 oz glass of wine (148 mL), or one 1 oz glass of hard liquor (44 mL). General instructions Schedule regular health, dental, and eye exams. Stay current with your vaccines. Tell your health care provider if: You often feel depressed. You have ever been abused or do not feel safe at home. Summary Adopting a healthy lifestyle and getting preventive care are important in promoting health and wellness. Follow your health care provider's instructions about healthy diet, exercising, and getting tested or screened for diseases. Follow your health care provider's instructions on monitoring your cholesterol and blood pressure. This information is not intended to replace advice given to you by your health care provider. Make sure you discuss any questions you have with your healthcare provider. Document Revised: 08/26/2018 Document Reviewed: 08/26/2018 Elsevier Patient Education  2022 Elsevier Inc.  

## 2021-06-04 NOTE — Addendum Note (Signed)
Addended by: Victorio Palm on: 06/04/2021 02:07 PM   Modules accepted: Level of Service

## 2021-06-22 LAB — COLOGUARD: COLOGUARD: POSITIVE — AB

## 2021-07-26 ENCOUNTER — Other Ambulatory Visit: Payer: Self-pay | Admitting: Family

## 2021-07-26 DIAGNOSIS — Z122 Encounter for screening for malignant neoplasm of respiratory organs: Secondary | ICD-10-CM

## 2021-07-26 DIAGNOSIS — F17210 Nicotine dependence, cigarettes, uncomplicated: Secondary | ICD-10-CM

## 2021-08-17 ENCOUNTER — Other Ambulatory Visit (INDEPENDENT_AMBULATORY_CARE_PROVIDER_SITE_OTHER): Payer: Self-pay | Admitting: Primary Care

## 2021-08-17 DIAGNOSIS — I10 Essential (primary) hypertension: Secondary | ICD-10-CM

## 2021-10-24 ENCOUNTER — Other Ambulatory Visit: Payer: Self-pay

## 2021-10-24 DIAGNOSIS — I6523 Occlusion and stenosis of bilateral carotid arteries: Secondary | ICD-10-CM

## 2021-10-29 ENCOUNTER — Other Ambulatory Visit: Payer: Self-pay

## 2021-10-29 ENCOUNTER — Encounter: Payer: Self-pay | Admitting: Surgery

## 2021-10-29 ENCOUNTER — Ambulatory Visit (HOSPITAL_COMMUNITY)
Admission: RE | Admit: 2021-10-29 | Discharge: 2021-10-29 | Disposition: A | Discharge status: Home / Self Care | Payer: Medicare HMO | Source: Ambulatory Visit | Attending: Surgery | Admitting: Surgery

## 2021-10-29 ENCOUNTER — Ambulatory Visit (INDEPENDENT_AMBULATORY_CARE_PROVIDER_SITE_OTHER): Payer: Medicare HMO | Admitting: Surgery

## 2021-10-29 VITALS — BP 145/82 | HR 66 | Temp 98.2°F | Resp 18 | Ht 67.0 in | Wt 201.0 lb

## 2021-10-29 DIAGNOSIS — I6523 Occlusion and stenosis of bilateral carotid arteries: Secondary | ICD-10-CM

## 2021-10-29 DIAGNOSIS — I70213 Atherosclerosis of native arteries of extremities with intermittent claudication, bilateral legs: Secondary | ICD-10-CM

## 2021-10-29 NOTE — Progress Notes (Signed)
Vascular and Vein Specialist of [facility]  Patient name: Austin [name] MRN: [id number] DOB: 07-03-1951 Sex: male   REASON FOR VISIT:    Follow up  HISOTRY OF PRESENT ILLNESS:    Austin [name] is a 71 y.o. 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  This was done for a gangrenous left great toe which had been present for approximately 2 months.  We elected to let the toe demarcate and see if it had an option for improving after his bypass surgery.  Unfortunately, his toe did not improve and on 01-07-2018, he underwent left great toe amputation.  There was good bleeding at the time of the operation.   He continues to walk long distances without cramping.  He does state that for the past 6 months he has been having some numbness in his leg which does not occur every day more periodically.  It happens when he stands up.  He does have a history of lower back surgery.  He is also followed for asymptomatic carotid stenosis.  He continues to be without numbness or weakness in either extremity, slurred speech, or amaurosis fugax.  He is medically managed for hypertension with an ARB.  He takes a statin for hypercholesterolemia.  He is a smoker.  PAST MEDICAL HISTORY:   Past Medical History:  Diagnosis Date   Back pain    Hypertension      FAMILY HISTORY:   Family History  Problem Relation Age of Onset   Cancer Mother     SOCIAL HISTORY:   Social History   Tobacco Use   Smoking status: Every Day    Packs/day: 0.25    Years: 50.00    Pack years: 12.50    Types: Cigarettes   Smokeless tobacco: Never   Tobacco comments:    A little less than 1/2 pk per day.   Substance Use Topics   Alcohol use: No     ALLERGIES:   No Known Allergies   CURRENT MEDICATIONS:   Current Outpatient Medications  Medication Sig Dispense Refill   amLODipine (NORVASC) 10 MG tablet Take 1 tablet (10 mg total) by mouth daily. 90 tablet 1    atorvastatin (LIPITOR) 20 MG tablet Take 1 tablet (20 mg total) by mouth daily. 90 tablet 1   gabapentin (NEURONTIN) 800 MG tablet Take 1 tablet (800 mg total) by mouth 3 (three) times daily. 270 tablet 1   hydrochlorothiazide (HYDRODIURIL) 25 MG tablet Take 1 tablet (25 mg total) by mouth daily. 90 tablet 3   ibuprofen (ADVIL) 600 MG tablet Take 1 tablet (600 mg total) by mouth every 8 (eight) hours as needed. 30 tablet 0   losartan (COZAAR) 50 MG tablet Take 1 tablet (50 mg total) by mouth daily. 90 tablet 3   metoprolol succinate (TOPROL-XL) 25 MG 24 hr tablet Take 1 tablet (25 mg total) by mouth daily. 90 tablet 1   tadalafil, PAH, (ADCIRCA) 20 MG tablet Take 1 tablet (20 mg total) by mouth as needed. 90 tablet 0   aspirin EC 81 MG tablet Take 1 tablet (81 mg total) by mouth daily. (Patient not taking: Reported on 10/29/2021) 90 tablet 3   No current facility-administered medications for this visit.    REVIEW OF SYSTEMS:   [X]  denotes positive finding, [ ]  denotes negative finding Cardiac  Comments:  Chest pain or chest pressure:    Shortness of breath upon exertion:    Short of breath when lying flat:  Irregular heart rhythm:        Vascular    Pain in calf, thigh, or hip brought on by ambulation:    Pain in feet at night that wakes you up from your sleep:     Blood clot in your veins:    Leg swelling:         Pulmonary    Oxygen at home:    Productive cough:     Wheezing:         Neurologic    Sudden weakness in arms or legs:     Sudden numbness in arms or legs:     Sudden onset of difficulty speaking or slurred speech:    Temporary loss of vision in one eye:     Problems with dizziness:         Gastrointestinal    Blood in stool:     Vomited blood:         Genitourinary    Burning when urinating:     Blood in urine:        Psychiatric    Major depression:         Hematologic    Bleeding problems:    Problems with blood clotting too easily:        Skin     Rashes or ulcers:        Constitutional    Fever or chills:      PHYSICAL EXAM:   Vitals:   10/29/21 1338 10/29/21 1340  BP: (!) 151/89 (!) 145/82  Pulse: 67 66  Resp: 18   Temp: 98.2 F (36.8 C)   TempSrc: Temporal   SpO2: 94%   Weight: 201 lb (91.2 kg)   Height: 5\' 7"  (1.702 m)     GENERAL: The patient is a well-nourished male, in no acute distress. The vital signs are documented above. CARDIAC: There is a regular rate and rhythm.  VASCULAR: Pedal pulses are nonpalpable.  He has monophasic PT and DP Doppler signals. PULMONARY: Non-labored respirations ABDOMEN: Soft and non-tender.  No incisional hernia MUSCULOSKELETAL: There are no major deformities or cyanosis. NEUROLOGIC: No focal weakness or paresthesias are detected. SKIN: There are no ulcers or rashes noted. PSYCHIATRIC: The patient has a normal affect.  STUDIES:   I have reviewed the following studies:  Right Carotid: Velocities in the right ICA are consistent with a 40-59%                 stenosis.   Left Carotid: Velocities in the left ICA are consistent with a 1-39%  stenosis.   Vertebrals:  Bilateral vertebral arteries demonstrate antegrade flow.  Subclavians: Normal flow hemodynamics were seen in bilateral subclavian               arteries.  MEDICAL ISSUES:   PAD: ABIs were not checked today, however based off of my exam and history, I do not think they are significantly changed.  I think his leg symptoms are more neuropathic in origin.  His claudication symptoms are stable and likely not the culprit of his left thigh numbness.  He does not have any open wounds.  I will plan on rechecking ABIs in 1 year.  Carotid: He remains asymptomatic.  The right carotid has a 40-59% stenosis which is asymptomatic.  He will repeat studies in 1 year.  He knows to contact me should he have any change in his symptoms.  Continue strict blood pressure control and statin management for cholesterol  We had a long  discussion regarding the need for smoking cessation.  He appears motivated to stop.    Leia Alf, MD, FACS [facility] Vein Specialists of [facility] [phone] Pager [phone]

## 2021-12-10 ENCOUNTER — Other Ambulatory Visit: Payer: Self-pay | Admitting: Family

## 2021-12-10 ENCOUNTER — Other Ambulatory Visit (HOSPITAL_COMMUNITY): Payer: Self-pay | Admitting: Family

## 2021-12-10 DIAGNOSIS — F17210 Nicotine dependence, cigarettes, uncomplicated: Secondary | ICD-10-CM

## 2021-12-28 ENCOUNTER — Ambulatory Visit (HOSPITAL_COMMUNITY): Payer: Medicare HMO

## 2022-01-08 ENCOUNTER — Ambulatory Visit (HOSPITAL_COMMUNITY)
Admission: RE | Admit: 2022-01-08 | Discharge: 2022-01-08 | Disposition: A | Discharge status: Home / Self Care | Payer: Medicare HMO | Source: Ambulatory Visit | Attending: Family | Admitting: Family

## 2022-01-08 DIAGNOSIS — F17210 Nicotine dependence, cigarettes, uncomplicated: Secondary | ICD-10-CM | POA: Diagnosis present

## 2022-12-19 ENCOUNTER — Other Ambulatory Visit: Payer: Self-pay | Admitting: *Deleted

## 2022-12-19 DIAGNOSIS — I6523 Occlusion and stenosis of bilateral carotid arteries: Secondary | ICD-10-CM

## 2022-12-19 DIAGNOSIS — I70213 Atherosclerosis of native arteries of extremities with intermittent claudication, bilateral legs: Secondary | ICD-10-CM

## 2022-12-30 ENCOUNTER — Ambulatory Visit (INDEPENDENT_AMBULATORY_CARE_PROVIDER_SITE_OTHER)
Admission: RE | Admit: 2022-12-30 | Discharge: 2022-12-30 | Disposition: A | Discharge status: Home / Self Care | Payer: Medicare HMO | Source: Ambulatory Visit | Attending: Surgery | Admitting: Surgery

## 2022-12-30 ENCOUNTER — Ambulatory Visit (HOSPITAL_COMMUNITY)
Admission: RE | Admit: 2022-12-30 | Discharge: 2022-12-30 | Disposition: A | Discharge status: Home / Self Care | Payer: Medicare HMO | Source: Ambulatory Visit | Attending: Surgery | Admitting: Surgery

## 2022-12-30 ENCOUNTER — Encounter: Payer: Self-pay | Admitting: Surgery

## 2022-12-30 ENCOUNTER — Ambulatory Visit (INDEPENDENT_AMBULATORY_CARE_PROVIDER_SITE_OTHER): Payer: Medicare HMO | Admitting: Surgery

## 2022-12-30 VITALS — BP 171/90 | HR 59 | Temp 97.7°F | Resp 20 | Ht 67.0 in | Wt 201.9 lb

## 2022-12-30 DIAGNOSIS — I6523 Occlusion and stenosis of bilateral carotid arteries: Secondary | ICD-10-CM

## 2022-12-30 DIAGNOSIS — I70213 Atherosclerosis of native arteries of extremities with intermittent claudication, bilateral legs: Secondary | ICD-10-CM

## 2022-12-30 LAB — VAS US ABI WITH/WO TBI
Left ABI: 0.76
Right ABI: 0.71

## 2022-12-30 NOTE — Progress Notes (Signed)
Vascular and Vein Specialist of [facility]  Patient name: Austin [name] MRN: [id number] DOB: September 13, 1951 Sex: male   REASON FOR VISIT:    Follow up  HISOTRY OF PRESENT ILLNESS:    Austin [name] is a 72 y.o. 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  This was done for a gangrenous left great toe which had been present for approximately 2 months.  We elected to let the toe demarcate and see if it had an option for improving after his bypass surgery.  Unfortunately, his toe did not improve and on 01-07-2018, he underwent left great toe amputation.  There was good bleeding at the time of the operation.    He continues to walk long distances without cramping.  He does complain of a cough.   He is also followed for asymptomatic carotid stenosis.  He continues to be without numbness or weakness in either extremity, slurred speech, or amaurosis fugax.   He is medically managed for hypertension with an ARB.  He takes a statin for hypercholesterolemia.  He is a smoker.   PAST MEDICAL HISTORY:   Past Medical History:  Diagnosis Date   Back pain    Hypertension      FAMILY HISTORY:   Family History  Problem Relation Age of Onset   Cancer Mother     SOCIAL HISTORY:   Social History   Tobacco Use   Smoking status: Every Day    Packs/day: 1.00    Years: 50.00    Additional pack years: 0.00    Total pack years: 50.00    Types: Cigarettes   Smokeless tobacco: Never   Tobacco comments:    A little less than 1/2 pk per day.   Substance Use Topics   Alcohol use: No     ALLERGIES:   No Known Allergies   CURRENT MEDICATIONS:   Current Outpatient Medications  Medication Sig Dispense Refill   amLODipine (NORVASC) 10 MG tablet Take 1 tablet (10 mg total) by mouth daily. 90 tablet 1   atorvastatin (LIPITOR) 20 MG tablet Take 1 tablet (20 mg total) by mouth daily. 90 tablet 1   gabapentin (NEURONTIN) 800 MG tablet Take 1 tablet (800  mg total) by mouth 3 (three) times daily. 270 tablet 1   hydrochlorothiazide (HYDRODIURIL) 25 MG tablet Take 1 tablet (25 mg total) by mouth daily. 90 tablet 3   ibuprofen (ADVIL) 600 MG tablet Take 1 tablet (600 mg total) by mouth every 8 (eight) hours as needed. 30 tablet 0   losartan (COZAAR) 50 MG tablet Take 1 tablet (50 mg total) by mouth daily. 90 tablet 3   metoprolol succinate (TOPROL-XL) 25 MG 24 hr tablet Take 1 tablet (25 mg total) by mouth daily. 90 tablet 1   tadalafil, PAH, (ADCIRCA) 20 MG tablet Take 1 tablet (20 mg total) by mouth as needed. 90 tablet 0   No current facility-administered medications for this visit.    REVIEW OF SYSTEMS:    denotes positive finding,  denotes negative finding Cardiac  Comments:  Chest pain or chest pressure:    Shortness of breath upon exertion:    Short of breath when lying flat:    Irregular heart rhythm:        Vascular    Pain in calf, thigh, or hip brought on by ambulation:    Pain in feet at night that wakes you up from your sleep:     Blood clot in your veins:  Leg swelling:         Pulmonary    Oxygen at home:    Productive cough:     Wheezing:         Neurologic    Sudden weakness in arms or legs:     Sudden numbness in arms or legs:     Sudden onset of difficulty speaking or slurred speech:    Temporary loss of vision in one eye:     Problems with dizziness:         Gastrointestinal    Blood in stool:     Vomited blood:         Genitourinary    Burning when urinating:     Blood in urine:        Psychiatric    Major depression:         Hematologic    Bleeding problems:    Problems with blood clotting too easily:        Skin    Rashes or ulcers:        Constitutional    Fever or chills:      PHYSICAL EXAM:   Vitals:   12/30/22 1056 12/30/22 1058  BP: (!) 168/93 (!) 171/90  Pulse: (!) 59   Resp: 20   Temp: 97.7 F (36.5 C)   SpO2: 95%   Weight: 201 lb 14.4 oz (91.6 kg)   Height: 5\' 7"   (1.702 m)     GENERAL: The patient is a well-nourished male, in no acute distress. The vital signs are documented above. CARDIAC: There is a regular rate and rhythm.  VASCULAR: Nonpalpable pedal pulses PULMONARY: Non-labored respirations ABDOMEN: Soft and non-tender with normal pitched bowel sounds.  MUSCULOSKELETAL: Well-healed left great toe amputation NEUROLOGIC: No focal weakness or paresthesias are detected. SKIN: There are no ulcers or rashes noted. PSYCHIATRIC: The patient has a normal affect.  STUDIES:   I have reviewed the following: Carotid: Right Carotid: Velocities in the right ICA are consistent with a 1-39%  stenosis.   Left Carotid: Velocities in the left ICA are consistent with a 1-39%  stenosis.               Non-hemodynamically significant plaque <50% noted in the  CCA.   Vertebrals: Bilateral vertebral arteries demonstrate antegrade flow.  Subclavians: Normal flow hemodynamics were seen in bilateral subclavian               arteries.   +-------+-----------+-----------+------------+------------+  ABI/TBIToday's ABIToday's TBIPrevious ABIPrevious TBI  +-------+-----------+-----------+------------+------------+  Right 0.71       0.65       0.72        0             +-------+-----------+-----------+------------+------------+  Left  0.76       Amputated  0.60        Amputated     +-------+-----------+-----------+------------+------------+    MEDICAL ISSUES:   Carotid: He remains asymptomatic with minimal disease.  I will repeat his ultrasound in a year   PAD: ABIs remained stable.  He is asymptomatic.  He does regular walking.  I will repeat his ABIs in 1 year  Cough: The patient had a lung cancer screening CT scan a year ago.  Recommendation was to repeat it yearly.  I told him that he needs to reach out to Dr. Katrinka Blazing, his PCP to have this repeated since his cough appears to have changed   Durene Cal, IV, MD, FACS Vascular and  Vein  Specialists of [facility] [phone] Pager [phone]

## 2023-08-13 ENCOUNTER — Ambulatory Visit
Admission: RE | Admit: 2023-08-13 | Discharge: 2023-08-13 | Disposition: A | Discharge status: Home / Self Care | Payer: Medicare HMO | Source: Ambulatory Visit | Attending: Family

## 2023-08-13 ENCOUNTER — Other Ambulatory Visit: Payer: Self-pay | Admitting: Family

## 2023-08-13 DIAGNOSIS — R058 Other specified cough: Secondary | ICD-10-CM

## 2024-03-03 ENCOUNTER — Other Ambulatory Visit: Payer: Self-pay

## 2024-03-03 DIAGNOSIS — I70213 Atherosclerosis of native arteries of extremities with intermittent claudication, bilateral legs: Secondary | ICD-10-CM

## 2024-03-03 DIAGNOSIS — I6523 Occlusion and stenosis of bilateral carotid arteries: Secondary | ICD-10-CM

## 2024-03-12 ENCOUNTER — Encounter: Payer: Self-pay | Admitting: Physician Assistant

## 2024-03-15 ENCOUNTER — Ambulatory Visit: Admitting: Surgery

## 2024-03-15 ENCOUNTER — Ambulatory Visit (HOSPITAL_COMMUNITY): Admission: RE | Admit: 2024-03-15 | Source: Ambulatory Visit

## 2024-04-09 ENCOUNTER — Other Ambulatory Visit: Payer: Self-pay | Admitting: Surgery

## 2024-04-09 DIAGNOSIS — I6523 Occlusion and stenosis of bilateral carotid arteries: Secondary | ICD-10-CM

## 2024-04-09 DIAGNOSIS — I70213 Atherosclerosis of native arteries of extremities with intermittent claudication, bilateral legs: Secondary | ICD-10-CM

## 2024-05-03 ENCOUNTER — Encounter (HOSPITAL_COMMUNITY)

## 2024-05-03 ENCOUNTER — Ambulatory Visit: Admitting: Surgery

## 2024-05-04 NOTE — Progress Notes (Unsigned)
 Ellouise Console, PA-C [address] [place], [place]  [address] Phone: [phone]   Gastroenterology Consultation  Referring Provider:     Claudene Prentice DELENA Mickey., FNP Primary Care Physician:  Claudene Prentice DELENA Mickey., FNP Primary Gastroenterologist:  Ellouise Console, PA-C / Lynnie Bring, MD  Reason for Consultation:     Positive Cologuard        HPI:   Austin [name] is a 73 y.o. y/o male referred for consultation & management  by Claudene Prentice DELENA Mickey., FNP.  He is here to evaluate positive Cologuard.  He is requesting Dr. Bring.  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  Past Medical History:  Diagnosis Date   Back pain    Hypertension     Past Surgical History:  Procedure Laterality Date   AMPUTATION Left 01/07/2018   Procedure: AMPUTATION  GREAT TOE LEFT FOOT;  Surgeon: Oris Krystal FALCON, MD;  Location: [facility] OR;  Service: Vascular;  Laterality: Left;   AORTA - BILATERAL FEMORAL ARTERY BYPASS GRAFT N/A 12/03/2017   Procedure: AORTA BIFEMORAL BYPASS GRAFT USING 17X7 MM X 40CM HEMASHIELD GOLD GRAFT REIMPLANTATION IMA;  Surgeon: Serene Gaile ORN, MD;  Location: [facility] OR;  Service: Vascular;  Laterality: N/A;   APPLICATION OF WOUND VAC Bilateral 12/03/2017   Procedure: APPLICATION OF WOUND VAC;  Surgeon: Serene Gaile ORN, MD;  Location: [facility] OR;  Service: Vascular;  Laterality: Bilateral;   BACK SURGERY     lumbar area   COLONOSCOPY     LEFT HEART CATH AND CORONARY ANGIOGRAPHY N/A 12/01/2017   Procedure: LEFT HEART CATH AND CORONARY ANGIOGRAPHY;  Surgeon: Elmira Newman PARAS, MD;  Location: [facility] INVASIVE CV LAB;  Service: Cardiovascular;  Laterality: N/A;   TOTAL HIP ARTHROPLASTY     bil    Prior to Admission medications   Medication Sig Start Date End Date Taking? Authorizing Provider  amLODipine  (NORVASC ) 10 MG tablet Take 1 tablet (10 mg  total) by mouth daily. 04/26/19   Celestia Rosaline SQUIBB, NP  atorvastatin  (LIPITOR ) 20 MG tablet Take 1 tablet (20 mg total) by mouth daily. 04/26/19   Celestia Rosaline SQUIBB, NP  gabapentin  (NEURONTIN ) 800 MG tablet Take 1 tablet (800 mg total) by mouth 3 (three) times daily. 04/26/19   Celestia Rosaline SQUIBB, NP  hydrochlorothiazide  (HYDRODIURIL ) 25 MG tablet Take 1 tablet (25 mg total) by mouth daily. 04/26/19   Celestia Rosaline SQUIBB, NP  ibuprofen  (ADVIL ) 600 MG tablet Take 1 tablet (600 mg total) by mouth every 8 (eight) hours as needed. 03/23/19   Celestia Rosaline SQUIBB, NP  losartan  (COZAAR ) 50 MG tablet Take 1 tablet (50 mg total) by mouth daily. 04/26/19   Celestia Rosaline SQUIBB, NP  metoprolol  succinate (TOPROL -XL) 25 MG 24 hr tablet Take 1 tablet (25 mg total) by mouth daily. 04/26/19   Celestia Rosaline SQUIBB, NP  tadalafil , PAH, (ADCIRCA ) 20 MG tablet Take 1 tablet (20 mg total) by mouth as needed. 03/18/19   Celestia Rosaline SQUIBB, NP    Family History  Problem Relation Age of Onset   Cancer Mother      Social History   Tobacco Use   Smoking status: Every Day    Current packs/day: 1.00    Average packs/day: 1 pack/day for 50.0 years (50.0 ttl pk-yrs)    Types: Cigarettes   Smokeless tobacco: Never  Tobacco comments:    A little less than 1/2 pk per day.   Vaping Use   Vaping status: Never Used  Substance Use Topics   Alcohol use: No   Drug use: Yes    Types: Marijuana    Allergies as of 05/05/2024   (No Known Allergies)    Review of Systems:    All systems reviewed and negative except where noted in HPI.   Physical Exam:  There were no vitals taken for this visit. No LMP for male patient.  General:   Alert,  Well-developed, well-nourished, pleasant and cooperative in NAD Lungs:  Respirations even and unlabored.  Clear throughout to auscultation.   No wheezes, crackles, or rhonchi. No acute distress. Heart:  Regular rate and rhythm; no murmurs, clicks, rubs, or gallops. Abdomen:  Normal  bowel sounds.  No bruits.  Soft, and non-distended without masses, hepatosplenomegaly or hernias noted.  No Tenderness.  No guarding or rebound tenderness.    Neurologic:  Alert and oriented x3;  grossly normal neurologically. Psych:  Alert and cooperative. Normal mood and affect.  Imaging Studies: No results found.  Labs: CBC    Component Value Date/Time   WBC 7.5 12/31/2017 1049   RBC 4.60 12/31/2017 1049   HGB 13.2 12/31/2017 1049   HCT 40.4 12/31/2017 1049   PLT 242 12/31/2017 1049   MCV 87.8 12/31/2017 1049    CMP     Component Value Date/Time   NA 142 04/26/2019 1057   K 4.2 04/26/2019 1057   CL 108 (H) 04/26/2019 1057   CO2 20 04/26/2019 1057   GLUCOSE 83 04/26/2019 1057   GLUCOSE 94 12/31/2017 1049   BUN 13 04/26/2019 1057   CREATININE 0.95 04/26/2019 1057   CALCIUM  9.2 04/26/2019 1057   PROT 6.5 04/26/2019 1057   ALBUMIN  4.0 04/26/2019 1057   AST 16 04/26/2019 1057   ALT 13 04/26/2019 1057   ALKPHOS 95 04/26/2019 1057   BILITOT 0.3 04/26/2019 1057   GFRNONAA 83 04/26/2019 1057   GFRAA 96 04/26/2019 1057    Assessment and Plan:   Everard Interrante is a 73 y.o. y/o male has been referred for ***  Follow up ***  Ellouise Console, PA-C

## 2024-05-05 ENCOUNTER — Ambulatory Visit (INDEPENDENT_AMBULATORY_CARE_PROVIDER_SITE_OTHER): Admitting: Physician Assistant

## 2024-05-05 ENCOUNTER — Encounter: Payer: Self-pay | Admitting: Physician Assistant

## 2024-05-05 VITALS — BP 140/80 | HR 73 | Ht 65.75 in | Wt 197.1 lb

## 2024-05-05 DIAGNOSIS — K5900 Constipation, unspecified: Secondary | ICD-10-CM | POA: Diagnosis not present

## 2024-05-05 DIAGNOSIS — R195 Other fecal abnormalities: Secondary | ICD-10-CM | POA: Diagnosis not present

## 2024-05-05 NOTE — Patient Instructions (Addendum)
   For constipation: Start OTC Miralax Powder Mix 1 capful in 6 to 8 ounces of a drink once daily  Recommend high-fiber diet, 30 g of fiber daily Eat fruits, vegetables, and whole grains Drink 64 ounces of water / fluids daily.   You have been scheduled for a Colonoscopy. Please follow written instructions given to you at your visit today.   If you use inhalers (even only as needed), please bring them with you on the day of your procedure.  DO NOT TAKE 7 DAYS PRIOR TO TEST- Trulicity (dulaglutide) Ozempic, Wegovy (semaglutide) Mounjaro (tirzepatide) Bydureon Bcise (exanatide extended release)  DO NOT TAKE 1 DAY PRIOR TO YOUR TEST Rybelsus (semaglutide) Adlyxin (lixisenatide) Victoza (liraglutide) Byetta (exanatide) ___________________________________________________________________________  Please follow up sooner if symptoms increase or worsen   Due to recent changes in healthcare laws, you may see the results of your imaging and laboratory studies on MyChart before your provider has had a chance to review them.  We understand that in some cases there may be results that are confusing or concerning to you. Not all laboratory results come back in the same time frame and the provider may be waiting for multiple results in order to interpret others.  Please give us  48 hours in order for your provider to thoroughly review all the results before contacting the office for clarification of your results.   Thank you for trusting me with your gastrointestinal care!   Ellouise Console, PA-C _______________________________________________________  If your blood pressure at your visit was 140/90 or greater, please contact your primary care physician to follow up on this.  _______________________________________________________  If you are age 68 or older, your body mass index should be between 23-30. Your Body mass index is 32.06 kg/m. If this is out of the aforementioned range listed, please  consider follow up with your Primary Care Provider.  If you are age 84 or younger, your body mass index should be between 19-25. Your Body mass index is 32.06 kg/m. If this is out of the aformentioned range listed, please consider follow up with your Primary Care Provider.   ________________________________________________________  The [facility] providers would like to encourage you to use MYCHART to communicate with providers for non-urgent requests or questions.  Due to long hold times on the telephone, sending your provider a message by [facility] may be a faster and more efficient way to get a response.  Please allow 48 business hours for a response.  Please remember that this is for non-urgent requests.  _______________________________________________________

## 2024-05-07 NOTE — Addendum Note (Signed)
 Addended by: LANETTE ALETHEA CROME on: 05/07/2024 09:50 AM   Modules accepted: Orders

## 2024-05-07 NOTE — Progress Notes (Signed)
 Agree with the assessment and plan as outlined by Brigitte Canard, PA-C.

## 2024-06-02 ENCOUNTER — Other Ambulatory Visit: Payer: Self-pay | Admitting: Nurse Practitioner

## 2024-06-02 ENCOUNTER — Telehealth: Payer: Self-pay | Admitting: Physician Assistant

## 2024-06-02 ENCOUNTER — Ambulatory Visit
Admission: RE | Admit: 2024-06-02 | Discharge: 2024-06-02 | Disposition: A | Discharge status: Home / Self Care | Source: Ambulatory Visit | Attending: Nurse Practitioner | Admitting: Nurse Practitioner

## 2024-06-02 DIAGNOSIS — M25532 Pain in left wrist: Secondary | ICD-10-CM

## 2024-06-02 DIAGNOSIS — M25531 Pain in right wrist: Secondary | ICD-10-CM

## 2024-06-02 MED ORDER — NA SULFATE-K SULFATE-MG SULF 17.5-3.13-1.6 GM/177ML PO SOLN: 1.0000 | Freq: Once | ORAL | 0 refills | Status: AC

## 2024-06-02 NOTE — Telephone Encounter (Signed)
 Suprep sent to [facility], [employer]

## 2024-06-02 NOTE — Telephone Encounter (Signed)
 Patient needing prep medication sent into the pharmacy. Please advise.   [facility]   Thank you

## 2024-06-07 ENCOUNTER — Encounter: Payer: Self-pay | Admitting: Surgery

## 2024-06-07 ENCOUNTER — Ambulatory Visit (HOSPITAL_COMMUNITY)
Admission: RE | Admit: 2024-06-07 | Discharge: 2024-06-07 | Disposition: A | Discharge status: Home / Self Care | Source: Ambulatory Visit | Attending: Surgery | Admitting: Surgery

## 2024-06-07 ENCOUNTER — Ambulatory Visit (HOSPITAL_BASED_OUTPATIENT_CLINIC_OR_DEPARTMENT_OTHER)
Admission: RE | Admit: 2024-06-07 | Discharge: 2024-06-07 | Disposition: A | Discharge status: Home / Self Care | Source: Ambulatory Visit | Attending: Surgery | Admitting: Surgery

## 2024-06-07 ENCOUNTER — Ambulatory Visit: Attending: Surgery | Admitting: Surgery

## 2024-06-07 VITALS — BP 158/94 | HR 54 | Wt 197.0 lb

## 2024-06-07 DIAGNOSIS — I6523 Occlusion and stenosis of bilateral carotid arteries: Secondary | ICD-10-CM

## 2024-06-07 DIAGNOSIS — I70213 Atherosclerosis of native arteries of extremities with intermittent claudication, bilateral legs: Secondary | ICD-10-CM

## 2024-06-07 LAB — VAS US ABI WITH/WO TBI
Left ABI: 0.55
Right ABI: 0.8

## 2024-06-07 NOTE — Progress Notes (Signed)
 [facility] Specialist of [facility], [place]  Patient name: Austin [name] MRN: [id number] DOB: Jul 05, 1951 Sex: male   REASON FOR VISIT:    Follow up  HISOTRY OF PRESENT ILLNESS:    Austin [name] is a 73 y.o. 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  This was done for a gangrenous left great toe which had been present for approximately 2 months.  We elected to let the toe demarcate and see if it had an option for improving after his bypass surgery.  Unfortunately, his toe did not improve and on 01-07-2018, he underwent left great toe amputation.  There was good bleeding at the time of the operation.    He denies claudication symptoms.  He is actually walking more.  He does have some sciatic type pain   He is also followed for asymptomatic carotid stenosis.  He continues to be without numbness or weakness in either extremity, slurred speech, or amaurosis fugax.   He is medically managed for hypertension with an ARB.  He takes a statin for hypercholesterolemia.  He is a smoker.   PAST MEDICAL HISTORY:   Past Medical History:  Diagnosis Date   Back pain    Hypertension      FAMILY HISTORY:   Family History  Problem Relation Age of Onset   Cancer Mother     SOCIAL HISTORY:   Social History   Tobacco Use   Smoking status: Every Day    Current packs/day: 1.00    Average packs/day: 1 pack/day for 50.0 years (50.0 ttl pk-yrs)    Types: Cigarettes   Smokeless tobacco: Never   Tobacco comments:    A little less than 1/2 pk per day.   Substance Use Topics   Alcohol use: No     ALLERGIES:   No Known Allergies   CURRENT MEDICATIONS:   Current Outpatient Medications  Medication Sig Dispense Refill   AMBULATORY NON FORMULARY MEDICATION Take 1 Dose by mouth daily as needed (Patient takes as needed). Medication Name: Austin [name]     amLODipine  (NORVASC ) 10 MG tablet Take 1 tablet (10 mg total) by mouth daily. 90 tablet 1    aspirin  EC 81 MG tablet Take 81 mg by mouth daily. Swallow whole.     atorvastatin  (LIPITOR ) 20 MG tablet Take 1 tablet (20 mg total) by mouth daily. 90 tablet 1   gabapentin  (NEURONTIN ) 800 MG tablet Take 1 tablet (800 mg total) by mouth 3 (three) times daily. 270 tablet 1   hydrochlorothiazide  (HYDRODIURIL ) 25 MG tablet Take 1 tablet (25 mg total) by mouth daily. 90 tablet 3   ibuprofen  (ADVIL ) 600 MG tablet Take 1 tablet (600 mg total) by mouth every 8 (eight) hours as needed. 30 tablet 0   ibuprofen  (ADVIL ) 800 MG tablet Take 800 mg by mouth daily.     losartan  (COZAAR ) 50 MG tablet Take 1 tablet (50 mg total) by mouth daily. 90 tablet 3   metoprolol  succinate (TOPROL -XL) 25 MG 24 hr tablet Take 1 tablet (25 mg total) by mouth daily. 90 tablet 1   tadalafil , PAH, (ADCIRCA ) 20 MG tablet Take 1 tablet (20 mg total) by mouth as needed. 90 tablet 0   No current facility-administered medications for this visit.    REVIEW OF SYSTEMS:   [X]  denotes positive finding, [ ]  denotes negative finding Cardiac  Comments:  Chest pain or chest pressure:    Shortness of breath upon exertion:    Short of breath when lying  flat:    Irregular heart rhythm:        Vascular    Pain in calf, thigh, or hip brought on by ambulation:    Pain in feet at night that wakes you up from your sleep:     Blood clot in your veins:    Leg swelling:         Pulmonary    Oxygen at home:    Productive cough:     Wheezing:         Neurologic    Sudden weakness in arms or legs:     Sudden numbness in arms or legs:     Sudden onset of difficulty speaking or slurred speech:    Temporary loss of vision in one eye:     Problems with dizziness:         Gastrointestinal    Blood in stool:     Vomited blood:         Genitourinary    Burning when urinating:     Blood in urine:        Psychiatric    Major depression:         Hematologic    Bleeding problems:    Problems with blood clotting too easily:         Skin    Rashes or ulcers:        Constitutional    Fever or chills:      PHYSICAL EXAM:   Vitals:   06/07/24 1140 06/07/24 1141  BP: (!) 191/84 (!) 158/94  Pulse: (!) 54   SpO2: 96%   Weight: 197 lb (89.4 kg)     GENERAL: The patient is a well-nourished male, in no acute distress. The vital signs are documented above. CARDIAC: There is a regular rate and rhythm.  VASCULAR: Palpable femoral pulses.  Nonpalpable pedal pulses PULMONARY: Non-labored respirations ABDOMEN: Soft and non-tender.  No hernia MUSCULOSKELETAL: There are no major deformities or cyanosis. NEUROLOGIC: No focal weakness or paresthesias are detected. SKIN: There are no ulcers or rashes noted. PSYCHIATRIC: The patient has a normal affect.  STUDIES:   I have reviewed the following: ABI/TBIToday's ABIToday's TBIPrevious ABIPrevious TBI  +-------+-----------+-----------+------------+------------+  Right 0.80       0.67       0.71        0.65          +-------+-----------+-----------+------------+------------+  Left  0.55       amputation 0.76        amputation    +-------+-----------+-----------+------------+------------+   Carotid: Right Carotid: Velocities in the right ICA are consistent with a 40-59%                 stenosis.   Left Carotid: Velocities in the left ICA are consistent with a 1-39%  stenosis.   Vertebrals: Bilateral vertebral arteries demonstrate antegrade flow.  Subclavians: Normal flow hemodynamics were seen in bilateral subclavian               arteries.   MEDICAL ISSUES:   Carotid: He remains asymptomatic with moderate stenosis particular on the right.  He will need surveillance imaging in 1 year  PAD: His ABIs are slightly down today but essentially stable.  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  He is completely asymptomatic.  I have encouraged him to continue walking.  He knows to contact me should he develop a nonhealing wound.  He will follow-up in 1 year with ABIs.   Within  the next couple years he will need an abdominal aortic ultrasound to evaluate for anastomotic stenosis or aneurysmal change.  This can be ordered at his next visit    Austin [name], IV, MD, FACS [facility] [phone] Pager [phone]

## 2024-06-09 ENCOUNTER — Encounter: Payer: Self-pay | Admitting: Gastroenterology

## 2024-06-09 ENCOUNTER — Ambulatory Visit: Admitting: Gastroenterology

## 2024-06-09 VITALS — BP 170/71 | HR 47 | Temp 97.9°F | Resp 13 | Ht 65.75 in | Wt 197.0 lb

## 2024-06-09 DIAGNOSIS — K621 Rectal polyp: Secondary | ICD-10-CM

## 2024-06-09 DIAGNOSIS — R195 Other fecal abnormalities: Secondary | ICD-10-CM | POA: Diagnosis not present

## 2024-06-09 DIAGNOSIS — K6389 Other specified diseases of intestine: Secondary | ICD-10-CM

## 2024-06-09 DIAGNOSIS — D123 Benign neoplasm of transverse colon: Secondary | ICD-10-CM

## 2024-06-09 DIAGNOSIS — D124 Benign neoplasm of descending colon: Secondary | ICD-10-CM

## 2024-06-09 DIAGNOSIS — D128 Benign neoplasm of rectum: Secondary | ICD-10-CM

## 2024-06-09 DIAGNOSIS — D120 Benign neoplasm of cecum: Secondary | ICD-10-CM

## 2024-06-09 DIAGNOSIS — K635 Polyp of colon: Secondary | ICD-10-CM | POA: Diagnosis not present

## 2024-06-09 DIAGNOSIS — D125 Benign neoplasm of sigmoid colon: Secondary | ICD-10-CM

## 2024-06-09 DIAGNOSIS — D122 Benign neoplasm of ascending colon: Secondary | ICD-10-CM

## 2024-06-09 DIAGNOSIS — K5730 Diverticulosis of large intestine without perforation or abscess without bleeding: Secondary | ICD-10-CM

## 2024-06-09 DIAGNOSIS — Z1211 Encounter for screening for malignant neoplasm of colon: Secondary | ICD-10-CM

## 2024-06-09 MED ORDER — SODIUM CHLORIDE 0.9 % IV SOLN
500.0000 mL | Freq: Once | INTRAVENOUS | Status: DC
Start: 2024-06-09 — End: 2024-06-09

## 2024-06-09 NOTE — Progress Notes (Signed)
 [facility] History and Physical   Primary Care Physician:  Claudene Prentice DELENA Mickey., FNP   Reason for Procedure:   Positive Cologuard  Plan:    Colonoscopy     HPI: Krishav Mamone is a 73 y.o. male undergoing colonoscopy following a positive Cologuard.  He has no family history of colon cancer and no chronic GI symptoms.  He reports having a normal colonoscopy many years ago in [place] .  Past Medical History:  Diagnosis Date   Back pain    Hypertension     Past Surgical History:  Procedure Laterality Date   AMPUTATION Left 01/07/2018   Procedure: AMPUTATION  GREAT TOE LEFT FOOT;  Surgeon: Oris Krystal FALCON, MD;  Location: [facility] OR;  Service: Vascular;  Laterality: Left;   AORTA - BILATERAL FEMORAL ARTERY BYPASS GRAFT N/A 12/03/2017   Procedure: AORTA BIFEMORAL BYPASS GRAFT USING 17X7 MM X 40CM HEMASHIELD GOLD GRAFT REIMPLANTATION IMA;  Surgeon: Serene Gaile ORN, MD;  Location: [facility] OR;  Service: Vascular;  Laterality: N/A;   APPLICATION OF WOUND VAC Bilateral 12/03/2017   Procedure: APPLICATION OF WOUND VAC;  Surgeon: Serene Gaile ORN, MD;  Location: [facility] OR;  Service: Vascular;  Laterality: Bilateral;   BACK SURGERY     lumbar area   COLONOSCOPY     LEFT HEART CATH AND CORONARY ANGIOGRAPHY N/A 12/01/2017   Procedure: LEFT HEART CATH AND CORONARY ANGIOGRAPHY;  Surgeon: Elmira Newman PARAS, MD;  Location: [facility] INVASIVE CV LAB;  Service: Cardiovascular;  Laterality: N/A;   TOTAL HIP ARTHROPLASTY     bil    Prior to Admission medications   Medication Sig Start Date End Date Taking? Authorizing Provider  amLODipine  (NORVASC ) 10 MG tablet Take 1 tablet (10 mg total) by mouth daily. 04/26/19  Yes Celestia Rosaline SQUIBB, NP  aspirin  EC 81 MG tablet Take 81 mg by mouth daily. Swallow whole.   Yes [provider]  gabapentin  (NEURONTIN ) 800 MG tablet Take 1 tablet (800 mg total) by mouth 3 (three) times daily. 04/26/19  Yes Celestia Rosaline SQUIBB, NP  ibuprofen  (ADVIL ) 600 MG tablet Take 1  tablet (600 mg total) by mouth every 8 (eight) hours as needed. 03/23/19  Yes Celestia Rosaline SQUIBB, NP  ibuprofen  (ADVIL ) 800 MG tablet Take 800 mg by mouth daily.   Yes [provider]  losartan  (COZAAR ) 50 MG tablet Take 1 tablet (50 mg total) by mouth daily. 04/26/19  Yes Celestia Rosaline SQUIBB, NP  metoprolol  succinate (TOPROL -XL) 25 MG 24 hr tablet Take 1 tablet (25 mg total) by mouth daily. 04/26/19  Yes Celestia Rosaline SQUIBB, NP  tadalafil , PAH, (ADCIRCA ) 20 MG tablet Take 1 tablet (20 mg total) by mouth as needed. 03/18/19  Yes Celestia Rosaline SQUIBB, NP  AMBULATORY NON FORMULARY MEDICATION Take 1 Dose by mouth daily as needed (Patient takes as needed). Medication Name: Orlando MOM    [provider]  atorvastatin  (LIPITOR ) 20 MG tablet Take 1 tablet (20 mg total) by mouth daily. 04/26/19   Celestia Rosaline SQUIBB, NP  hydrochlorothiazide  (HYDRODIURIL ) 25 MG tablet Take 1 tablet (25 mg total) by mouth daily. 04/26/19   Celestia Rosaline SQUIBB, NP    Current Outpatient Medications  Medication Sig Dispense Refill   amLODipine  (NORVASC ) 10 MG tablet Take 1 tablet (10 mg total) by mouth daily. 90 tablet 1   aspirin  EC 81 MG tablet Take 81 mg by mouth daily. Swallow whole.     gabapentin  (NEURONTIN ) 800 MG tablet Take 1 tablet (800  mg total) by mouth 3 (three) times daily. 270 tablet 1   ibuprofen  (ADVIL ) 600 MG tablet Take 1 tablet (600 mg total) by mouth every 8 (eight) hours as needed. 30 tablet 0   ibuprofen  (ADVIL ) 800 MG tablet Take 800 mg by mouth daily.     losartan  (COZAAR ) 50 MG tablet Take 1 tablet (50 mg total) by mouth daily. 90 tablet 3   metoprolol  succinate (TOPROL -XL) 25 MG 24 hr tablet Take 1 tablet (25 mg total) by mouth daily. 90 tablet 1   tadalafil , PAH, (ADCIRCA ) 20 MG tablet Take 1 tablet (20 mg total) by mouth as needed. 90 tablet 0   AMBULATORY NON FORMULARY MEDICATION Take 1 Dose by mouth daily as needed (Patient takes as needed). Medication Name: Orlando MOM      atorvastatin  (LIPITOR ) 20 MG tablet Take 1 tablet (20 mg total) by mouth daily. 90 tablet 1   hydrochlorothiazide  (HYDRODIURIL ) 25 MG tablet Take 1 tablet (25 mg total) by mouth daily. 90 tablet 3   Current Facility-Administered Medications  Medication Dose Route Frequency Provider Last Rate Last Admin   0.9 %  sodium chloride  infusion  500 mL Intravenous Once Stacia Glendia BRAVO, MD        Allergies as of 06/09/2024   (No Known Allergies)    Family History  Problem Relation Age of Onset   Cancer Mother     Social History   Socioeconomic History   Marital status: Single    Spouse name: Not on file   Number of children: Not on file   Years of education: Not on file   Highest education level: Not on file  Occupational History   Not on file  Tobacco Use   Smoking status: Every Day    Current packs/day: 1.00    Average packs/day: 1 pack/day for 50.0 years (50.0 ttl pk-yrs)    Types: Cigarettes   Smokeless tobacco: Never   Tobacco comments:    A little less than 1/2 pk per day.   Vaping Use   Vaping status: Never Used  Substance and Sexual Activity   Alcohol use: No   Drug use: Yes    Types: Marijuana   Sexual activity: Not on file  Other Topics Concern   Not on file  Social History Narrative   Not on file   Social Drivers of Health   Financial Resource Strain: Not on file  Food Insecurity: Not on file  Transportation Needs: Not on file  Physical Activity: Not on file  Stress: Not on file  Social Connections: Not on file  Intimate Partner Violence: Not on file    Review of Systems:  All other review of systems negative except as mentioned in the HPI.  Physical Exam: Vital signs BP 138/67   Pulse 73   Temp 97.9 F (36.6 C) (Temporal)   Ht 5' 5.75 (1.67 m)   Wt 197 lb (89.4 kg)   SpO2 98%   BMI 32.04 kg/m   General:   Alert,  Well-developed, well-nourished, pleasant and cooperative in NAD Airway:  Mallampati 2 Lungs:  Clear throughout to  auscultation.   Heart:  Regular rate and rhythm; no murmurs, clicks, rubs,  or gallops. Abdomen:  Soft, nontender and nondistended. Normal bowel sounds.   Neuro/Psych:  Normal mood and affect. A and O x 3   Traye Bates E. Stacia, MD [facility]

## 2024-06-09 NOTE — Progress Notes (Signed)
 Transferred to PACU via stretcher, arousing, VSS.

## 2024-06-09 NOTE — Progress Notes (Signed)
 Called to room to assist during endoscopic procedure.  Patient ID and intended procedure confirmed with present staff. Received instructions for my participation in the procedure from the performing physician.

## 2024-06-09 NOTE — Op Note (Signed)
 [facility] Patient Name: Austin [name] Procedure Date: 06/09/2024 1:23 PM MRN: [id number] Endoscopist: Glendia E. Stacia , MD, [id number] Age: 73 Referring MD:  Date of Birth: 08-20-1951 Gender: Male Account #: [id number] Procedure:                Colonoscopy Indications:              Screening for colorectal malignant neoplasm due to                            positive Cologuard test Medicines:                Monitored Anesthesia Care Procedure:                Pre-Anesthesia Assessment:                           - Prior to the procedure, a History and Physical                            was performed, and patient medications and                            allergies were reviewed. The patient's tolerance of                            previous anesthesia was also reviewed. The risks                            and benefits of the procedure and the sedation                            options and risks were discussed with the patient.                            All questions were answered, and informed consent                            was obtained. Prior Anticoagulants: The patient has                            taken no anticoagulant or antiplatelet agents. ASA                            Grade Assessment: III - A patient with severe                            systemic disease. After reviewing the risks and                            benefits, the patient was deemed in satisfactory                            condition to undergo the procedure.  After obtaining informed consent, the colonoscope                            was passed under direct vision. Throughout the                            procedure, the patient's blood pressure, pulse, and                            oxygen saturations were monitored continuously. The                            Olympus Scope SN: [id number] was introduced through                            the anus and advanced to the  the cecum, identified                            by appendiceal orifice and ileocecal valve. The                            colonoscopy was performed without difficulty. The                            patient tolerated the procedure well. The quality                            of the bowel preparation was excellent. The                            ileocecal valve, appendiceal orifice, and rectum                            were photographed. The bowel preparation used was                            SUPREP via split dose instruction. Scope In: 1:31:23 PM Scope Out: 2:09:17 PM Scope Withdrawal Time: 0 hours 31 minutes 39 seconds  Total Procedure Duration: 0 hours 37 minutes 54 seconds  Findings:                 The perianal and digital rectal examinations were                            normal. Pertinent negatives include normal                            sphincter tone and no palpable rectal lesions.                           A 4 mm polyp was found in the cecum. The polyp was                            sessile. The polyp was removed  with a cold snare.                            Resection and retrieval were complete. Estimated                            blood loss was minimal.                           Two sessile polyps were found in the ascending                            colon. The polyps were 3 to 4 mm in size. These                            polyps were removed with a cold snare. Resection                            and retrieval were complete. Estimated blood loss                            was minimal.                           Two sessile polyps were found in the transverse                            colon. The polyps were 3 to 5 mm in size. These                            polyps were removed with a cold snare. Resection                            and retrieval were complete. Estimated blood loss                            was minimal.                           A 7 mm polyp was  found in the transverse colon. The                            polyp was vermiform in morphology. The polyp was                            removed with a cold snare. Resection and retrieval                            were complete. Estimated blood loss was minimal.                           Many (>20) sessile polyps were found in the rectum,  sigmoid colon and descending colon. The polyps were                            2 to 15 mm in size. Seven of these polyps were                            removed with a cold snare. Resection and retrieval                            were complete. Estimated blood loss was minimal.                           One 6 mm nodule was found in the descending colon.                            This nodule was mobile when probed with forceps.                            Biopsies were taken with a cold forceps for                            histology. Estimated blood loss was minimal.                           Multiple medium-mouthed and small-mouthed                            diverticula were found in the sigmoid colon and                            descending colon.                           The exam was otherwise normal throughout the                            examined colon.                           The retroflexed view of the distal rectum and anal                            verge was normal and showed no anal or rectal                            abnormalities. Complications:            No immediate complications. Estimated Blood Loss:     Estimated blood loss was minimal. Impression:               - One 4 mm polyp in the cecum, removed with a cold                            snare. Resected and retrieved.                           -  Two 3 to 4 mm polyps in the ascending colon,                            removed with a cold snare. Resected and retrieved.                           - Two 3 to 5 mm polyps in the transverse colon,                             removed with a cold snare. Resected and retrieved.                           - One 7 mm vermiform polyp in the transverse colon,                            removed with a cold snare. Resected and retrieved.                           - Many 2 to 15 mm polyps in the rectum, in the                            sigmoid colon and in the descending colon, seven                            were removed with a cold snare. Resected and                            retrieved. These appeared consistent with                            hyperplastic polyps.                           - Small, mobile nodule in the descending colon.                            Biopsied. This was most consistent with a small                            lymph node. Further evaluation not likely needed                            but could consider CT abdomen/pelvis.                           - Moderate diverticulosis in the sigmoid colon and                            in the descending colon.                           - The distal rectum and anal verge are normal on  retroflexion view. Recommendation:           - Patient has a contact number available for                            emergencies. The signs and symptoms of potential                            delayed complications were discussed with the                            patient. Return to normal activities tomorrow.                            Written discharge instructions were provided to the                            patient.                           - Resume previous diet.                           - Continue present medications.                           - Await pathology results.                           - Repeat colonoscopy (date not yet determined) for                            surveillance based on pathology results.                           - Will discuss possible CT to further evaluate                             submucosal lesion in colon (suspected lymph node). Athaliah Baumbach E. Stacia, MD 06/09/2024 2:23:21 PM This report has been signed electronically.

## 2024-06-09 NOTE — Patient Instructions (Signed)

## 2024-06-10 ENCOUNTER — Telehealth: Payer: Self-pay | Admitting: *Deleted

## 2024-06-10 NOTE — Telephone Encounter (Signed)
 Attempted f/u phone call. No answer. Left message.

## 2024-06-14 LAB — SURGICAL PATHOLOGY

## 2024-06-18 ENCOUNTER — Ambulatory Visit: Payer: Self-pay | Admitting: Gastroenterology
